# Patient Record
Sex: Male | Born: 1960 | Race: White | Hispanic: No | Marital: Single | Smoking: Current every day smoker
Health system: Southern US, Community
[De-identification: ages and names within clinical notes are randomized; demographics above are authoritative.]

## PROBLEM LIST (undated history)

## (undated) DIAGNOSIS — F209 Schizophrenia, unspecified: Secondary | ICD-10-CM

## (undated) DIAGNOSIS — I10 Essential (primary) hypertension: Secondary | ICD-10-CM

## (undated) HISTORY — PX: AMPUTATION ARM: SHX6593

---

## 2004-06-10 ENCOUNTER — Emergency Department: Payer: Self-pay | Admitting: Emergency Medicine

## 2004-06-12 ENCOUNTER — Emergency Department: Payer: Self-pay | Admitting: Emergency Medicine

## 2004-07-19 ENCOUNTER — Emergency Department: Payer: Self-pay | Admitting: Emergency Medicine

## 2005-05-25 ENCOUNTER — Emergency Department: Payer: Self-pay | Admitting: Emergency Medicine

## 2005-05-27 ENCOUNTER — Ambulatory Visit: Payer: Self-pay | Admitting: Emergency Medicine

## 2006-04-22 ENCOUNTER — Emergency Department: Payer: Self-pay | Admitting: Emergency Medicine

## 2006-06-01 ENCOUNTER — Emergency Department: Payer: Self-pay | Admitting: Emergency Medicine

## 2006-09-11 ENCOUNTER — Emergency Department: Payer: Self-pay | Admitting: Emergency Medicine

## 2006-10-12 ENCOUNTER — Emergency Department: Payer: Self-pay | Admitting: Emergency Medicine

## 2006-12-30 ENCOUNTER — Emergency Department: Payer: Self-pay | Admitting: Emergency Medicine

## 2007-06-05 ENCOUNTER — Emergency Department: Payer: Self-pay | Admitting: Emergency Medicine

## 2007-06-29 ENCOUNTER — Emergency Department: Payer: Self-pay | Admitting: Emergency Medicine

## 2008-05-16 ENCOUNTER — Emergency Department: Payer: Self-pay | Admitting: Emergency Medicine

## 2008-05-26 ENCOUNTER — Inpatient Hospital Stay: Payer: Self-pay | Admitting: Psychiatry

## 2008-06-21 ENCOUNTER — Inpatient Hospital Stay: Payer: Self-pay | Admitting: Psychiatry

## 2008-08-05 ENCOUNTER — Emergency Department: Payer: Self-pay

## 2009-08-24 ENCOUNTER — Inpatient Hospital Stay: Payer: Self-pay | Admitting: Psychiatry

## 2009-08-31 ENCOUNTER — Inpatient Hospital Stay: Payer: Self-pay | Admitting: Psychiatry

## 2010-08-23 ENCOUNTER — Inpatient Hospital Stay: Payer: Self-pay | Admitting: Psychiatry

## 2011-09-09 LAB — COMPREHENSIVE METABOLIC PANEL
Albumin: 3.7 g/dL (ref 3.4–5.0)
Alkaline Phosphatase: 68 U/L (ref 50–136)
Anion Gap: 10 (ref 7–16)
Co2: 24 mmol/L (ref 21–32)
EGFR (Non-African Amer.): >60
Glucose: 159 mg/dL — ABNORMAL HIGH (ref 65–99)
SGOT(AST): 9 U/L — ABNORMAL LOW (ref 15–37)
SGPT (ALT): 15 U/L (ref 12–78)
Total Protein: 7 g/dL (ref 6.4–8.2)

## 2011-09-09 LAB — URINALYSIS, COMPLETE
Bacteria: NONE SEEN
Leukocyte Esterase: NEGATIVE
Nitrite: NEGATIVE
Ph: 5 (ref 4.5–8.0)
Protein: NEGATIVE
RBC,UR: <1 /HPF (ref 0–5)
Specific Gravity: 1.025 (ref 1.003–1.030)

## 2011-09-09 LAB — DRUG SCREEN, URINE
Barbiturates, Ur Screen: NEGATIVE (ref ?–200)
Benzodiazepine, Ur Scrn: NEGATIVE (ref ?–200)
Cannabinoid 50 Ng, Ur ~~LOC~~: NEGATIVE (ref ?–50)
Cocaine Metabolite,Ur ~~LOC~~: NEGATIVE (ref ?–300)
MDMA (Ecstasy)Ur Screen: NEGATIVE (ref ?–500)
Opiate, Ur Screen: NEGATIVE (ref ?–300)
Phencyclidine (PCP) Ur S: NEGATIVE (ref ?–25)
Tricyclic, Ur Screen: NEGATIVE (ref ?–1000)

## 2011-09-09 LAB — CBC
HCT: 43.5 % (ref 40.0–52.0)
MCH: 30.8 pg (ref 26.0–34.0)
MCHC: 35.4 g/dL (ref 32.0–36.0)
Platelet: 262 10*3/uL (ref 150–440)
RDW: 14.2 % (ref 11.5–14.5)

## 2011-09-09 LAB — ETHANOL
Ethanol %: <0.003 % (ref 0.000–0.080)
Ethanol: <3 mg/dL

## 2011-09-09 LAB — TSH: Thyroid Stimulating Horm: 0.6 u[IU]/mL

## 2011-09-10 ENCOUNTER — Inpatient Hospital Stay: Payer: Self-pay | Admitting: Psychiatry

## 2011-09-10 LAB — DIFFERENTIAL
Basophil #: 0 10*3/uL (ref 0.0–0.1)
Basophil %: 0.5 %
Eosinophil #: 0 10*3/uL (ref 0.0–0.7)
Eosinophil %: 0.1 %
Lymphocyte #: 2.6 10*3/uL (ref 1.0–3.6)
Monocyte #: 0.7 x10 3/mm (ref 0.2–1.0)
Neutrophil %: 62.3 %

## 2011-09-10 LAB — CBC
MCH: 30.7 pg (ref 26.0–34.0)
MCV: 87 fL (ref 80–100)
Platelet: 223 10*3/uL (ref 150–440)
RBC: 4.61 10*6/uL (ref 4.40–5.90)
RDW: 13.6 % (ref 11.5–14.5)
WBC: 8.9 10*3/uL (ref 3.8–10.6)

## 2011-09-10 LAB — VALPROIC ACID LEVEL: Valproic Acid: 62 ug/mL

## 2011-09-17 LAB — WBC: WBC: 7.6 10*3/uL (ref 3.8–10.6)

## 2011-09-17 LAB — DIFFERENTIAL
Basophil #: 0 10*3/uL (ref 0.0–0.1)
Basophil %: 0.2 %
Eosinophil #: 0 10*3/uL (ref 0.0–0.7)
Eosinophil %: 0 %
Lymphocyte %: 23.8 %
Monocyte %: 14 %
Neutrophil #: 4.7 10*3/uL (ref 1.4–6.5)

## 2011-11-20 ENCOUNTER — Emergency Department: Payer: Self-pay | Admitting: Unknown Physician Specialty

## 2011-11-20 LAB — DRUG SCREEN, URINE
Amphetamines, Ur Screen: NEGATIVE (ref ?–1000)
Barbiturates, Ur Screen: NEGATIVE (ref ?–200)
Benzodiazepine, Ur Scrn: NEGATIVE (ref ?–200)
Cannabinoid 50 Ng, Ur ~~LOC~~: NEGATIVE (ref ?–50)
MDMA (Ecstasy)Ur Screen: NEGATIVE (ref ?–500)
Phencyclidine (PCP) Ur S: NEGATIVE (ref ?–25)
Tricyclic, Ur Screen: NEGATIVE (ref ?–1000)

## 2011-11-20 LAB — ETHANOL: Ethanol: <3 mg/dL

## 2011-11-20 LAB — COMPREHENSIVE METABOLIC PANEL
Albumin: 4 g/dL (ref 3.4–5.0)
Alkaline Phosphatase: 77 U/L (ref 50–136)
Anion Gap: 10 (ref 7–16)
BUN: 10 mg/dL (ref 7–18)
Calcium, Total: 9.1 mg/dL (ref 8.5–10.1)
Glucose: 98 mg/dL (ref 65–99)
Osmolality: 262 (ref 275–301)
Potassium: 4.5 mmol/L (ref 3.5–5.1)
SGOT(AST): 5 U/L — ABNORMAL LOW (ref 15–37)
SGPT (ALT): 16 U/L (ref 12–78)
Sodium: 131 mmol/L — ABNORMAL LOW (ref 136–145)
Total Protein: 7.7 g/dL (ref 6.4–8.2)

## 2011-11-20 LAB — CBC
HCT: 44 % (ref 40.0–52.0)
MCH: 30.7 pg (ref 26.0–34.0)
MCHC: 35.4 g/dL (ref 32.0–36.0)
Platelet: 245 10*3/uL (ref 150–440)
RDW: 13.7 % (ref 11.5–14.5)
WBC: 11.2 10*3/uL — ABNORMAL HIGH (ref 3.8–10.6)

## 2011-11-20 LAB — ACETAMINOPHEN LEVEL: Acetaminophen: <2 ug/mL

## 2012-01-20 ENCOUNTER — Emergency Department: Payer: Self-pay | Admitting: Emergency Medicine

## 2012-01-20 LAB — URINALYSIS, COMPLETE
Bilirubin,UR: NEGATIVE
Blood: NEGATIVE
Glucose,UR: NEGATIVE mg/dL (ref 0–75)
Leukocyte Esterase: NEGATIVE
RBC,UR: NONE SEEN /HPF (ref 0–5)
Squamous Epithelial: <1

## 2012-01-20 LAB — COMPREHENSIVE METABOLIC PANEL
Alkaline Phosphatase: 73 U/L (ref 50–136)
Anion Gap: 10 (ref 7–16)
Calcium, Total: 9.1 mg/dL (ref 8.5–10.1)
Chloride: 103 mmol/L (ref 98–107)
Co2: 22 mmol/L (ref 21–32)
EGFR (African American): >60
EGFR (Non-African Amer.): >60
Glucose: 96 mg/dL (ref 65–99)
Osmolality: 272 (ref 275–301)
Sodium: 135 mmol/L — ABNORMAL LOW (ref 136–145)

## 2012-01-20 LAB — CBC
HCT: 43.8 % (ref 40.0–52.0)
HGB: 15.5 g/dL (ref 13.0–18.0)
MCH: 30.9 pg (ref 26.0–34.0)
MCHC: 35.3 g/dL (ref 32.0–36.0)
MCV: 88 fL (ref 80–100)
RDW: 13.7 % (ref 11.5–14.5)

## 2012-01-20 LAB — TSH: Thyroid Stimulating Horm: 0.52 u[IU]/mL

## 2012-01-20 LAB — VALPROIC ACID LEVEL: Valproic Acid: 106 ug/mL — ABNORMAL HIGH

## 2012-01-20 LAB — DRUG SCREEN, URINE
Barbiturates, Ur Screen: NEGATIVE (ref ?–200)
Cannabinoid 50 Ng, Ur ~~LOC~~: NEGATIVE (ref ?–50)
Cocaine Metabolite,Ur ~~LOC~~: NEGATIVE (ref ?–300)
Methadone, Ur Screen: NEGATIVE (ref ?–300)
Opiate, Ur Screen: NEGATIVE (ref ?–300)
Phencyclidine (PCP) Ur S: NEGATIVE (ref ?–25)
Tricyclic, Ur Screen: NEGATIVE (ref ?–1000)

## 2012-01-20 LAB — ETHANOL
Ethanol %: <0.003 % (ref 0.000–0.080)
Ethanol: <3 mg/dL

## 2012-03-04 ENCOUNTER — Emergency Department: Payer: Self-pay | Admitting: Emergency Medicine

## 2012-03-04 LAB — COMPREHENSIVE METABOLIC PANEL
Albumin: 3.7 g/dL (ref 3.4–5.0)
Alkaline Phosphatase: 69 U/L (ref 50–136)
Calcium, Total: 8.8 mg/dL (ref 8.5–10.1)
Chloride: 102 mmol/L (ref 98–107)
Creatinine: 0.87 mg/dL (ref 0.60–1.30)
EGFR (Non-African Amer.): >60
Glucose: 108 mg/dL — ABNORMAL HIGH (ref 65–99)
Osmolality: 266 (ref 275–301)
Potassium: 4 mmol/L (ref 3.5–5.1)
SGOT(AST): 15 U/L (ref 15–37)
SGPT (ALT): 18 U/L (ref 12–78)

## 2012-03-04 LAB — DRUG SCREEN, URINE
Barbiturates, Ur Screen: NEGATIVE (ref ?–200)
Benzodiazepine, Ur Scrn: NEGATIVE (ref ?–200)
Cannabinoid 50 Ng, Ur ~~LOC~~: NEGATIVE (ref ?–50)
MDMA (Ecstasy)Ur Screen: NEGATIVE (ref ?–500)
Opiate, Ur Screen: NEGATIVE (ref ?–300)
Tricyclic, Ur Screen: NEGATIVE (ref ?–1000)

## 2012-03-04 LAB — CBC
MCHC: 35.3 g/dL (ref 32.0–36.0)
MCV: 87 fL (ref 80–100)
RBC: 5.09 10*6/uL (ref 4.40–5.90)
WBC: 8.8 10*3/uL (ref 3.8–10.6)

## 2012-03-04 LAB — TSH: Thyroid Stimulating Horm: 0.59 u[IU]/mL

## 2012-03-04 LAB — ETHANOL
Ethanol %: <0.003 % (ref 0.000–0.080)
Ethanol: <3 mg/dL

## 2012-03-04 LAB — SALICYLATE LEVEL: Salicylates, Serum: 2.6 mg/dL

## 2012-03-05 LAB — VALPROIC ACID LEVEL: Valproic Acid: 59 ug/mL

## 2012-03-19 ENCOUNTER — Emergency Department: Payer: Self-pay | Admitting: Internal Medicine

## 2012-03-19 LAB — COMPREHENSIVE METABOLIC PANEL
Alkaline Phosphatase: 69 U/L (ref 50–136)
Anion Gap: 8 (ref 7–16)
BUN: 6 mg/dL — ABNORMAL LOW (ref 7–18)
Calcium, Total: 8.4 mg/dL — ABNORMAL LOW (ref 8.5–10.1)
Chloride: 98 mmol/L (ref 98–107)
Co2: 25 mmol/L (ref 21–32)
Creatinine: 0.78 mg/dL (ref 0.60–1.30)
EGFR (African American): >60
EGFR (Non-African Amer.): >60
Glucose: 131 mg/dL — ABNORMAL HIGH (ref 65–99)
Potassium: 4.1 mmol/L (ref 3.5–5.1)
SGPT (ALT): 18 U/L (ref 12–78)
Sodium: 131 mmol/L — ABNORMAL LOW (ref 136–145)
Total Protein: 7.2 g/dL (ref 6.4–8.2)

## 2012-03-19 LAB — CBC
MCH: 29.9 pg (ref 26.0–34.0)
MCHC: 33.8 g/dL (ref 32.0–36.0)
RBC: 4.97 10*6/uL (ref 4.40–5.90)
RDW: 13.8 % (ref 11.5–14.5)

## 2012-03-19 LAB — TSH: Thyroid Stimulating Horm: 1.21 u[IU]/mL

## 2012-03-20 LAB — DRUG SCREEN, URINE
Amphetamines, Ur Screen: NEGATIVE (ref ?–1000)
Barbiturates, Ur Screen: NEGATIVE (ref ?–200)
Benzodiazepine, Ur Scrn: NEGATIVE (ref ?–200)
MDMA (Ecstasy)Ur Screen: NEGATIVE (ref ?–500)
Opiate, Ur Screen: NEGATIVE (ref ?–300)
Phencyclidine (PCP) Ur S: NEGATIVE (ref ?–25)
Tricyclic, Ur Screen: NEGATIVE (ref ?–1000)

## 2012-03-20 LAB — URINALYSIS, COMPLETE
Bilirubin,UR: NEGATIVE
Leukocyte Esterase: NEGATIVE
Nitrite: NEGATIVE
Ph: 8 (ref 4.5–8.0)
Protein: NEGATIVE
Specific Gravity: 1.008 (ref 1.003–1.030)
Squamous Epithelial: NONE SEEN
WBC UR: <1 /HPF (ref 0–5)

## 2012-03-20 LAB — SALICYLATE LEVEL: Salicylates, Serum: 2.3 mg/dL

## 2012-03-20 LAB — ACETAMINOPHEN LEVEL: Acetaminophen: <2 ug/mL

## 2012-03-21 LAB — DIFFERENTIAL
Basophil #: 0.1 10*3/uL (ref 0.0–0.1)
Basophil %: 1.6 %
Eosinophil %: 0 %
Lymphocyte %: 35.7 %
Monocyte %: 10.5 %
Neutrophil #: 3.2 10*3/uL (ref 1.4–6.5)
Neutrophil %: 52.2 %

## 2012-03-21 LAB — WBC: WBC: 6.2 10*3/uL (ref 3.8–10.6)

## 2012-03-23 LAB — VALPROIC ACID LEVEL: Valproic Acid: 77 ug/mL

## 2012-06-14 ENCOUNTER — Emergency Department: Payer: Self-pay | Admitting: Emergency Medicine

## 2012-06-14 LAB — CBC
HGB: 14.7 g/dL (ref 13.0–18.0)
MCV: 86 fL (ref 80–100)
Platelet: 235 10*3/uL (ref 150–440)
RBC: 4.91 10*6/uL (ref 4.40–5.90)
RDW: 13.2 % (ref 11.5–14.5)

## 2012-06-14 LAB — DRUG SCREEN, URINE
Amphetamines, Ur Screen: NEGATIVE (ref ?–1000)
Barbiturates, Ur Screen: NEGATIVE (ref ?–200)
Benzodiazepine, Ur Scrn: NEGATIVE (ref ?–200)
Cocaine Metabolite,Ur ~~LOC~~: NEGATIVE (ref ?–300)
MDMA (Ecstasy)Ur Screen: NEGATIVE (ref ?–500)
Methadone, Ur Screen: NEGATIVE (ref ?–300)
Opiate, Ur Screen: NEGATIVE (ref ?–300)
Phencyclidine (PCP) Ur S: NEGATIVE (ref ?–25)
Tricyclic, Ur Screen: NEGATIVE (ref ?–1000)

## 2012-06-14 LAB — COMPREHENSIVE METABOLIC PANEL
Albumin: 3.6 g/dL (ref 3.4–5.0)
Alkaline Phosphatase: 75 U/L (ref 50–136)
Anion Gap: 7 (ref 7–16)
BUN: 9 mg/dL (ref 7–18)
Calcium, Total: 8.8 mg/dL (ref 8.5–10.1)
Chloride: 94 mmol/L — ABNORMAL LOW (ref 98–107)
Creatinine: 0.69 mg/dL (ref 0.60–1.30)
EGFR (African American): >60
Glucose: 121 mg/dL — ABNORMAL HIGH (ref 65–99)
Potassium: 4.3 mmol/L (ref 3.5–5.1)
SGOT(AST): 17 U/L (ref 15–37)
SGPT (ALT): 19 U/L (ref 12–78)
Total Protein: 6.9 g/dL (ref 6.4–8.2)

## 2012-06-14 LAB — ETHANOL
Ethanol %: <0.003 % (ref 0.000–0.080)
Ethanol: <3 mg/dL

## 2012-06-14 LAB — VALPROIC ACID LEVEL: Valproic Acid: 69 ug/mL

## 2012-06-15 LAB — BASIC METABOLIC PANEL
BUN: 8 mg/dL (ref 7–18)
Calcium, Total: 8.6 mg/dL (ref 8.5–10.1)
Calcium, Total: 8.5 mg/dL (ref 8.5–10.1)
Chloride: 97 mmol/L — ABNORMAL LOW (ref 98–107)
Chloride: 99 mmol/L (ref 98–107)
Co2: 25 mmol/L (ref 21–32)
Creatinine: 0.67 mg/dL (ref 0.60–1.30)
Creatinine: 0.6 mg/dL (ref 0.60–1.30)
EGFR (African American): >60
EGFR (Non-African Amer.): >60
Glucose: 89 mg/dL (ref 65–99)
Osmolality: 256 (ref 275–301)
Osmolality: 259 (ref 275–301)
Potassium: 4.2 mmol/L (ref 3.5–5.1)
Potassium: 4.4 mmol/L (ref 3.5–5.1)
Sodium: 129 mmol/L — ABNORMAL LOW (ref 136–145)
Sodium: 130 mmol/L — ABNORMAL LOW (ref 136–145)

## 2012-06-15 LAB — CBC WITH DIFFERENTIAL/PLATELET
Basophil #: 0.1 10*3/uL (ref 0.0–0.1)
Eosinophil %: 0.1 %
HCT: 39.7 % — ABNORMAL LOW (ref 40.0–52.0)
HGB: 14.2 g/dL (ref 13.0–18.0)
Lymphocyte %: 23.8 %
MCH: 30.6 pg (ref 26.0–34.0)
MCHC: 35.8 g/dL (ref 32.0–36.0)
Monocyte #: 0.9 x10 3/mm (ref 0.2–1.0)
Monocyte %: 8.6 %
Neutrophil #: 6.6 10*3/uL — ABNORMAL HIGH (ref 1.4–6.5)
Platelet: 203 10*3/uL (ref 150–440)
RBC: 4.64 10*6/uL (ref 4.40–5.90)
RDW: 13.3 % (ref 11.5–14.5)

## 2012-07-10 ENCOUNTER — Emergency Department: Payer: Self-pay | Admitting: Emergency Medicine

## 2012-07-10 LAB — COMPREHENSIVE METABOLIC PANEL
Albumin: 3.6 g/dL (ref 3.4–5.0)
Bilirubin,Total: 0.3 mg/dL (ref 0.2–1.0)
EGFR (African American): >60
Glucose: 104 mg/dL — ABNORMAL HIGH (ref 65–99)
Osmolality: 252 (ref 275–301)
Potassium: 4.2 mmol/L (ref 3.5–5.1)
SGPT (ALT): 17 U/L (ref 12–78)

## 2012-07-10 LAB — CBC
HCT: 40.6 % (ref 40.0–52.0)
HGB: 14.3 g/dL (ref 13.0–18.0)
MCHC: 35.3 g/dL (ref 32.0–36.0)
MCV: 87 fL (ref 80–100)
Platelet: 228 10*3/uL (ref 150–440)

## 2012-07-10 LAB — SALICYLATE LEVEL: Salicylates, Serum: 1.7 mg/dL

## 2012-07-10 LAB — DRUG SCREEN, URINE
Amphetamines, Ur Screen: NEGATIVE (ref ?–1000)
Barbiturates, Ur Screen: NEGATIVE (ref ?–200)
Cannabinoid 50 Ng, Ur ~~LOC~~: NEGATIVE (ref ?–50)
Methadone, Ur Screen: NEGATIVE (ref ?–300)
Phencyclidine (PCP) Ur S: NEGATIVE (ref ?–25)
Tricyclic, Ur Screen: NEGATIVE (ref ?–1000)

## 2012-07-10 LAB — TSH: Thyroid Stimulating Horm: 0.98 u[IU]/mL

## 2012-07-10 LAB — ETHANOL: Ethanol: <3 mg/dL

## 2012-07-12 LAB — DIFFERENTIAL
Basophil #: 0 10*3/uL (ref 0.0–0.1)
Basophil %: 0.5 %
Eosinophil #: 0 10*3/uL (ref 0.0–0.7)
Eosinophil %: 0.1 %
Lymphocyte %: 33.8 %
Monocyte %: 10.8 %
Neutrophil #: 4.3 10*3/uL (ref 1.4–6.5)

## 2013-10-21 LAB — COMPREHENSIVE METABOLIC PANEL
ANION GAP: 7 (ref 7–16)
Albumin: 3.4 g/dL (ref 3.4–5.0)
Alkaline Phosphatase: 59 U/L
BUN: 9 mg/dL (ref 7–18)
Bilirubin,Total: 0.3 mg/dL (ref 0.2–1.0)
CALCIUM: 8.4 mg/dL — AB (ref 8.5–10.1)
Chloride: 103 mmol/L (ref 98–107)
Co2: 25 mmol/L (ref 21–32)
Creatinine: 0.87 mg/dL (ref 0.60–1.30)
EGFR (African American): >60
EGFR (Non-African Amer.): >60
Glucose: 144 mg/dL — ABNORMAL HIGH (ref 65–99)
OSMOLALITY: 271 (ref 275–301)
POTASSIUM: 3.9 mmol/L (ref 3.5–5.1)
SGOT(AST): 19 U/L (ref 15–37)
SGPT (ALT): 17 U/L
Sodium: 135 mmol/L — ABNORMAL LOW (ref 136–145)
TOTAL PROTEIN: 6.7 g/dL (ref 6.4–8.2)

## 2013-10-21 LAB — ACETAMINOPHEN LEVEL

## 2013-10-21 LAB — SALICYLATE LEVEL: Salicylates, Serum: 1.7 mg/dL

## 2013-10-21 LAB — DRUG SCREEN, URINE

## 2013-10-21 LAB — CBC
HCT: 42 % (ref 40.0–52.0)
HGB: 14.7 g/dL (ref 13.0–18.0)
MCH: 31.4 pg (ref 26.0–34.0)
MCHC: 34.9 g/dL (ref 32.0–36.0)
MCV: 90 fL (ref 80–100)
Platelet: 190 10*3/uL (ref 150–440)
RBC: 4.66 10*6/uL (ref 4.40–5.90)
RDW: 14.4 % (ref 11.5–14.5)
WBC: 7.8 10*3/uL (ref 3.8–10.6)

## 2013-10-21 LAB — URINALYSIS, COMPLETE
BACTERIA: NONE SEEN
BILIRUBIN, UR: NEGATIVE
BLOOD: NEGATIVE
Glucose,UR: NEGATIVE mg/dL (ref 0–75)
Nitrite: NEGATIVE
PH: 7 (ref 4.5–8.0)
Protein: 30
Specific Gravity: 1.021 (ref 1.003–1.030)
Squamous Epithelial: <1
WBC UR: 9 /HPF (ref 0–5)

## 2013-10-21 LAB — ETHANOL: Ethanol: <3 mg/dL

## 2013-10-22 ENCOUNTER — Inpatient Hospital Stay: Payer: Self-pay | Admitting: Psychiatry

## 2013-10-22 LAB — VALPROIC ACID LEVEL: VALPROIC ACID: 83 ug/mL

## 2013-10-23 LAB — CBC WITH DIFFERENTIAL/PLATELET
BASOS ABS: 0 10*3/uL (ref 0.0–0.1)
Basophil %: 0.6 %
EOS ABS: 0 10*3/uL (ref 0.0–0.7)
EOS PCT: 0.2 %
HCT: 39.9 % — ABNORMAL LOW (ref 40.0–52.0)
HGB: 13.2 g/dL (ref 13.0–18.0)
LYMPHS ABS: 2.8 10*3/uL (ref 1.0–3.6)
LYMPHS PCT: 40.6 %
MCH: 30.1 pg (ref 26.0–34.0)
MCHC: 33.1 g/dL (ref 32.0–36.0)
MCV: 91 fL (ref 80–100)
MONO ABS: 0.7 x10 3/mm (ref 0.2–1.0)
Monocyte %: 9.6 %
Neutrophil #: 3.4 10*3/uL (ref 1.4–6.5)
Neutrophil %: 49 %
Platelet: 170 10*3/uL (ref 150–440)
RBC: 4.39 10*6/uL — ABNORMAL LOW (ref 4.40–5.90)
RDW: 14.3 % (ref 11.5–14.5)
WBC: 6.9 10*3/uL (ref 3.8–10.6)

## 2014-03-01 ENCOUNTER — Inpatient Hospital Stay: Payer: Self-pay | Admitting: Psychiatry

## 2014-04-16 ENCOUNTER — Emergency Department: Payer: Self-pay | Admitting: Emergency Medicine

## 2014-04-17 LAB — COMPREHENSIVE METABOLIC PANEL
ALK PHOS: 53 U/L
Albumin: 3.9 g/dL
Anion Gap: 9 (ref 7–16)
BILIRUBIN TOTAL: 0.4 mg/dL
BUN: 8 mg/dL
CREATININE: 0.64 mg/dL
Calcium, Total: 8.7 mg/dL — ABNORMAL LOW
Chloride: 96 mmol/L — ABNORMAL LOW
Co2: 25 mmol/L
EGFR (Non-African Amer.): >60
Glucose: 120 mg/dL — ABNORMAL HIGH
Potassium: 4.1 mmol/L
SGOT(AST): 14 U/L — ABNORMAL LOW
SGPT (ALT): 8 U/L — ABNORMAL LOW
Sodium: 130 mmol/L — ABNORMAL LOW
TOTAL PROTEIN: 6.5 g/dL

## 2014-04-17 LAB — URINALYSIS, COMPLETE
Bacteria: NONE SEEN
Bilirubin,UR: NEGATIVE
Blood: NEGATIVE
Glucose,UR: NEGATIVE mg/dL (ref 0–75)
Ketone: NEGATIVE
Leukocyte Esterase: NEGATIVE
Nitrite: NEGATIVE
PROTEIN: NEGATIVE
Ph: 6 (ref 4.5–8.0)
RBC,UR: NONE SEEN /HPF (ref 0–5)
SPECIFIC GRAVITY: 1.011 (ref 1.003–1.030)
Squamous Epithelial: <1

## 2014-04-17 LAB — CBC
HCT: 42.4 % (ref 40.0–52.0)
HGB: 14.4 g/dL (ref 13.0–18.0)
MCH: 29.9 pg (ref 26.0–34.0)
MCHC: 33.9 g/dL (ref 32.0–36.0)
MCV: 88 fL (ref 80–100)
Platelet: 228 10*3/uL (ref 150–440)
RBC: 4.81 10*6/uL (ref 4.40–5.90)
RDW: 14.3 % (ref 11.5–14.5)
WBC: 9.2 10*3/uL (ref 3.8–10.6)

## 2014-04-17 LAB — DRUG SCREEN, URINE
AMPHETAMINES, UR SCREEN: NEGATIVE
BENZODIAZEPINE, UR SCRN: NEGATIVE
Barbiturates, Ur Screen: NEGATIVE
Cannabinoid 50 Ng, Ur ~~LOC~~: NEGATIVE
Cocaine Metabolite,Ur ~~LOC~~: NEGATIVE
MDMA (Ecstasy)Ur Screen: NEGATIVE
METHADONE, UR SCREEN: NEGATIVE
OPIATE, UR SCREEN: NEGATIVE
Phencyclidine (PCP) Ur S: NEGATIVE
Tricyclic, Ur Screen: NEGATIVE

## 2014-04-17 LAB — ACETAMINOPHEN LEVEL: Acetaminophen: <10 ug/mL

## 2014-04-17 LAB — ETHANOL

## 2014-04-17 LAB — SALICYLATE LEVEL: Salicylates, Serum: <4 mg/dL
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
## 2014-05-13 NOTE — Consult Note (Signed)
Brief Consult Note: Diagnosis: schizoaffective disorder.   Patient was seen by consultant.   Recommend further assessment or treatment.   Orders entered.   Comments: Psychiatry: Patient seen. Chart reviewed.[facility] and is adamant that he wants to go home with his mother. His is being followed by the ACT team in outpt.  case discussed with Dr Christian Matelpacs who is concerned about his behavior and wants continued placement for [facility].  No change in meds.  Electronic Signatures: Rhunette CroftFaheem, Uzma S (MD)  (Signed 04-Mar-14 13:35)  Authored: Brief Consult Note   Last Updated: 04-Mar-14 13:35 by Rhunette CroftFaheem, Uzma S (MD)
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
## 2014-05-13 NOTE — Consult Note (Signed)
Brief Consult Note: Diagnosis: Schizoaffective disorder bipolar type.   Patient was seen by consultant.   Consult note dictated.   Recommend further assessment or treatment.   Orders entered.   Discussed with Attending MD.   Comments: [name] has a h/o schizoaffective disorder. He is in the care of Dr. Cherylann RatelLateef and [facility] ACT team. He has been compliant with medications as evidenced by therapeutic level of Depakote. He came to ER feeling unsafe without intention or a plan.   He is no longer suicidal or homicidal. He is cool and collected. The mother is OK with the patient returning home.  PLAN: 1. The patient no longer meets criteria for IVC./ I will terminate proceedings. Please discharge as appropriate.  2. He is to continue all medications without a change. No Rx necessary.  3. He will follow up with Dr. Cherylann RatelLateef and ACT team.  4. His mother is unable to drive him home. He will need a taxi.  Electronic Signatures: Kristine LineaPucilowska, Jeffrey [name] (MD)  (Signed 14-Feb-14 11:15)  Authored: Brief Consult Note   Last Updated: 14-Feb-14 11:15 by Kristine LineaPucilowska, Nasiah Polinsky (MD)

## 2014-05-13 NOTE — Consult Note (Signed)
Brief Consult Note: Diagnosis: schizoaffective disorder.   Patient was seen by consultant.   Recommend further assessment or treatment.   Orders entered.   Comments: Psychiatry: Patient seen. Chart reviewed. He has been doing well and he is not exhibiting any acute psychotic or mood symptoms. He is compliant with his meds. He reported that his mother visits with him everyday and she is willing to take him back. He is following with [facility] team. No SI/HI noted.   Plan;  Pt will be discharged in stable condition.  Will d/c IVC Continue meds and will continue to follow up with his ACT team.  Electronic Signatures: Rhunette CroftFaheem, Uzma S (MD)  (Signed 06-Mar-14 09:51)  Authored: Brief Consult Note   Last Updated: 06-Mar-14 09:51 by Rhunette CroftFaheem, Uzma S (MD)

## 2014-05-13 NOTE — Consult Note (Signed)
Brief Consult Note: Diagnosis: schizoaffective disorder.   Patient was seen by consultant.   Consult note dictated.   Recommend further assessment or treatment.   Orders entered.   Comments: Psychiatry: Patient seen. Chart reviewed. Continues to voice suicidal ideation. History of severe suicidal behavior. Will increase clozaril and depakote doses and reevaluate in the next couple days.  Electronic Signatures: Audery Amellapacs, John T (MD)  (Signed 28-Feb-14 18:57)  Authored: Brief Consult Note   Last Updated: 28-Feb-14 18:57 by Audery Amellapacs, John T (MD)

## 2014-05-13 NOTE — Consult Note (Signed)
PATIENT NAME:  Jeffrey [name], Adewale T MR#:  [id number] DATE OF BIRTH:  [birth year]  DATE OF CONSULTATION:  07/11/2012  REFERRING PHYSICIAN:    CONSULTING PHYSICIAN:  Amberly Livas K. Guss Bundehalla, MD  PLACE OF DICTATION:  [facility], [facility], [place].  AGE:  54.  SEX:  Male.  RACE:  White.  The patient was seen in [facility].  The patient is a 54 year old white male with a long history of mental illness and currently not employed and not married and lives with his mother who is 53 years old.  The patient was [facility] with a chief complaint "I am scared of objects and lethal objects to hurt myself, can't escape the thoughts" according to the mother via the phone.  In addition, she reported "I was moving out in the yard and he came up to me saying, 'you got to make me to the hospital, I am afraid of hurting myself.'  He cannot fix his train of thoughts."    HISTORY OF PRESENT ILLNESS:  The patient has a long history of mental illness and schizoaffective disorder and has been on medications like Clozaril, Depakote and Ativan.  The patient reports he is being followed by [facility] and gets to see him every 2 weeks.  The last appointment was a week ago and next appointment comes up in one week.    ALCOHOL AND DRUGS:  Denies drinking alcohol.  Denies street or prescription drug abuse.  Does admit smoking nicotine cigarettes at a rate of 1-1/2 packs a day for many years.  MENTAL STATUS EXAMINATION:  The patient is alert and oriented, aware of the situation that brought him for admission.  Admits feeling paranoid and admits that he is afraid of hard objects around and pointed to stuff around.  Does admit feeling low and coming in depressed because he is afraid of the hard objects around.  Does not appear to be actively responding to internal stimuli.  Admits that he knows where he is and the day, date, time, place, person, season and situation.  Denies any ideas of plans to hurt himself or  others and said not now, and he wants help, but he is still afraid of hard objects and being hurt by them.  Cognition is intact.  Insight and judgment guarded.  IMPRESSION:  Schizoaffective disorder with psychosis.  PLAN:  Continue current medications, that is simvastatin 40 mg every day, clozapine 400 mg once a day at bedtime, Colace 200 mg twice a day, MiraLAX 17 grams once a day, Ativan 1 mg one p.o. q. 8 hours, Clozaril 100 mg one tablet once a day in the morning, Depakote 1000 mg once a day, 1000 mg at bedtime, that is 1000 mg twice a day.    The patient will be evaluated by Mr. Rudell CobbKent on Monday morning, that is 07/13/2012 and if he is stable he can go back to his mother.    ____________________________ Jannet MantisSurya K. Guss Bundehalla, MD skc:ea D: 07/11/2012 20:58:52 ET T: 07/11/2012 23:11:24 ET JOB#: [id number]  cc: Monika SalkSurya K. Guss Bundehalla, MD, <Dictator> Beau FannySURYA K Khan Chura MD ELECTRONICALLY SIGNED 07/12/2012 18:28
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
## 2014-05-22 NOTE — Consult Note (Signed)
PATIENT NAME:  Jeffrey [name], Jeffrey [name] MR#:  [id number] DATE OF BIRTH:  07/25/1960  DATE OF CONSULTATION:  04/17/2014  CONSULTING PHYSICIAN:  Demetrias Goodbar K. Mattye Verdone, MD  SUBJECTIVE: The patient was seen in [facility] bed [facility]. The patient is a 54 year old, white male who has never worked or worked a long time ago and was never married. He has been living with his mother and does not know her age. The patient is tired of living with the mother and got upset. He called the police and stated that there were spiders all over his living room and he wanted them to come and check it out and try to put them out.   CHIEF COMPLAINT: "I am tired of living with my mother. I got fed up and I thought she called them, I am not sure."   HISTORY OF PRESENT ILLNESS: The patient reports he gets into conflicts with mother sometimes.   PAST PSYCHIATRIC HISTORY: No previous history of inpatient hospital psychiatry. No history of suicide attempts. Not being followed by any psychiatrist. According to information obtained, the patient had been noncompliant with medications.  ALCOHOL AND DRUGS: Denies drinking alcohol. Denies street drugs or prescription drug abuse. Denies using street or prescription drugs. Does smoke nicotine cigarettes.   MENTAL STATUS: The patient is dressed in hospital clothes. Alert and oriented. Very irritable and upset that he has to go back to his mother, but he said he has no problem. He tries to get along with her to the best of his capacity. He was calm and quiet and he said he does not see any spiders. He just called the police because he was upset with his living condition. Currently, he is not psychotic and he denies auditory or visual hallucination, delusions or paranoid thinking. He is eager to go home. Denies suicidal or homicidal plans and contracts for safety. The patient stated that he will try to keep up his follow up appointments in the community as recommended. It was felt by  the undersigned and staff that the patient can be discharged home and he is a voluntary patient.   RECOMMEND: Discharge patient back home. Mother will come and pick him up and take to the mental clinic in the community. The patient contracts for safety and was eager to go home with his mother.    ____________________________ Jannet MantisSurya K. Guss Bundehalla, MD skc:TT D: 04/17/2014 16:52:37 ET [name]: 04/17/2014 17:07:53 ET JOB#: [id number]  cc: Monika SalkSurya K. Guss Bundehalla, MD, <Dictator> Beau FannySURYA K Luna Audia MD ELECTRONICALLY SIGNED 04/23/2014 18:20

## 2014-05-22 NOTE — Consult Note (Signed)
Brief Consult Note: Diagnosis: hyponatremia.   Patient was seen by consultant.   Consult note dictated.   Orders entered.   Comments: advise fluid restriction up to 1800 ml daily for now. recheck Na- will continue to follow.  Electronic Signatures: Altamese DillingVachhani, Annice Jolly (MD)  (Signed 11-Feb-16 15:58)  Authored: Brief Consult Note   Last Updated: 11-Feb-16 15:58 by Altamese DillingVachhani, Shanara Schnieders (MD)

## 2014-05-22 NOTE — Consult Note (Signed)
PATIENT NAME:  Jeffrey [name], Jeffrey [name] DATE OF BIRTH:  05-19-1960  DATE OF CONSULTATION:  03/03/2014  REFERRING PHYSICIAN:  Salvadore FarberAndrea Hernandez-Gonzales, MD  CONSULTING PHYSICIAN:  Hope PigeonVaibhavkumar G. Elisabeth PigeonVachhani, MD  REASON FOR CONSULTATION: Hyponatremia.   HISTORY OF PRESENT ILLNESS: This is a 54 year old male who is admitted to psychiatric floor for schizophrenia. A medical consult is called in because of hyponatremia. During my questioning, the patient denies any complaint, but he is not a very good cooperative candidate, tried to answer his questions with only 1 or 2 words and continued doing his work or walking or going to eat during my questioning, but he agrees that he drank a lot of water and coffee every day. He just gave me an example: Last night, he drank 5 cups of coffee and he also drinks juice and tea during having his lunch and dinner. He also drinks a little couple of water during the daytime; and he agrees that somebody in the past told him that his sodium level was low, but he does not remember who exactly was there and how long ago that was.   Review of systems.  CONSTITUTIONAL: Negative for fever, fatigue, weakness, pain, or weight loss.  EYES: No blurring, double vision, discharge, or redness.  EARS, NOSE, THROAT: No tinnitus, ear pain, or hearing loss.  RESPIRATORY: No cough, wheezing, hemoptysis, or shortness of breath.  CARDIOVASCULAR: No chest pain, orthopnea, edema, arrhythmia, palpitation.  GASTROINTESTINAL: No nausea, vomiting, diarrhea, abdominal pain.  GENITOURINARY: No dysuria, hematuria, increased frequency.  ENDOCRINE: No heat or cold intolerance. No excessive sweating.  SKIN: No acne, rashes, or lesions.  MUSCULOSKELETAL: No pain or swelling in the joints.  NEUROLOGICAL: No numbness, weakness, tremor, or vertigo.  PSYCHIATRIC: The patient is admitted for schizophrenia on psychiatry floor.   PAST MEDICAL HISTORY: Denies any medical history of hypertension,  diabetes, or cardiac issues.  PAST SURGICAL HISTORY: None.   SOCIAL HISTORY: He smokes 1 pack of cigarettes a day. He denies drinking alcohol or doing any illegal drug use.   FAMILY HISTORY: He denies any history of hypertension or diabetes in family.   HOME MEDICATIONS: He says he was not taking any medication before he was admitted to psychiatry floor.   CURRENT MEDICATIONS: Currently on psychiatry floor, he is getting:  1. Atenolol 50 mg daily,  2. Clozapine 100 mg daily.  3. Divalproex sodium 1000 mg daily.  4. Docusate sodium 200 mg daily.  5. Senna 2 tablets at bedtime.  6. Simvastatin 40 mg at bedtime.   PHYSICAL EXAMINATION: VITAL SIGNS: Temperature 98, pulse 108, respirations 18, blood pressure 135/77, and pulse oximetry is 98% on room air.  GENERAL: The patient is fully alert and oriented to time, place, and person. Does not appear in any acute distress.  HEENT: Head and neck atraumatic. Conjunctivae pink. Oral mucosa moist.  NECK: Supple. No JVD.  RESPIRATORY: Bilateral equal and clear air entry.  CARDIOVASCULAR: S1, S2 present, regular. No murmur.  ENDOCRINE: Thyroid nontender.  ABDOMEN: Soft, nontender. Bowel sounds present. No organomegaly.  SKIN: No acne, rashes, or lesions.  LEGS: No edema.  NEUROLOGICAL: Power 5/5. Follows commands. No tremor or rigidity. Sensation is intact. MUSCULOSKELETAL: Joints: No tenderness or swelling.   IMPORTANT LABORATORY RESULTS: Glucose 98, BUN 10, creatinine 0.75, sodium 127, which was 131 three days ago, potassium 3.9, chloride 92, CO2 28, osmolality 254, and calcium is 8.8. TSH is 1.39. Valproic acid level is 84. Salicylate level was 8.5 on admission. Acetaminophen  was less than 2.   ASSESSMENT AND PLAN: A 54 year old male who is admitted to psychiatry for schizophrenia. Medical consult is called in for hyponatremia. The patient confirms to be drinking a lot of liquids.  1. Hyponatremia: Most likely this is due to polydipsia. I would  strongly recommend to restrict his total liquid intake up to 1800 mL per day if that is possible. I encouraged the patient to stick to that, but maybe he might need some more supervision or recurrent reminder by the local stuff. I do not see any medications in his medication list in the last 2 or 3 days which could cause hyponatremia. His TSH level is also normal, so there does not seem any major other reasons for that; and the patient is asymptomatic so nothing to do at this point, rather than fluid restriction.  2. Schizophrenia: Manage as per the primary team.  3. Smoking: Smoking cessation counseling is done for 4 minutes. The patient is not interested, right now, on quitting his smoking habit.   CODE STATUS: Full code.   TOTAL TIME SPENT ON THIS CONSULTATION: 45 minutes.    ____________________________ Hope Pigeon Elisabeth Pigeon, MD vgv:mw D: 03/03/2014 16:06:58 ET [name]: 03/03/2014 16:26:33 ET JOB#: [id number]  cc: Hope Pigeon. Elisabeth Pigeon, MD, <Dictator> Altamese Dilling MD ELECTRONICALLY SIGNED 03/09/2014 16:12

## 2014-05-31 NOTE — H&P (Signed)
PATIENT NAME:  Jeffrey [name], [name] [id number] OF BIRTH:  Mar 04, 1960 OF ADMISSION:  03/02/2014 INFORMATION: This is a 54 year old man with a history of schizophrenia, who came into the hospital voluntarily.  COMPLAINT: "I'm tired of being at home."  OF PRESENT ILLNESS: patient refused to meet with me for assessment.  Per chart Jeffrey [name] is a 54 year old Caucasian male, who reports to the ED by way of the police.  He reported that he was aggravated at his mother and wanted to be away from her.  He denied being suicidal or homicidal. He denied being depressed or anxious. He denied having auditory or visual hallucinations.  He stated that he had not been aggressive in recent months.   mother reported that Jeffrey [name] has not been taking his medication as prescribed in recent months.  She states that he has a prior diagnosis of paranoid schizophrenia and that he believes someone is trying to do something to him. She expressed concern as she is in her 8970s and cannot defend herself against him when he becomes agitated. She further expressed that often she is the only one with him and fears that he believes she is the one who wants to do something to him. His mother reports that this evening he became agitated and jumped up so she left the home and went to find a Emergency planning/management officerpolice officer, after driving around, she reportedly returned home to find officers at her home. ABUSE HISTORY: no known h/o of substance abuse   PSYCHIATRIC HISTORY: History of schizophrenia, which at times has been severe enough that he actually cut off his left arm below the elbow in a fit of psychosis in the past. He is currently followed by an ACT team and takes clozapine, which has been of some benefit to him. History of suicidality, history of aggression in the past.  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 Chronic constipation related to his medicine, dyslipidemia. No other significant ongoing  medical problems.  HISTORY: Lives with his mother. Evidently, has really never stayed in a group home. Mother is obviously getting up in years and is feeling uncomfortable with him around the house. No other family that provides assistance to him that we can identify.  HISTORY: Denies knowing of any family history of mental illness.   MEDICATIONS: Clozapine 100 mg in the morning and 400 mg at night, Depakote 1000 mg in the morning and 1500 mg at night, docusate 100 mg at night, simvastatin 40 mg at night.  No known drug allergies.  OF SYSTEMS: unable to obtain as pt is uncooperative STATUS EXAMINATION: Disheveled gentleman who looks his stated age.  Behavior: uncooperative, dismissive. Eye contact: Poor. Psychomotor activity: wnl Speech: regular tone, volume and rate. Thoughts process unable to assess. Thought content unable to assess. Mood: irritable.  Affect: restricted.  Insight: poor, Judgement: poor exam: unable to complete as pt is uncooperative.  Pt is in no acute distress.147/94, P: 97, R: 20, T: 97.8 DATA: His absolute neutrophil count is 6.1. TSH normal. Drug screen all negative. Chemistry panel: Slightly low sodium at 131, nothing else remarkable.  I: Schizophrenia. II: No diagnosis. III: Status post amputation of the left arm, all healed up; dyslipidemia.  A 54 year old man with a history of schizophrenia. He has been escalating to the point of frightening his mother at home. His history indicates that he clearly has the potential to do serious violence to himself and to others, potentially.  Unclear if complaint with medications or not. Continue clozaril 100 mg po q am and 400 mg po qhscontinue Depakote 100 mg po q am and 1500 mg po qhscontinue Zocor 40 mg po q dayColace will be increased to 200 mg po bid and will add senna 2 tabs qhscontinue Ativan 2 mg prnuse d/o: will offer nicotine inhalerwill check Depakote level and clozaril level in order to r/o noncompliance vs lack of benefit from  current meds/dose.   Electronic Signatures: Hernandez-GonJimmy Footmanzalez, Temple Sporer (MD)  (Signed on 10-Feb-16 15:18)  Authored  Last Updated: 10-Feb-16 15:18 by Jimmy FootmanHernandez-Gonzalez, Iliany Losier (MD)

## 2014-05-31 NOTE — H&P (Signed)
awaiting dictation  Electronic Signatures: Lockie ParesBell, Temia Debroux L (MD)  (Signed on 03-Oct-15 23:16)  Authored  Last Updated: 03-Oct-15 23:16 by Lockie ParesBell, Noreta Kue L (MD)

## 2014-06-29 ENCOUNTER — Emergency Department
Admission: EM | Admit: 2014-06-29 | Discharge: 2014-06-29 | Disposition: A | Discharge status: Other Acute Inpatient Hospital | Payer: Medicare Other | Attending: Student | Admitting: Student

## 2014-06-29 ENCOUNTER — Encounter: Payer: Self-pay | Admitting: *Deleted

## 2014-06-29 ENCOUNTER — Inpatient Hospital Stay
Admission: EM | Admit: 2014-06-29 | Discharge: 2014-07-04 | DRG: 885 | Disposition: A | Discharge status: Home / Self Care | Payer: 59 | Source: Intra-hospital | Attending: Psychiatry | Admitting: Psychiatry

## 2014-06-29 DIAGNOSIS — F17200 Nicotine dependence, unspecified, uncomplicated: Secondary | ICD-10-CM

## 2014-06-29 DIAGNOSIS — X788XXA Intentional self-harm by other sharp object, initial encounter: Secondary | ICD-10-CM | POA: Insufficient documentation

## 2014-06-29 DIAGNOSIS — R451 Restlessness and agitation: Secondary | ICD-10-CM | POA: Diagnosis present

## 2014-06-29 DIAGNOSIS — F203 Undifferentiated schizophrenia: Secondary | ICD-10-CM | POA: Diagnosis not present

## 2014-06-29 DIAGNOSIS — E785 Hyperlipidemia, unspecified: Secondary | ICD-10-CM | POA: Diagnosis present

## 2014-06-29 DIAGNOSIS — Y998 Other external cause status: Secondary | ICD-10-CM | POA: Insufficient documentation

## 2014-06-29 DIAGNOSIS — K5900 Constipation, unspecified: Secondary | ICD-10-CM | POA: Diagnosis present

## 2014-06-29 DIAGNOSIS — R45851 Suicidal ideations: Secondary | ICD-10-CM | POA: Diagnosis present

## 2014-06-29 DIAGNOSIS — Y9289 Other specified places as the place of occurrence of the external cause: Secondary | ICD-10-CM | POA: Insufficient documentation

## 2014-06-29 DIAGNOSIS — IMO0002 Reserved for concepts with insufficient information to code with codable children: Secondary | ICD-10-CM

## 2014-06-29 DIAGNOSIS — E871 Hypo-osmolality and hyponatremia: Secondary | ICD-10-CM | POA: Diagnosis present

## 2014-06-29 DIAGNOSIS — S1191XA Laceration without foreign body of unspecified part of neck, initial encounter: Secondary | ICD-10-CM | POA: Diagnosis present

## 2014-06-29 DIAGNOSIS — F200 Paranoid schizophrenia: Principal | ICD-10-CM | POA: Diagnosis present

## 2014-06-29 DIAGNOSIS — T1491XA Suicide attempt, initial encounter: Secondary | ICD-10-CM

## 2014-06-29 DIAGNOSIS — Z79899 Other long term (current) drug therapy: Secondary | ICD-10-CM | POA: Diagnosis not present

## 2014-06-29 DIAGNOSIS — R000 Tachycardia, unspecified: Secondary | ICD-10-CM | POA: Insufficient documentation

## 2014-06-29 DIAGNOSIS — T1491 Suicide attempt: Secondary | ICD-10-CM | POA: Insufficient documentation

## 2014-06-29 DIAGNOSIS — F17210 Nicotine dependence, cigarettes, uncomplicated: Secondary | ICD-10-CM | POA: Diagnosis present

## 2014-06-29 DIAGNOSIS — Y9389 Activity, other specified: Secondary | ICD-10-CM | POA: Insufficient documentation

## 2014-06-29 DIAGNOSIS — I10 Essential (primary) hypertension: Secondary | ICD-10-CM | POA: Diagnosis present

## 2014-06-29 DIAGNOSIS — F209 Schizophrenia, unspecified: Secondary | ICD-10-CM | POA: Insufficient documentation

## 2014-06-29 DIAGNOSIS — Z720 Tobacco use: Secondary | ICD-10-CM | POA: Insufficient documentation

## 2014-06-29 HISTORY — DX: Essential (primary) hypertension: I10

## 2014-06-29 HISTORY — DX: Schizophrenia, unspecified: F20.9

## 2014-06-29 LAB — COMPREHENSIVE METABOLIC PANEL
ALK PHOS: 58 U/L (ref 38–126)
ALT: 11 U/L — ABNORMAL LOW (ref 17–63)
AST: 20 U/L (ref 15–41)
Albumin: 3.8 g/dL (ref 3.5–5.0)
Anion gap: 9 (ref 5–15)
BUN: 8 mg/dL (ref 6–20)
CALCIUM: 8.7 mg/dL — AB (ref 8.9–10.3)
CHLORIDE: 99 mmol/L — AB (ref 101–111)
CO2: 22 mmol/L (ref 22–32)
Creatinine, Ser: 0.84 mg/dL (ref 0.61–1.24)
GFR calc non Af Amer: >60 mL/min (ref >60)
Glucose, Bld: 143 mg/dL — ABNORMAL HIGH (ref 65–99)
Potassium: 4.1 mmol/L (ref 3.5–5.1)
SODIUM: 130 mmol/L — AB (ref 135–145)
Total Bilirubin: 0.4 mg/dL (ref 0.3–1.2)
Total Protein: 6.7 g/dL (ref 6.5–8.1)

## 2014-06-29 LAB — URINALYSIS COMPLETE WITH MICROSCOPIC (ARMC ONLY)
BACTERIA UA: NONE SEEN
BILIRUBIN URINE: NEGATIVE
GLUCOSE, UA: NEGATIVE mg/dL
Leukocytes, UA: NEGATIVE
Nitrite: NEGATIVE
PROTEIN: NEGATIVE mg/dL
Specific Gravity, Urine: 1.015 (ref 1.005–1.030)
Squamous Epithelial / LPF: NONE SEEN
pH: 6 (ref 5.0–8.0)

## 2014-06-29 LAB — CBC
HCT: 43.4 % (ref 40.0–52.0)
Hemoglobin: 14.8 g/dL (ref 13.0–18.0)
MCH: 30.2 pg (ref 26.0–34.0)
MCHC: 34 g/dL (ref 32.0–36.0)
MCV: 89.1 fL (ref 80.0–100.0)
PLATELETS: 282 10*3/uL (ref 150–440)
RBC: 4.88 MIL/uL (ref 4.40–5.90)
RDW: 13.8 % (ref 11.5–14.5)
WBC: 13.9 10*3/uL — ABNORMAL HIGH (ref 3.8–10.6)

## 2014-06-29 LAB — DIFFERENTIAL
BASOS ABS: 0.2 10*3/uL — AB (ref 0–0.1)
Basophils Relative: 1 %
EOS PCT: 0 %
Eosinophils Absolute: 0 10*3/uL (ref 0–0.7)
Lymphocytes Relative: 18 %
Lymphs Abs: 2.5 10*3/uL (ref 1.0–3.6)
Monocytes Absolute: 0.9 10*3/uL (ref 0.2–1.0)
Monocytes Relative: 6 %
NEUTROS ABS: 10.5 10*3/uL — AB (ref 1.4–6.5)
Neutrophils Relative %: 75 %

## 2014-06-29 LAB — URINE DRUG SCREEN, QUALITATIVE (ARMC ONLY)
Amphetamines, Ur Screen: NOT DETECTED
Barbiturates, Ur Screen: NOT DETECTED
Benzodiazepine, Ur Scrn: NOT DETECTED
Cannabinoid 50 Ng, Ur ~~LOC~~: NOT DETECTED
Cocaine Metabolite,Ur ~~LOC~~: NOT DETECTED
MDMA (ECSTASY) UR SCREEN: NOT DETECTED
Methadone Scn, Ur: NOT DETECTED
Opiate, Ur Screen: NOT DETECTED
PHENCYCLIDINE (PCP) UR S: NOT DETECTED
Tricyclic, Ur Screen: NOT DETECTED

## 2014-06-29 LAB — ACETAMINOPHEN LEVEL: Acetaminophen (Tylenol), Serum: <10 ug/mL — ABNORMAL LOW (ref 10–30)

## 2014-06-29 LAB — SALICYLATE LEVEL

## 2014-06-29 LAB — ETHANOL

## 2014-06-29 MED ORDER — ALUM & MAG HYDROXIDE-SIMETH 200-200-20 MG/5ML PO SUSP: 30.0000 mL | ORAL | Status: DC | PRN

## 2014-06-29 MED ORDER — LIDOCAINE-EPINEPHRINE (PF) 1 %-1:200000 IJ SOLN
INTRAMUSCULAR | Status: AC
  Administered 2014-06-29: 13:00:00
  Filled 2014-06-29: qty 30

## 2014-06-29 MED ORDER — DIVALPROEX SODIUM 500 MG PO DR TAB
1250.0000 mg | DELAYED_RELEASE_TABLET | Freq: Two times a day (BID) | ORAL | Status: DC
  Administered 2014-06-29 – 2014-07-04 (×10): 1250 mg via ORAL
  Filled 2014-06-29 (×9): qty 2

## 2014-06-29 MED ORDER — MAGNESIUM HYDROXIDE 400 MG/5ML PO SUSP
30.0000 mL | Freq: Every day | ORAL | Status: DC | PRN
  Administered 2014-07-03: 30 mL via ORAL
  Filled 2014-06-29 (×2): qty 30

## 2014-06-29 MED ORDER — NICOTINE 10 MG IN INHA
1.0000 | RESPIRATORY_TRACT | Status: DC | PRN
  Filled 2014-06-29: qty 36

## 2014-06-29 MED ORDER — ACETAMINOPHEN 325 MG PO TABS: 650.0000 mg | ORAL_TABLET | Freq: Four times a day (QID) | ORAL | Status: DC | PRN

## 2014-06-29 MED ORDER — SIMVASTATIN 20 MG PO TABS
20.0000 mg | ORAL_TABLET | Freq: Every day | ORAL | Status: DC
  Administered 2014-06-30 – 2014-07-01 (×2): 20 mg via ORAL
  Filled 2014-06-29 (×2): qty 1

## 2014-06-29 MED ORDER — TETANUS-DIPHTHERIA TOXOIDS TD 5-2 LFU IM INJ: 0.5000 mL | INJECTION | Freq: Once | INTRAMUSCULAR | Status: DC

## 2014-06-29 MED ORDER — CLOZAPINE 100 MG PO TABS
400.0000 mg | ORAL_TABLET | Freq: Every day | ORAL | Status: DC
  Administered 2014-06-29 – 2014-07-03 (×5): 400 mg via ORAL
  Filled 2014-06-29 (×5): qty 4

## 2014-06-29 MED ORDER — CLOZAPINE 100 MG PO TABS
100.0000 mg | ORAL_TABLET | Freq: Every day | ORAL | Status: DC
  Administered 2014-06-30 – 2014-07-04 (×4): 100 mg via ORAL
  Filled 2014-06-29 (×4): qty 1

## 2014-06-29 NOTE — Progress Notes (Signed)
MEDICATION RELATED CONSULT NOTE - INITIAL   Pharmacy Consult for Clozaril Monitoring  No Known Allergies  Patient Measurements: Height: 6' (182.9 cm) Weight: 218 lb (98.884 kg) IBW/kg (Calculated) : 77.6   Vital Signs: Temp: 97.9 F (36.6 C) (06/08 1750) Temp Source: Oral (06/08 1750) BP: 149/90 mmHg (06/08 1750) Pulse Rate: 111 (06/08 1750) Intake/Output from previous day:   Intake/Output from this shift:    Labs:  Recent Labs  06/29/14 1239  WBC 13.9*  HGB 14.8  HCT 43.4  PLT 282  CREATININE 0.84  ALBUMIN 3.8  PROT 6.7  AST 20  ALT 11*  ALKPHOS 58  BILITOT 0.4   Estimated Creatinine Clearance: 125.3 mL/min (by C-G formula based on Cr of 0.84).   Microbiology: No results found for this or any previous visit (from the past 720 hour(s)).  Medical History: Past Medical History  Diagnosis Date  . Schizophrenia   . Hypertension     Medications:  Prescriptions prior to admission  Medication Sig Dispense Refill Last Dose  . atenolol (TENORMIN) 50 MG tablet Take 50 mg by mouth daily.   unknown at unknown  . cloZAPine (CLOZARIL) 100 MG tablet Take 100-400 mg by mouth 2 (two) times daily. Patient takes 1 tablet (100 mg) in the morning and 4 tablets (400 mg) at bedtime.   unknown at unknown  . divalproex (DEPAKOTE) 500 MG DR tablet Take 1,000-1,500 mg by mouth 2 (two) times daily. Patient takes 2 tablets (1000 mg) in the morning and 3 tablets (1500 mg) at bedtime.   unknown at unknown  . simvastatin (ZOCOR) 40 MG tablet Take 40 mg by mouth at bedtime.   unknown at unknown   Scheduled:  . cloZAPine  100 mg Oral Daily  . cloZAPine  400 mg Oral QHS  . divalproex  1,250 mg Oral Q12H  . simvastatin  20 mg Oral q1800    Assessment: Patient on clozapine with labs wnl and results reported to registry 06/08.   Plan:  Next labs scheduled for 06/15.  Luisa Harthristy, Lazarius Rivkin D 06/29/2014,10:18 PM

## 2014-06-29 NOTE — Consult Note (Signed)
[facility] Consult   Reason for Consult:  Consult for this 54 year old man with schizophrenia who was brought in by law enforcement after cutting his own throat. Referring Physician:  Clearnce Hasten Patient Identification: Jeffrey [name] MRN:  [id number] Principal Diagnosis: Schizophrenia Diagnosis:   Patient Active Problem List   Diagnosis Date Noted  . Schizophrenia [F20.9] 06/29/2014  . Hypertension [I10] 06/29/2014  . Hyponatremia [E87.1] 06/29/2014  . Laceration of neck [S11.91XA] 06/29/2014  . Suicidal behavior [F48.9] 06/29/2014    Total Time spent with patient: 45 minutes  Subjective:   Jeffrey [name] is a 54 y.o. male patient admitted with patient would only say "I don't feel like talking, go away". Attempted the interview when he would not cooperate.Marland Kitchen  HPI:  Patient was brought in by Event organiser. Paperwork states that he was seen sitting in front of a local hardware store and something about him attracted attention. He was holding some razor blades in his hand. When law enforcement approached him to speak he cut himself across the front of the neck with a razor blade. Here in the emergency room the patient is refusing to interact with me or give me any history. It is not known whether he has been compliant with psychiatric medicine or whether there've been  new stresses involved.  Past psychiatric history: Long history of schizophrenia multiple prior hospitalizations. Last time we interacted with him here he was being followed by an act team and was treated with clozapine. 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 Serious suicide attempts in the past. Instructed be irritable and poorly cooperative.  Social history: From what I'm seeing in the notes it looks like most recently he tends to live with his mother. I tried to call at phone number we have and no one answered.  Medical history: Patient has an acute  laceration to the front of his neck which as been evaluated by emergency room staff. It seems to be superficial lower only down into the skin and not actively bleeding. No sign that he has cut any vital structures or blood vessels. He has a history of high blood pressure and chronic hyponatremia which might be related to his medication. 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  Substance abuse history: He refuses to answer any questions right now. Does not have a past history of this being a major part of his problem.  History: Unknown at this point  Current medications: Unknown. He won't cooperate in the interview. I can go by the medicines that he was taking when he was here in the hospital last time which was about 4 months ago. HPI Elements:   Quality:  Suicidal behavior self-mutilation. Severity:  Severe. Timing:  Acute distress tap this afternoon or morning. Duration:  Unknown what the duration of active symptoms as been. Context:  Unknown context.  Past Medical History:  Past Medical History  Diagnosis Date  . Schizophrenia    History reviewed. No pertinent past surgical history. Family History: No family history on file. Social History:  History  Alcohol Use No     History  Drug Use Not on file    History   Social History  . Marital Status: Single    Spouse Name: N/A  . Number of Children: N/A  . Years of Education: N/A   Social History Main Topics  . Smoking status: Current Every Day Smoker  . Smokeless tobacco: Not on file  .  Alcohol Use: No  . Drug Use: Not on file  . Sexual Activity: Not on file   Other Topics Concern  . None   Social History Narrative  . None   Additional Social History:                          Allergies:  No Known Allergies  Labs:  Results for orders placed or performed during the hospital encounter of 06/29/14 (from the past 48 hour(s))  Acetaminophen level     Status: Abnormal   Collection Time:  06/29/14 12:39 PM  Result Value Ref Range   Acetaminophen (Tylenol), Serum <10 (L) 10 - 30 ug/mL    Comment:        THERAPEUTIC CONCENTRATIONS VARY SIGNIFICANTLY. A RANGE OF 10-30 ug/mL MAY BE AN EFFECTIVE CONCENTRATION FOR MANY PATIENTS. HOWEVER, SOME ARE BEST TREATED AT CONCENTRATIONS OUTSIDE THIS RANGE. ACETAMINOPHEN CONCENTRATIONS >150 ug/mL AT 4 HOURS AFTER INGESTION AND >50 ug/mL AT 12 HOURS AFTER INGESTION ARE OFTEN ASSOCIATED WITH TOXIC REACTIONS.   CBC     Status: Abnormal   Collection Time: 06/29/14 12:39 PM  Result Value Ref Range   WBC 13.9 (H) 3.8 - 10.6 K/uL   RBC 4.88 4.40 - 5.90 MIL/uL   Hemoglobin 14.8 13.0 - 18.0 g/dL   HCT 43.4 40.0 - 52.0 %   MCV 89.1 80.0 - 100.0 fL   MCH 30.2 26.0 - 34.0 pg   MCHC 34.0 32.0 - 36.0 g/dL   RDW 13.8 11.5 - 14.5 %   Platelets 282 150 - 440 K/uL  Comprehensive metabolic panel     Status: Abnormal   Collection Time: 06/29/14 12:39 PM  Result Value Ref Range   Sodium 130 (L) 135 - 145 mmol/L   Potassium 4.1 3.5 - 5.1 mmol/L   Chloride 99 (L) 101 - 111 mmol/L   CO2 22 22 - 32 mmol/L   Glucose, Bld 143 (H) 65 - 99 mg/dL   BUN 8 6 - 20 mg/dL   Creatinine, Ser 0.84 0.61 - 1.24 mg/dL   Calcium 8.7 (L) 8.9 - 10.3 mg/dL   Total Protein 6.7 6.5 - 8.1 g/dL   Albumin 3.8 3.5 - 5.0 g/dL   AST 20 15 - 41 U/L   ALT 11 (L) 17 - 63 U/L   Alkaline Phosphatase 58 38 - 126 U/L   Total Bilirubin 0.4 0.3 - 1.2 mg/dL   GFR calc non Af Amer >60 >60 mL/min   GFR calc Af Amer >60 >60 mL/min    Comment: (NOTE) The eGFR has been calculated using the CKD EPI equation. This calculation has not been validated in all clinical situations. eGFR's persistently <60 mL/min signify possible Chronic Kidney Disease.    Anion gap 9 5 - 15  Ethanol (ETOH)     Status: None   Collection Time: 06/29/14 12:39 PM  Result Value Ref Range   Alcohol, Ethyl (B) <5 <5 mg/dL    Comment:        LOWEST DETECTABLE LIMIT FOR SERUM ALCOHOL IS 5 mg/dL FOR  MEDICAL PURPOSES ONLY   Salicylate level     Status: None   Collection Time: 06/29/14 12:39 PM  Result Value Ref Range   Salicylate Lvl <7.4 2.8 - 30.0 mg/dL  Urine Drug Screen, Qualitative ([facility] only)     Status: None   Collection Time: 06/29/14 12:39 PM  Result Value Ref Range   Tricyclic, Ur Screen NONE DETECTED NONE  DETECTED   Amphetamines, Ur Screen NONE DETECTED NONE DETECTED   MDMA (Ecstasy)Ur Screen NONE DETECTED NONE DETECTED   Cocaine Metabolite,Ur [place] NONE DETECTED NONE DETECTED   Opiate, Ur Screen NONE DETECTED NONE DETECTED   Phencyclidine (PCP) Ur S NONE DETECTED NONE DETECTED   Cannabinoid 50 Ng, Ur [place] NONE DETECTED NONE DETECTED   Barbiturates, Ur Screen NONE DETECTED NONE DETECTED   Benzodiazepine, Ur Scrn NONE DETECTED NONE DETECTED   Methadone Scn, Ur NONE DETECTED NONE DETECTED    Comment: (NOTE) 263  Tricyclics, urine               Cutoff 1000 ng/mL 200  Amphetamines, urine             Cutoff 1000 ng/mL 300  MDMA (Ecstasy), urine           Cutoff 500 ng/mL 400  Cocaine Metabolite, urine       Cutoff 300 ng/mL 500  Opiate, urine                   Cutoff 300 ng/mL 600  Phencyclidine (PCP), urine      Cutoff 25 ng/mL 700  Cannabinoid, urine              Cutoff 50 ng/mL 800  Barbiturates, urine             Cutoff 200 ng/mL 900  Benzodiazepine, urine           Cutoff 200 ng/mL 1000 Methadone, urine                Cutoff 300 ng/mL 1100 1200 The urine drug screen provides only a preliminary, unconfirmed 1300 analytical test result and should not be used for non-medical 1400 purposes. Clinical consideration and professional judgment should 1500 be applied to any positive drug screen result due to possible 1600 interfering substances. A more specific alternate chemical method 1700 must be used in order to obtain a confirmed analytical result.  1800 Gas chromato graphy / mass spectrometry (GC/MS) is the preferred 1900 confirmatory method.   Urinalysis complete, with  microscopic ([facility] only)     Status: Abnormal   Collection Time: 06/29/14 12:39 PM  Result Value Ref Range   Color, Urine YELLOW (A) YELLOW   APPearance CLEAR (A) CLEAR   Glucose, UA NEGATIVE NEGATIVE mg/dL   Bilirubin Urine NEGATIVE NEGATIVE   Ketones, ur TRACE (A) NEGATIVE mg/dL   Specific Gravity, Urine 1.015 1.005 - 1.030   Hgb urine dipstick 2+ (A) NEGATIVE   pH 6.0 5.0 - 8.0   Protein, ur NEGATIVE NEGATIVE mg/dL   Nitrite NEGATIVE NEGATIVE   Leukocytes, UA NEGATIVE NEGATIVE   RBC / HPF 0-5 0 - 5 RBC/hpf   WBC, UA 0-5 0 - 5 WBC/hpf   Bacteria, UA NONE SEEN NONE SEEN   Squamous Epithelial / LPF NONE SEEN NONE SEEN    Vitals: Blood pressure 176/90, pulse 118, temperature 97.8 F (36.6 C), temperature source Oral, resp. rate 18, height 6' (1.829 m), weight 106.595 kg (235 lb), SpO2 97 %.  Risk to Self: Is patient at risk for suicide?: Yes Risk to Others:   Prior Inpatient Therapy:   Prior Outpatient Therapy:    No current facility-administered medications for this encounter.   No current outpatient prescriptions on file.    Musculoskeletal: Strength & Muscle Tone: within normal limits Gait & Station: Unknown Patient leans: N/A  Psychiatric Specialty Exam: Physical Exam  Constitutional: He appears well-developed and well-nourished.  HENT:  Head: Normocephalic.    Eyes: Conjunctivae are normal. Pupils are equal, round, and reactive to light.  Neck:    Respiratory: Effort normal.  Musculoskeletal: Normal range of motion.       Arms: Neurological: He is alert.  Skin: Skin is warm and dry.  Psychiatric: His affect is angry. He is withdrawn. He expresses impulsivity and inappropriate judgment. He expresses suicidal ideation. He is noncommunicative.  Patient would not cooperate with interview. He is clearly awake but would not make contact. Waved me off stating loudly that he would not talk with me.    Review of Systems  Unable to perform ROS: psychiatric disorder      Blood pressure 176/90, pulse 118, temperature 97.8 F (36.6 C), temperature source Oral, resp. rate 18, height 6' (1.829 m), weight 106.595 kg (235 lb), SpO2 97 %.Body mass index is 31.86 kg/(m^2).  General Appearance: Disheveled and Guarded  Eye Contact::  Absent  Speech:  Patient would only speak a handful of words to me. They were quite coherent however  Volume:  Normal  Mood:  Angry and Dysphoric  Affect:  Restricted  Thought Process:  Unknown because he is unwilling to engage in interviewer conversation  Orientation:  NA  Thought Content:  NA  Suicidal Thoughts:  Yes.  with intent/plan  Homicidal Thoughts:  Unknown whether he has homicidal ideation. I'm presuming suicidal ideation based on his behavior  Memory:  Would not cooperate with testing  Judgement:  Impaired  Insight:  Shallow  Psychomotor Activity:  Decreased  Concentration:  Poor  Recall:  Poor  Fund of Knowledge:Poor  Language: Poor  Akathisia:  No  Handed:  Right  AIMS (if indicated):     Assets:  Financial Resources/Insurance Social Support  ADL's:  Intact  Cognition: Impaired,  Mild  Sleep:      Medical Decision Making: Review of Psycho-Social Stressors (1), Established Problem, Worsening (2), Review or order medicine tests (1) and Review of Medication Regimen & Side Effects (2)  Treatment Plan Summary: Patient with a history of psychosis and self-mutilation presents to the hospital after a traumatic episode of self-mutilation. Refuses to cooperate with interview. Laboratory data is stable and his laceration has been stabilized. No physical contraindication to psychiatric admission. Patient will be admitted to the psychiatry ward on close precautions. I can't confirm his most recent medicine. I will go by what he was taking when he was here in the hospital last time. I have attempted to reach his mother but no one is answering the phone right now. Case discussed with emergency room physician and psychiatry  staff.   Plan:  Recommend psychiatric Inpatient admission when medically cleared. Disposition: Admit. Patient under involuntary commitment petitioned paperwork  Alethia Berthold 06/29/2014 2:46 PM

## 2014-06-29 NOTE — ED Notes (Signed)
Patient assigned to appropriate care area. Patient oriented to unit/care area: Informed that, for their safety, care areas are designed for safety and monitored by security cameras at all times; and visiting hours explained to patient. Patient verbalizes understanding, and verbal contract for safety obtained. 

## 2014-06-29 NOTE — ED Notes (Signed)
Sitter at bedside with patient. Pt eyes closed, color WNL, no bleeding from laceration. Hands within sight.

## 2014-06-29 NOTE — ED Notes (Signed)
Laceration sutured by EDP. No bleeding at this time

## 2014-06-29 NOTE — ED Notes (Signed)
MD at bedside. 

## 2014-06-29 NOTE — BH Assessment (Signed)
Assessment Note  Jeffrey [name] is an 54 y.o. male who presents to the ER due to attempting to kill himself by cutting his throat with a razor. Pt. Has a history of inpatient treatment. Pt. Refused to talk with Clinical research associate. Pt. Stated he was "tired and didn't feel it (talking)."   Axis I: Schizophrenia Axis III:  Past Medical History  Diagnosis Date  . Schizophrenia   . Hypertension    Axis IV: economic problems, problems related to social environment and problems with primary support group  Past Medical History:  Past Medical History  Diagnosis Date  . Schizophrenia   . Hypertension     History reviewed. No pertinent past surgical history.  Family History: History reviewed. No pertinent family history.  Social History:  reports that he has been smoking Cigarettes.  He has been smoking about 1.00 pack per day. He does not have any smokeless tobacco history on file. He reports that he does not drink alcohol or use illicit drugs.  Additional Social History:  Alcohol / Drug Use Pain Medications: None Reported Prescriptions: None Reported Over the Counter: None Reported History of alcohol / drug use?: No history of alcohol / drug abuse Longest period of sobriety (when/how long):  (None Reported) Negative Consequences of Use:  (None Reported) Withdrawal Symptoms:  (None Reported)  CIWA: CIWA-Ar BP: (!) 149/90 mmHg Pulse Rate: (!) 111 COWS:    Allergies: No Known Allergies  Home Medications:  Medications Prior to Admission  Medication Sig Dispense Refill  . atenolol (TENORMIN) 50 MG tablet Take 50 mg by mouth daily.    . cloZAPine (CLOZARIL) 100 MG tablet Take 100-400 mg by mouth 2 (two) times daily. Patient takes 1 tablet (100 mg) in the morning and 4 tablets (400 mg) at bedtime.    . divalproex (DEPAKOTE) 500 MG DR tablet Take 1,000-1,500 mg by mouth 2 (two) times daily. Patient takes 2 tablets (1000 mg) in the morning and 3 tablets (1500 mg) at bedtime.    . simvastatin  (ZOCOR) 40 MG tablet Take 40 mg by mouth at bedtime.      OB/GYN Status:  No LMP for male patient.  General Assessment Data Location of Assessment: [facility] ED TTS Assessment: In system Is this a Tele or Face-to-Face Assessment?: Face-to-Face Is this an Initial Assessment or a Re-assessment for this encounter?: Initial Assessment Marital status: Single Maiden name: n/a Is patient pregnant?: No Living Arrangements: Other (Comment), Parent (Pt. refused to talk) Can pt return to current living arrangement?: Yes Admission Status: Involuntary Is patient capable of signing voluntary admission?: No Referral Source: Other Insurance type: [employer]  Medical Screening Exam [facility] Walk-in ONLY) Medical Exam completed: Yes  Crisis Care Plan Living Arrangements: Other (Comment), Parent (Pt. refused to talk) Name of Psychiatrist: n/a Name of Therapist: n/a  Education Status Is patient currently in school?: Yes Current Grade: n/a Highest grade of school patient has completed: Unknown Name of school: n/a Contact person: n/a  Risk to self with the past 6 months Suicidal Ideation: Yes-Currently Present Has patient been a risk to self within the past 6 months prior to admission? : Yes Suicidal Intent: Yes-Currently Present Has patient had any suicidal intent within the past 6 months prior to admission? : Yes Is patient at risk for suicide?: Yes Suicidal Plan?: Yes-Currently Present Has patient had any suicidal plan within the past 6 months prior to admission? : Yes Specify Current Suicidal Plan: Pt. attempted to cut his throat with a razor Access to  Means: Yes Specify Access to Suicidal Means: Have access to razors What has been your use of drugs/alcohol within the last 12 months?: None Reported Previous Attempts/Gestures: Yes How many times?: 1 Other Self Harm Risks: None Reported Triggers for Past Attempts: Unknown Intentional Self Injurious Behavior: None Family Suicide History:  Unknown Recent stressful life event(s): Other (Comment) Persecutory voices/beliefs?: No Depression: Yes Depression Symptoms: Feeling worthless/self pity, Feeling angry/irritable Substance abuse history and/or treatment for substance abuse?: No (None Reported) Suicide prevention information given to non-admitted patients: Not applicable  Risk to Others within the past 6 months Homicidal Ideation: No Does patient have any lifetime risk of violence toward others beyond the six months prior to admission? : No Thoughts of Harm to Others: No Current Homicidal Intent: No Current Homicidal Plan: No Access to Homicidal Means: No Identified Victim: None Reported History of harm to others?: No Assessment of Violence: None Noted Violent Behavior Description: None Reported Does patient have access to weapons?: No Criminal Charges Pending?: No Does patient have a court date: No Is patient on probation?: No  Psychosis Hallucinations: None noted Delusions: None noted  Mental Status Report Appearance/Hygiene: Unremarkable Eye Contact: Poor Motor Activity: Freedom of movement, Unremarkable Speech: Rapid, Tangential Level of Consciousness: Alert Mood: Anxious, Labile, Suspicious Affect: Flat, Inconsistent with thought content Anxiety Level: Moderate Thought Processes: Coherent, Relevant Judgement: Unimpaired Orientation: Person, Place, Time, Situation Obsessive Compulsive Thoughts/Behaviors: Minimal  Cognitive Functioning Concentration: Normal Memory: Recent Intact, Remote Intact IQ: Average Insight: Poor Impulse Control: Poor Appetite: Fair Weight Loss: 0 Weight Gain: 0 Sleep: No Change Total Hours of Sleep: 7 Vegetative Symptoms: None  ADLScreening [facility] Assessment Services) Patient's cognitive ability adequate to safely complete daily activities?: Yes Patient able to express need for assistance with ADLs?: Yes Independently performs ADLs?: Yes (appropriate for developmental  age)  Prior Inpatient Therapy Prior Inpatient Therapy: Yes Prior Therapy Dates: 02/2014 & 10/2013 [facility] Reason for Treatment: Schizophrenia   Prior Outpatient Therapy Prior Outpatient Therapy: No Prior Therapy Dates: Unknown Prior Therapy Facilty/Provider(s): Unknown Reason for Treatment: Unknown Does patient have an ACCT team?: No Does patient have Intensive In-House Services?  : No Does patient have [facility] services? : No Does patient have P4CC services?: No  ADL Screening (condition at time of admission) Patient's cognitive ability adequate to safely complete daily activities?: Yes Is the patient deaf or have difficulty hearing?: No Does the patient have difficulty seeing, even when wearing glasses/contacts?: No Does the patient have difficulty concentrating, remembering, or making decisions?: No Patient able to express need for assistance with ADLs?: Yes Does the patient have difficulty dressing or bathing?: No Independently performs ADLs?: Yes (appropriate for developmental age) Does the patient have difficulty walking or climbing stairs?: No Weakness of Legs: None Weakness of Arms/Hands: None  Home Assistive Devices/Equipment Home Assistive Devices/Equipment: None  Therapy Consults (therapy consults require a physician order) PT Evaluation Needed: No OT Evalulation Needed: No SLP Evaluation Needed: No Abuse/Neglect Assessment (Assessment to be complete while patient is alone) Physical Abuse: Denies Verbal Abuse: Denies Sexual Abuse: Denies Exploitation of patient/patient's resources: Denies Self-Neglect: Denies Values / Beliefs Cultural Requests During Hospitalization: None Spiritual Requests During Hospitalization: None Consults Spiritual Care Consult Needed: No Social Work Consult Needed: No Merchant navy officerAdvance Directives (For Healthcare) Does patient have an advance directive?: No Would patient like information on creating an  advanced directive?: Yes English as a second language teacher- Educational materials given Nutrition Screen- [facility] Adult/[facility]/AP Patient's home diet: Regular Has the patient recently lost weight without trying?:  No Has the patient been eating poorly because of a decreased appetite?: No Malnutrition Screening Tool Score: 0  Additional Information 1:1 In Past 12 Months?: No CIRT Risk: No Elopement Risk: No Does patient have medical clearance?: Yes  Child/Adolescent Assessment Running Away Risk: Denies (Pt. an adult)  Disposition:  Disposition Initial Assessment Completed for this Encounter: Yes Disposition of Patient: Inpatient treatment program Type of inpatient treatment program: Adult  On Site Evaluation by:   Reviewed with Physician:    Lilyan Gilford, MS, LCAS, LPC, NCC, CCSI 06/29/2014 7:14 PM

## 2014-06-29 NOTE — ED Notes (Signed)
BEHAVIORAL HEALTH ROUNDING Patient sleeping: No. Patient alert and oriented: yes Behavior appropriate: Yes.  ;  Nutrition and fluids offered: Yes  Toileting and hygiene offered: Yes  Sitter present: yes Law enforcement present: Yes  

## 2014-06-29 NOTE — ED Notes (Signed)
Pt found in [employer] parking lot with razor blades, cut to left side of neck. Pt attempting to hurt himself. Denies HI. Pt hx of schizophrenia. Pt alert and oriented X4, active, cooperative, pt in NAD. RR even and unlabored, color WNL.

## 2014-06-29 NOTE — ED Provider Notes (Signed)
[facility] Emergency Department Provider Note  ____________________________________________  Time seen: On arrival to the emergency department  I have reviewed the triage vital signs and the nursing notes.   HISTORY  Chief Complaint Laceration and Suicide Attempt    HPI Jeffrey [name] is a 53 y.o. male with a history of schizophrenia who cut his neck with a razor blade prior to arrival. The patient was found sitting on a bench outside of [employer] by police. He was holding a razor blades at the time. When approached by police he told the officer that he was "tired" and then cut his throat with a razor blade. The officer then called 911 who bandaged his neck. The police officer completed an involuntary commitment. The patient says that he is "tired" because of things that other people have done to him. He admits to suicidal ideation. Denies any homicidal ideation or hallucinations.   Past Medical History  Diagnosis Date  . Schizophrenia     There are no active problems to display for this patient.   History reviewed. No pertinent past surgical history.  No current outpatient prescriptions on file.  Allergies Review of patient's allergies indicates no known allergies.  No family history on file.  Social History History  Substance Use Topics  . Smoking status: Current Every Day Smoker  . Smokeless tobacco: Not on file  . Alcohol Use: No    Review of Systems Constitutional: No fever/chills Eyes: No visual changes. ENT: No sore throat. Cardiovascular: Denies chest pain. Respiratory: Denies shortness of breath. Gastrointestinal: No abdominal pain.  No nausea, no vomiting.  No diarrhea.  No constipation. Genitourinary: Negative for dysuria. Musculoskeletal: Negative for back pain. Skin: Negative for rash. Neurological: Negative for headaches, focal weakness or numbness. Psychiatric:Suicidal ideation  10-point ROS otherwise  negative.  ____________________________________________   PHYSICAL EXAM:  VITAL SIGNS: ED Triage Vitals  Enc Vitals Group     BP 06/29/14 1234 176/90 mmHg     Pulse Rate 06/29/14 1234 118     Resp 06/29/14 1234 18     Temp 06/29/14 1234 97.8 F (36.6 C)     Temp Source 06/29/14 1234 Oral     SpO2 06/29/14 1234 97 %     Weight 06/29/14 1234 235 lb (106.595 kg)     Height 06/29/14 1234 6' (1.829 m)     Head Cir --      Peak Flow --      Pain Score 06/29/14 1235 8     Pain Loc --      Pain Edu? --      Excl. in GC? --     Constitutional: Alert and oriented. Well appearing and in no acute distress. Eyes: Conjunctivae are normal. PERRL. EOMI. Head: Atraumatic. Nose: No congestion/rhinnorhea. Mouth/Throat: Mucous membranes are moist.  Oropharynx non-erythematous. Neck: No stridor.  Initially with gauze to neck. Left sided zone to 10 cm horizontal laceration to the adipose. No bleeding at this time. No bubbling. No audible bruit. 15 cm superficial laceration of the right side of the neck which is horizontal. Also no active bleeding. No surrounding induration or pus to any of the wounds. Cardiovascular: Tachycardic, regular rhythm. Grossly normal heart sounds.  Good peripheral circulation. Respiratory: Normal respiratory effort.  No retractions. Lungs CTAB. Gastrointestinal: Soft and nontender. No distention. No abdominal bruits. No CVA tenderness. Musculoskeletal: No lower extremity tenderness nor edema.  No joint effusions. Neurologic:  Normal speech and language. No gross focal neurologic deficits are  appreciated. Speech is normal. No gait instability. Skin:  Skin is warm, dry. No rash noted. Psychiatric: Mood and affect are normal. Speech and behavior are normal.  ____________________________________________   LABS (all labs ordered are listed, but only abnormal results are displayed)  Labs Reviewed  ACETAMINOPHEN LEVEL - Abnormal; Notable for the following:     Acetaminophen (Tylenol), Serum <10 (*)    All other components within normal limits  CBC - Abnormal; Notable for the following:    WBC 13.9 (*)    All other components within normal limits  COMPREHENSIVE METABOLIC PANEL - Abnormal; Notable for the following:    Sodium 130 (*)    Chloride 99 (*)    Glucose, Bld 143 (*)    Calcium 8.7 (*)    ALT 11 (*)    All other components within normal limits  URINALYSIS COMPLETEWITH MICROSCOPIC ([facility] ONLY) - Abnormal; Notable for the following:    Color, Urine YELLOW (*)    APPearance CLEAR (*)    Ketones, ur TRACE (*)    Hgb urine dipstick 2+ (*)    All other components within normal limits  ETHANOL  SALICYLATE LEVEL  URINE DRUG SCREEN, QUALITATIVE ([facility] ONLY)   ____________________________________________  EKG   ____________________________________________  RADIOLOGY   ____________________________________________   PROCEDURES  LACERATION REPAIR Performed by: Arelia LongestSchaevitz,  David M Authorized by: Arelia LongestSchaevitz,  David M Consent: Verbal consent obtained. Risks and benefits: risks, benefits and alternatives were discussed Consent given by: patient Patient identity confirmed: provided demographic data Prepped and Draped in normal sterile fashion Wound explored  Laceration Location: Left side of the neck to zone 2  Laceration Length: 10 cm  No Foreign Bodies seen or palpated, laceration explored to the base in a bloodless field. Only goes to adipose. Does not violate the platysma.  Anesthesia: local infiltration  Local anesthetic: lidocaine 1%  with epinephrine  Anesthetic total: 4 ml  Irrigation method: syringe Amount of cleaning: standard  Skin closure: Subcutaneous   Number of sutures: 6, 4-0 nylon   Technique: Simple interrupted   Patient tolerance: Patient tolerated the procedure well with no immediate complications.   ____________________________________________   INITIAL IMPRESSION / ASSESSMENT AND PLAN / ED  COURSE  Pertinent labs & imaging results that were available during my care of the patient were reviewed by me and considered in my medical decision making (see chart for details).  Patient placed on one-to-one with sitter. To give tetanus shot as patient does not know today's of his last tetanus shot. To be seen by psychiatry. IVC upheld. ____________________________________________   FINAL CLINICAL IMPRESSION(S) / ED DIAGNOSES  Acute suicide attempt. Initial visit. Acute laceration to the left side of the neck. Initial visit.    Myrna Blazeravid Matthew Schaevitz, MD 06/29/14 [phone]

## 2014-06-29 NOTE — Tx Team (Signed)
Initial Interdisciplinary Treatment Plan   PATIENT STRESSORS: Financial difficulties Health problems Marital or family conflict   PATIENT STRENGTHS: General fund of knowledge Supportive family/friends   PROBLEM LIST: Problem List/Patient Goals Date to be addressed Date deferred Reason deferred Estimated date of resolution  psychosis 06/29/14     manic 06/29/14     Risk for suicide 06/29/14     Family issues 06/29/14     "feeling better" 06/29/14                              DISCHARGE CRITERIA:  Ability to meet basic life and health needs Improved stabilization in mood, thinking, and/or behavior Verbal commitment to aftercare and medication compliance  PRELIMINARY DISCHARGE PLAN: Attend aftercare/continuing care group Outpatient therapy  PATIENT/FAMIILY INVOLVEMENT: This treatment plan has been presented to and reviewed with the patient, Jeffrey [name].  The patient and family have been given the opportunity to ask questions and make suggestions.  Olander Friedl B 06/29/2014, 7:10 PM

## 2014-06-29 NOTE — ED Notes (Signed)
Bleeding controlled at this time, approx 4 inch laceration to left side of neck

## 2014-06-29 NOTE — ED Notes (Signed)
Jerilynn Somalvin, TTS staff at bedside

## 2014-06-29 NOTE — ED Notes (Signed)
ED BHU PLACEMENT JUSTIFICATION Is the patient under IVC or is there intent for IVC: Yes.   Is the patient medically cleared: No. Is there vacancy in the ED BHU: No. Is the population mix appropriate for patient: No. Has the patient had a psychiatric consult: No. Survey of unit performed for contraband, proper placement and condition of furniture, tampering with fixtures in bathroom, shower, and each patient room: Yes.  ; Findings:  APPEARANCE/BEHAVIOR calm, cooperative and adequate rapport can be established NEURO ASSESSMENT Orientation: time, place and person Hallucinations: No.None noted (Hallucinations) Speech: Normal Gait: normal RESPIRATORY ASSESSMENT Normal expansion.  Clear to auscultation.  No rales, rhonchi, or wheezing. CARDIOVASCULAR ASSESSMENT regular rate and rhythm, S1, S2 normal, no murmur, click, rub or gallop GASTROINTESTINAL ASSESSMENT soft, nontender, BS WNL, no r/g EXTREMITIES normal strength, tone, and muscle mass PLAN OF CARE Provide calm/safe environment. Vital signs assessed twice daily. ED BHU Assessment once each 12-hour shift. Collaborate with intake RN daily or as condition indicates. Assure the ED provider has rounded once each shift. Provide and encourage hygiene. Provide redirection as needed. Assess for escalating behavior; address immediately and inform ED provider.  Assess family dynamic and appropriateness for visitation as needed: Yes.  ; If necessary, describe findings:  Educate the patient/family about BHU procedures/visitation: Yes.  ; If necessary, describe findings:

## 2014-06-29 NOTE — ED Notes (Signed)
Pt given dinner tray, sitter at bedside.

## 2014-06-29 NOTE — ED Notes (Signed)

## 2014-06-29 NOTE — BHH Counselor (Signed)
Writter made several attempts to talk with the pt, to conduct the assessment but he refused to talked. Pt. Stated he was "tired and didn't feel it (talking)."

## 2014-06-29 NOTE — BHH Counselor (Signed)
[facility]. [facility] by Dr. Toni Amendlapacs. Attending Physician will be Dr. Jennet MaduroPucilowska.  Pt. [facility] [place].  Intake Paper Work has been signed and placed on pt. chart. ER staff Bella Kennedy(Allyson RN, Dr. Inocencio HomesGayle ER MD) have been made aware of the admission.

## 2014-06-29 NOTE — Progress Notes (Signed)
54 year old male IVC'ed with suicide attempt.  Patient reports "I just got fed up with living and tried to kill myself"  Denies SI at present time. Body search and skin assessment performed.  2 razor blades found and disposed of in biohazardous bins.  Laceration noted to left side of neck with stiches intact and multiple tattoos.

## 2014-06-29 NOTE — ED Notes (Signed)
Sitter at pt bedside. 

## 2014-06-30 ENCOUNTER — Encounter: Payer: Self-pay | Admitting: Psychiatry

## 2014-06-30 DIAGNOSIS — F17200 Nicotine dependence, unspecified, uncomplicated: Secondary | ICD-10-CM

## 2014-06-30 DIAGNOSIS — F200 Paranoid schizophrenia: Principal | ICD-10-CM

## 2014-06-30 LAB — MISC LABCORP TEST (SEND OUT): Labcorp test code: 706440

## 2014-06-30 NOTE — Progress Notes (Signed)
Recreation Therapy Notes  Date: 06.09.16 Time: 3:00 pm Location: Craft Room  Group Topic: Leisure Education  Goal Area(s) Addresses:  Patient will identify activities for each letter of the alphabet. Patient will verbalize ability to integrate positive leisure into life post d/c. Patient will verbalize ability to use leisure as a Associate Professor.  Behavioral Response: Did not attend  Intervention: Leisure Alphabet  Activity: Patients were given a worksheet with the alphabet on it and instructed to list healthy leisure activities for each letter of the alphabet.  Education: LRT educated patient on what is needed to participate in leisure  Education Outcome: Patient did not attend group.   Clinical Observations/Feedback: Patient did not attend group.  Jacquelynn Cree, LRT/CTRS 06/30/2014 4:12 PM

## 2014-06-30 NOTE — Progress Notes (Signed)
LCSW attempted to have patient complete PSA and patient declined and stated you people know who I am and I need rest. LCSW complied. He stated if we wanted to talk to his Cathren Harsh we could ( Verbal Consent given at 9.55am June 9th /16) He refuses to sign family because he wants to rest. LCSW agreed to come back later.

## 2014-06-30 NOTE — BH Assessment (Signed)
[employer] Enrollment completed and submitted. STR# [id number].

## 2014-06-30 NOTE — BHH Suicide Risk Assessment (Signed)
[facility] Admission Suicide Risk Assessment   Nursing information obtained from:    Demographic factors:    Current Mental Status:    Loss Factors:    Historical Factors:    Risk Reduction Factors:    Total Time spent with patient: 1 hour Principal Problem: Schizophrenia Diagnosis:   Patient Active Problem List   Diagnosis Date Noted  . Tobacco use disorder [Z72.0] 06/30/2014  . Schizophrenia [F20.9] 06/29/2014  . Hypertension [I10] 06/29/2014  . Hyponatremia [E87.1] 06/29/2014  . Laceration of neck [S11.91XA] 06/29/2014  . Suicidal behavior [F48.9] 06/29/2014     Continued Clinical Symptoms:  Alcohol Use Disorder Identification Test Final Score (AUDIT): 0 The "Alcohol Use Disorders Identification Test", Guidelines for Use in Primary Care, Second Edition.  World Science writer [facility]). Score between 0-7:  no or low risk or alcohol related problems. Score between 8-15:  moderate risk of alcohol related problems. Score between 16-19:  high risk of alcohol related problems. Score 20 or above:  warrants further diagnostic evaluation for alcohol dependence and treatment.   CLINICAL FACTORS:   Depression:   Severe Schizophrenia:   Depressive state   Musculoskeletal: Strength & Muscle Tone: within normal limits Gait & Station: normal Patient leans: N/A  Psychiatric Specialty Exam: Physical Exam  Nursing note and vitals reviewed.   Review of Systems  Unable to perform ROS: mental acuity    Blood pressure 118/74, pulse 97, temperature 98.6 F (37 C), temperature source Oral, resp. rate 20, height 6' (1.829 m), weight 98.884 kg (218 lb), SpO2 97 %.Body mass index is 29.56 kg/(m^2).  General Appearance: Disheveled  Eye Contact::  Poor  Speech:  Clear and Coherent  Volume:  Increased  Mood:  Depressed  Affect:  Flat  Thought Process:  Disorganized  Orientation:  Full (Time, Place, and Person)  Thought Content:  WDL  Suicidal Thoughts:  Yes.  with intent/plan  Homicidal  Thoughts:  No  Memory:  Immediate;   unable to assess Recent;   unable to assess Remote;   unable to asses  Judgement:  Poor  Insight:  Lacking  Psychomotor Activity:  Decreased  Concentration:  Poor  Recall:  Poor  Fund of Knowledge:Fair  Language: Fair  Akathisia:  No  Handed:  Right  AIMS (if indicated):     Assets:  Financial Resources/Insurance Physical Health  Sleep:  Number of Hours: 7.15  Cognition: WNL  ADL's:  Intact     COGNITIVE FEATURES THAT CONTRIBUTE TO RISK:  None    SUICIDE RISK:   Severe:  Frequent, intense, and enduring suicidal ideation, specific plan, no subjective intent, but some objective markers of intent (i.e., choice of lethal method), the method is accessible, some limited preparatory behavior, evidence of impaired self-control, severe dysphoria/symptomatology, multiple risk factors present, and few if any protective factors, particularly a lack of social support.  PLAN OF CARE: Hospital admission, medication management, discharge planning.  Medical Decision Making:  New problem, with additional work up planned, Review of Psycho-Social Stressors (1), Review or order clinical lab tests (1), Review of Medication Regimen & Side Effects (2) and Review of New Medication or Change in Dosage (2)   Jeffrey [name] is a 54 year old man with a history of schizophrenia admitted after a suicide attempt by cutting his neck in front of the police officer.  1. Suicidal ideation. The patient is on every 15 minutes checks.  2. Psychosis. He was restarted on clozapine 100 mg in the morning 400 at bedtime. It is unclear  whether or not he was compliant with medications in the community. So far were unable to talk to the mother. He is also taking Depakote for mood stabilization. Depakote level was not checked in the emergency room.  3. Smoking. Nicotine products are available.  4. Dyslipidemia. He is on Zocor 20 mg daily.  5. Disposition. He will likely be discharged back  with his mother. Follow-up with ACT team as usual..   I certify that inpatient services furnished can reasonably be expected to improve the patient's condition.   Jolanta Pucilowska 06/30/2014, 11:28 AM

## 2014-06-30 NOTE — BHH Group Notes (Signed)
[facility] Group Notes:  (Nursing/MHT/Case Management/Adjunct)  Date:  06/30/2014  Time:  2:56 PM  Type of Therapy:  Group Therapy  Participation Level:  Did Not Attend  Summary of Progress/Problems:  Taunia Frasco De'Chelle Korina Tretter 06/30/2014, 2:56 PM 

## 2014-06-30 NOTE — Progress Notes (Signed)
Pt is awake and active in the milieu this evening. Pt mood is depressed and his affect is flat. Pt denies SI/HI and AVH at this time. Pt is pleasant and cooperative with staff and is taking medications as prescribed. Pt is watching TV in the dayroom, is getting along with peers and has no complaints. Writer will continue to monitor.

## 2014-06-30 NOTE — Progress Notes (Signed)
Recreation Therapy Notes  At approximately 3:45 pm, LRT attempted assessment. Patient refused stating his mother knew more about him than he did. LRT will attempt assessment tomorrow.  Jacquelynn Cree, LRT/CTRS 06/30/2014 4:31 PM

## 2014-06-30 NOTE — Progress Notes (Signed)
Patient has refused to answer any questions regarding his mood, mental status, safety, saying "read my chart I have been here before." Did eat meals and took morning medications. Sleeping through most of shift.  Will continue to monitor clinical status, attempt to establish therapeutic relationship, monitor self inflicted wound for healing, maintain all safety precautions.

## 2014-06-30 NOTE — H&P (Signed)
Psychiatric Admission Assessment Adult  Patient Identification: Jeffrey [name] MRN:  [id number] Date of Evaluation:  06/30/2014 Chief Complaint:  schizophrenia Principal Diagnosis: Schizophrenia Diagnosis:   Patient Active Problem List   Diagnosis Date Noted  . Tobacco use disorder [Z72.0] 06/30/2014  . Schizophrenia [F20.9] 06/29/2014  . Hypertension [I10] 06/29/2014  . Hyponatremia [E87.1] 06/29/2014  . Laceration of neck [S11.91XA] 06/29/2014  . Suicidal behavior [F48.9] 06/29/2014   History of Present Illness::   Identifying data. Jeffrey [name] is a 54 year old male with history of schizophrenia.  Chief complaint. The patient unable to state.  History of present illness. Information was obtained mostly from the chart at the patient is not forthcoming. He is irritated with [name] and tells me that I know everything about him already. He has been my patient multiple times in the past he may be right about some of the problems that am familiar with. According to the chart the patient was brought to the emergency room by the police. He cut his neck with a razor superficially in front of police officers. As she could not explain any of his actions and was unwilling or unable to answer questions in the emergency room as well. It is unclear whether or not he's been compliant with medications especially the Depakote level was not checked in the emergency room. We attempted to call Jeffrey [name] his mother but the were unable to get in touch with her yet. The patient denies any problems with depression and anxiety or hallucinations. I do not think he should be trusted. He denies any physical problems. He does not appear to be using substances. Drug tox screen is negative.  Past psychiatric history. There were multiple hospitalizations for exacerbation of psychosis. He was recently hospitalized here in March. He was discharged on a combination of Depakote and Clozaril. He seems to do well on Clozaril. As she has  been tried on multiple other medications in the past. And they're aware that several suicide attempts. The most serious one involved chopping off his arm.  Family psychiatric history. None reported.  Social history. He is disabled from mental illness. He lives with his mother who is supportive. In the past he used to be a resident of group homes but the mother always takes him at home.   Total Time spent with patient: 1 hour  Past Medical History:  Past Medical History  Diagnosis Date  . Schizophrenia   . Hypertension    History reviewed. No pertinent past surgical history. Family History: History reviewed. No pertinent family history. Social History:  History  Alcohol Use No     History  Drug Use No    History   Social History  . Marital Status: Single    Spouse Name: N/A  . Number of Children: N/A  . Years of Education: N/A   Social History Main Topics  . Smoking status: Current Every Day Smoker -- 1.00 packs/day    Types: Cigarettes  . Smokeless tobacco: Not on file  . Alcohol Use: No  . Drug Use: No  . Sexual Activity: Not on file   Other Topics Concern  . None   Social History Narrative   Additional Social History:    Pain Medications: None Reported Prescriptions: None Reported Over the Counter: None Reported History of alcohol / drug use?: No history of alcohol / drug abuse Longest period of sobriety (when/how long):  (None Reported) Negative Consequences of Use:  (None Reported) Withdrawal Symptoms:  (None Reported)  Musculoskeletal: Strength & Muscle Tone: within normal limits Gait & Station: normal Patient leans: N/A  Psychiatric Specialty Exam: I reviewed and concur with the finding of PE performed in the ER.  Physical Exam  Nursing note and vitals reviewed.   Review of Systems  Unable to perform ROS: mental acuity    Blood pressure 118/74, pulse 97, temperature 98.6 F (37 C), temperature source Oral, resp.  rate 20, height 6' (1.829 m), weight 98.884 kg (218 lb), SpO2 97 %.Body mass index is 29.56 kg/(m^2).  See SRA.                                                  Sleep:  Number of Hours: 7.15   Risk to Self: Suicidal Ideation: Yes-Currently Present Suicidal Intent: Yes-Currently Present Is patient at risk for suicide?: Yes Suicidal Plan?: Yes-Currently Present Specify Current Suicidal Plan: Pt. attempted to cut his throat with a razor Access to Means: Yes Specify Access to Suicidal Means: Have access to razors What has been your use of drugs/alcohol within the last 12 months?: None Reported How many times?: 1 Other Self Harm Risks: None Reported Triggers for Past Attempts: Unknown Intentional Self Injurious Behavior: None Risk to Others: Homicidal Ideation: No Thoughts of Harm to Others: No Current Homicidal Intent: No Current Homicidal Plan: No Access to Homicidal Means: No Identified Victim: None Reported History of harm to others?: No Assessment of Violence: None Noted Violent Behavior Description: None Reported Does patient have access to weapons?: No Criminal Charges Pending?: No Does patient have a court date: No Prior Inpatient Therapy: Prior Inpatient Therapy: Yes Prior Therapy Dates: 02/2014 & 10/2013 Prior Therapy Facilty/Provider(s): [facility] Reason for Treatment: Schizophrenia  Prior Outpatient Therapy: Prior Outpatient Therapy: No Prior Therapy Dates: Unknown Prior Therapy Facilty/Provider(s): Unknown Reason for Treatment: Unknown Does patient have an ACCT team?: No Does patient have Intensive In-House Services?  : No Does patient have [facility] services? : No Does patient have P4[facility] services?: No  Alcohol Screening: 1. How often do you have a drink containing alcohol?: Never 9. Have you or someone else been injured as a result of your drinking?: No 10. Has a relative or friend or a doctor or another health worker been concerned about your  drinking or suggested you cut down?: No Alcohol Use Disorder Identification Test Final Score (AUDIT): 0 Brief Intervention: AUDIT score less than 7 or less-screening does not suggest unhealthy drinking-brief intervention not indicated  Allergies:  No Known Allergies Lab Results:  Results for orders placed or performed during the hospital encounter of 06/29/14 (from the past 48 hour(s))  Differential     Status: Abnormal   Collection Time: 06/29/14 12:39 PM  Result Value Ref Range   Neutrophils Relative % 75 %   Neutro Abs 10.5 (H) 1.4 - 6.5 K/uL   Lymphocytes Relative 18 %   Lymphs Abs 2.5 1.0 - 3.6 K/uL   Monocytes Relative 6 %   Monocytes Absolute 0.9 0.2 - 1.0 K/uL   Eosinophils Relative 0 %   Eosinophils Absolute 0.0 0 - 0.7 K/uL   Basophils Relative 1 %   Basophils Absolute 0.2 (H) 0 - 0.1 K/uL   Current Medications: Current Facility-Administered Medications  Medication Dose Route Frequency Provider Last Rate Last Dose  . acetaminophen (TYLENOL) tablet 650 mg  650 mg Oral Q6H PRN  Audery Amel, MD      . alum & mag hydroxide-simeth (MAALOX/MYLANTA) 200-200-20 MG/5ML suspension 30 mL  30 mL Oral Q4H PRN Audery Amel, MD      . cloZAPine (CLOZARIL) tablet 100 mg  100 mg Oral Daily Audery Amel, MD   100 mg at 06/30/14 1002  . cloZAPine (CLOZARIL) tablet 400 mg  400 mg Oral QHS Audery Amel, MD   400 mg at 06/29/14 2202  . divalproex (DEPAKOTE) DR tablet 1,250 mg  1,250 mg Oral Q12H Audery Amel, MD   1,250 mg at 06/30/14 1002  . magnesium hydroxide (MILK OF MAGNESIA) suspension 30 mL  30 mL Oral Daily PRN Audery Amel, MD      . nicotine (NICOTROL) 10 MG inhaler 1 continuous puffing  1 continuous puffing Inhalation PRN Audery Amel, MD      . simvastatin (ZOCOR) tablet 20 mg  20 mg Oral q1800 Audery Amel, MD       PTA Medications: Prescriptions prior to admission  Medication Sig Dispense Refill Last Dose  . atenolol (TENORMIN) 50 MG tablet Take 50 mg by mouth  daily.   unknown at unknown  . cloZAPine (CLOZARIL) 100 MG tablet Take 100-400 mg by mouth 2 (two) times daily. Patient takes 1 tablet (100 mg) in the morning and 4 tablets (400 mg) at bedtime.   unknown at unknown  . divalproex (DEPAKOTE) 500 MG DR tablet Take 1,000-1,500 mg by mouth 2 (two) times daily. Patient takes 2 tablets (1000 mg) in the morning and 3 tablets (1500 mg) at bedtime.   unknown at unknown  . simvastatin (ZOCOR) 40 MG tablet Take 40 mg by mouth at bedtime.   unknown at unknown    Previous Psychotropic Medications: Yes   Substance Abuse History in the last 12 months:  No.    Consequences of Substance Abuse: NA  Results for orders placed or performed during the hospital encounter of 06/29/14 (from the past 72 hour(s))  Differential     Status: Abnormal   Collection Time: 06/29/14 12:39 PM  Result Value Ref Range   Neutrophils Relative % 75 %   Neutro Abs 10.5 (H) 1.4 - 6.5 K/uL   Lymphocytes Relative 18 %   Lymphs Abs 2.5 1.0 - 3.6 K/uL   Monocytes Relative 6 %   Monocytes Absolute 0.9 0.2 - 1.0 K/uL   Eosinophils Relative 0 %   Eosinophils Absolute 0.0 0 - 0.7 K/uL   Basophils Relative 1 %   Basophils Absolute 0.2 (H) 0 - 0.1 K/uL    Observation Level/Precautions:  15 minute checks  Laboratory:  CBC Chemistry Profile UDS UA  Psychotherapy:    Medications:    Consultations:    Discharge Concerns:    Estimated LOS:  Other:     Psychological Evaluations: No   Treatment Plan Summary: Daily contact with patient to assess and evaluate symptoms and progress in treatment and Medication management  Medical Decision Making:  New problem, with additional work up planned, Review of Psycho-Social Stressors (1), Review or order clinical lab tests (1), Review of Medication Regimen & Side Effects (2) and Review of New Medication or Change in Dosage (2)   Mr. Rorke is a 54 year old man with a history of schizophrenia admitted after a suicide attempt by cutting his  neck in front of the police officer.  1. Suicidal ideation. The patient is on every 15 minutes checks.  2. Psychosis. He was restarted on  clozapine 100 mg in the morning 400 at bedtime. It is unclear whether or not he was compliant with medications in the community. So far were unable to talk to the mother. He is also taking Depakote for mood stabilization. Depakote level was not checked in the emergency room.  3. Smoking. Nicotine products are available.  4. Dyslipidemia. He is on Zocor 20 mg daily.  5. Disposition. He will likely be discharged back with his mother. Follow-up with ACT team as usual..   I certify that inpatient services furnished can reasonably be expected to improve the patient's condition.   Jonothan Heberle 6/9/201611:37 AM

## 2014-07-01 NOTE — Progress Notes (Signed)
D) Patient pleasant and cooperative upon my assessment. Patient did not complete Self Inventory Assessment.  Patient denies SI/HI, denies A/V hallucinations.   Patient's affect is flat and mood is Sad. Reports that his appetite is good.  A) Patient offered support and encouragement, patient encouraged to discuss feelings/concerns with staff. Patient verbalized understanding. Patient monitored Q15 minutes for safety. Patient met with MD  to discuss today's goals and plan of care.  R) Patient spends time in his room laying in bed.  He does not attend group and interacts minimally with his peers. . Patient appropriate with staff and peers.   Patient taking medications as ordered. Will continue to monitor.

## 2014-07-01 NOTE — Progress Notes (Signed)
[facility] MD Progress Note  07/01/2014 1:19 PM DRAGAN TAMBURRINO  MRN:  [id number]  Subjective:   Jeffrey [name] cut his throat with a razer in front of police officers.  Jeffrey [name] slept through the day yesterday. This could be from the medication that was restarted including the Clozaril. Today he up and about rather restless and jittery. He denies any symptoms of depression, anxiety, or psychosis. He denies any thoughts of hurting himself or others but was admitted after cutting his neck with a razor blade in front of the police officer. He has no somatic complaints. He is much more cooperated and allow the nurses to examine his neck today which he refused to do yesterday. He took his medications as prescribed.  Principal Problem: Paranoid schizophrenia Diagnosis:   Patient Active Problem List   Diagnosis Date Noted  . Tobacco use disorder [Z72.0] 06/30/2014  . Paranoid schizophrenia [F20.0] 06/30/2014  . Hypertension [I10] 06/29/2014  . Hyponatremia [E87.1] 06/29/2014  . Laceration of neck [S11.91XA] 06/29/2014  . Suicidal behavior [F48.9] 06/29/2014   Total Time spent with patient: 20 minutes   Past Medical History:  Past Medical History  Diagnosis Date  . Schizophrenia   . Hypertension    History reviewed. No pertinent past surgical history. Family History: History reviewed. No pertinent family history. Social History:  History  Alcohol Use No     History  Drug Use No    History   Social History  . Marital Status: Single    Spouse Name: N/A  . Number of Children: N/A  . Years of Education: N/A   Social History Main Topics  . Smoking status: Current Every Day Smoker -- 1.00 packs/day    Types: Cigarettes  . Smokeless tobacco: Not on file  . Alcohol Use: No  . Drug Use: No  . Sexual Activity: Not on file   Other Topics Concern  . None   Social History Narrative   Additional History:    Sleep: Good  Appetite:  Good   Assessment:   Musculoskeletal: Strength  & Muscle Tone: within normal limits Gait & Station: normal Patient leans: N/A   Psychiatric Specialty Exam: Physical Exam  Nursing note and vitals reviewed.   Review of Systems  All other systems reviewed and are negative.   Blood pressure 118/74, pulse 97, temperature 98.6 F (37 C), temperature source Oral, resp. rate 20, height 6' (1.829 m), weight 98.884 kg (218 lb), SpO2 97 %.Body mass index is 29.56 kg/(m^2).  General Appearance: Disheveled  Eye Contact::  Poor  Speech:  Slow  Volume:  Decreased  Mood:  Depressed  Affect:  Blunt  Thought Process:  Disorganized  Orientation:  Full (Time, Place, and Person)  Thought Content:  Delusions and Ilusions  Suicidal Thoughts:  Yes.  without intent/plan  Homicidal Thoughts:  No  Memory:  Immediate;   Fair Recent;   Fair Remote;   Fair  Judgement:  Fair  Insight:  Fair  Psychomotor Activity:  Normal  Concentration:  Fair  Recall:  [employer] of Knowledge:Fair  Language: Fair  Akathisia:  No  Handed:  Right  AIMS (if indicated):     Assets:  Desire for Improvement Financial Resources/Insurance Housing Social Support  ADL's:  Intact  Cognition: WNL  Sleep:  Number of Hours: 6.5     Current Medications: Current Facility-Administered Medications  Medication Dose Route Frequency Provider Last Rate Last Dose  . acetaminophen (TYLENOL) tablet 650 mg  650 mg Oral Q6H  PRN Audery Amel, MD      . alum & mag hydroxide-simeth (MAALOX/MYLANTA) 200-200-20 MG/5ML suspension 30 mL  30 mL Oral Q4H PRN Audery Amel, MD      . cloZAPine (CLOZARIL) tablet 100 mg  100 mg Oral Daily Audery Amel, MD   100 mg at 07/01/14 1011  . cloZAPine (CLOZARIL) tablet 400 mg  400 mg Oral QHS Audery Amel, MD   400 mg at 06/30/14 2116  . divalproex (DEPAKOTE) DR tablet 1,250 mg  1,250 mg Oral Q12H Audery Amel, MD   1,250 mg at 07/01/14 1010  . magnesium hydroxide (MILK OF MAGNESIA) suspension 30 mL  30 mL Oral Daily PRN Audery Amel, MD       . nicotine (NICOTROL) 10 MG inhaler 1 continuous puffing  1 continuous puffing Inhalation PRN Audery Amel, MD      . simvastatin (ZOCOR) tablet 20 mg  20 mg Oral q1800 Audery Amel, MD   20 mg at 06/30/14 1705    Lab Results: No results found for this or any previous visit (from the past 48 hour(s)).  Physical Findings: AIMS: Facial and Oral Movements Muscles of Facial Expression: None, normal Lips and Perioral Area: None, normal Jaw: None, normal Tongue: None, normal,Extremity Movements Upper (arms, wrists, hands, fingers): None, normal Lower (legs, knees, ankles, toes): None, normal, Trunk Movements Neck, shoulders, hips: None, normal, Overall Severity Severity of abnormal movements (highest score from questions above): None, normal Incapacitation due to abnormal movements: None, normal Patient's awareness of abnormal movements (rate only patient's report): No Awareness, Dental Status Current problems with teeth and/or dentures?: No Does patient usually wear dentures?: No  CIWA:    COWS:     Treatment Plan Summary: Daily contact with patient to assess and evaluate symptoms and progress in treatment and Medication management   Medical Decision Making:  Established Problem, Stable/Improving (1), Review of Psycho-Social Stressors (1), Review or order clinical lab tests (1), Review of Medication Regimen & Side Effects (2) and Review of New Medication or Change in Dosage (2)   Jeffrey [name] is a 54 year old man with a history of schizophrenia admitted after a suicide attempt by cutting his neck in front of the police officer.  1. Suicidal ideation. The patient is on every 15 minutes checks.  2. Psychosis. He was restarted on clozapine 100 mg in the morning 400 at bedtime. It is unclear whether or not he was compliant with medications in the community. So far were unable to talk to the mother. He is also taking Depakote for mood stabilization. Depakote level was not checked in the  emergency room.  3. Smoking. Nicotine products are available.  4. Dyslipidemia. He is on Zocor 20 mg daily.  5. Disposition. He will likely be discharged back with his mother. Follow-up with ACT team as usual..     Robbie Nangle 07/01/2014, 1:19 PM

## 2014-07-01 NOTE — Progress Notes (Signed)
D: Pt is awake and active in the milieu this evening. Pt mood is depressed and his affect is flat. Pt endorses passive Si but does contract for safety.  A: Writer provided encouragement and emotional support.  R: Pt forwards little but did state he thought God had forgiven him so that he could get a fresh start.

## 2014-07-01 NOTE — [facility] Group Notes (Signed)
[facility] LCSW Aftercare Discharge Planning Group Note  07/01/2014 10:13 AM  Participation Quality:  did not attend group  Affect:  n/a  Cognitive:  n/a  Insight:  n/a  Engagement in Group:  n/a  Modes of Intervention:  n/a  Summary of Progress/Problems:  Jeffrey [name] T 07/01/2014, 10:13 AM 

## 2014-07-01 NOTE — [facility] Group Notes (Signed)
[facility] LCSW Group Therapy  07/01/2014 2:50 PM  Type of Therapy:  Group Therapy  Participation Level:  Did Not Attend  Participation Quality:  n/a  Affect:  n/a  Cognitive:  n/a  Insight:  n/a  Engagement in Therapy:  n/a  Modes of Intervention:  n/a  Summary of Progress/Problems:  Jeffrey [name] T 07/01/2014, 2:50 PM

## 2014-07-01 NOTE — Progress Notes (Signed)
Pt visible on the unit. Mother visited this evening. Pt pleasant and cooperative. Attended group. Med compliant. Became irritable due to room being too hot. Needed reassurance working on getting it cooler.

## 2014-07-01 NOTE — [facility] Group Notes (Signed)
[facility] Group Notes:  (Nursing/MHT/Case Management/Adjunct)  Date:  07/01/2014  Time:  12:47 PM  Type of Therapy:  Psychoeducational Skills  Participation Level:  Did Not Attend  Mayra Neer 07/01/2014, 12:47 PM

## 2014-07-01 NOTE — Progress Notes (Signed)
Recreation Therapy Notes  At approximately 12:50 pm, LRT attempted assessment. Patient started assessment, then later said he did not want to talk about his personal business and requested LRT to leave.  Jacquelynn Cree, LRT/CTRS 07/01/2014 1:40 PM

## 2014-07-01 NOTE — Progress Notes (Signed)
Recreation Therapy Notes  Date: 06.10.16 Time: 3:00 pm Location: Craft Room  Group Topic: Communication, Problem solving, teamwork  Goal Area(s) Addresses:  Patient will effectively work with peer towards shared goal. Patient will identify skills used to make activity successful. Patient will identify benefit of using group skills effectively post d/c.  Behavioral Response: Did not attend  Intervention: Pipe Cleaner Tower  Activity: Patients were divided into groups and instructed to build the tallest free standing tower out of 15 pipe cleaners. After approximately 4 minutes of building, patients were told they had to put their dominant hand behind their back. After approximately 3 minutes, patients were told they could not talk to each other.  Education: LRT educated patients on how communication, problem solving, and teamwork goes into building a healthy support system.  Education Outcome: Patient did not attend group.  Clinical Observations/Feedback: Patient did not attend group.  Ivi Griffith M, LRT/CTRS 07/01/2014 4:12 PM 

## 2014-07-02 NOTE — [facility] Group Notes (Signed)
[facility] LCSW Group Therapy  07/02/2014 2:26 PM  Type of Therapy:  Group Therapy  Participation Level:  Did Not Attend  Modes of Intervention:  Discussion, Problem-solving, Socialization and Support  Summary of Progress/Problems:Patients identify obstacles, self-sabotaging and enabling behaviors. Patients explore aspects of self sabotage and enabling and how to limit these self-destructive behaviors in everyday life.   [name] L [name], MSW, LCSWA 07/02/2014, 2:26 PM  

## 2014-07-02 NOTE — [facility] Group Notes (Signed)
[facility] Group Notes:  (Nursing/MHT/Case Management/Adjunct)  Date:  07/02/2014  Time:  12:30 AM  Type of Therapy:  Group Therapy  Participation Level:  Minimal  Participation Quality:  Inattentive  Affect:  Flat  Cognitive:  Lacking  Insight:  None  Engagement in Group:  Left Early   Modes of Intervention:  n/a  Summary of Progress/Problems:  Veva Holes 07/02/2014, 12:30 AM

## 2014-07-02 NOTE — Tx Team (Signed)
Interdisciplinary Treatment Plan Update (Adult)  Date:  07/02/2014 Time Reviewed:  8:11 AM  Progress in Treatment: Attending groups: No. Participating in groups:  No. Taking medication as prescribed:  Yes. Tolerating medication:  Yes. Family/Significant othe contact made:  No- Family consent denied Patient understands diagnosis:  unknown Discussing patient identified problems/goals with staff:  yes Medical problems stabilized or resolved:  yes Denies suicidal/homicidal ideation: No. Issues/concerns per patient self-inventory:  No. Other:  New problem(s) identified: none  Discharge Plan or Barriers:Patient himself uncooperative with staff  Reason for Continuation of Hospitalization: Psychosis ,mania  Comments:  Estimated length of stay:7 days  New goal(s):To sleep  Review of initial/current patient goals per problem list:   Refer to plan of care  Attendees: Patient:  Jeffrey [name] June 9/16 9:45  Family:     Physician:  Dr Jennet Maduro June 9/16 9:45am  Nursing:   Amy RN June 9/16 9:45am  Case Manager:  Arrie Senate LCSW June 9/16 9:45am  Counselor:  Princella Ion LRT June 9/16 9:45am  Other:     Other:     Other:     Other:    Other:    Other:    Other:    Other:    Other:    Other:      Scribe for Treatment Team:   Cheron Schaumann, 07/02/2014, 8:11 AM

## 2014-07-02 NOTE — Progress Notes (Signed)
D: Pt is awake and active in the milieu this evening. Pt mood is labile and his affect is flat. Pt also had a visit from his mother tonight. It was reported that the pt intentionally attempts to vomit in cups or in the trash can in order to be inappropriate. Pt has poor insight, but is taking medications and follows staff instructions.   A: Writer provided emotional support and encouraged pt to continue following tx plan.  R: Pt apparently urinated on himself this evening, but his bed sheets were dry. It seems like he must have done so when not in bed. Staff washed clothes and pt returned to sleep.

## 2014-07-02 NOTE — [facility] Group Notes (Signed)
[facility] Group Notes:  (Nursing/MHT/Case Management/Adjunct)  Date:  07/02/2014  Time:  10:25 AM  Type of Therapy:  Community Meeting   Participation Level:  Did Not Attend  Summary of Progress/Problems:  Jeffrey [name] 07/02/2014, 10:25 AM

## 2014-07-02 NOTE — Progress Notes (Signed)
LCSW attempted to engage with patient who again refused to complete an assessment or treatment plan. Put himself back into bed

## 2014-07-02 NOTE — Progress Notes (Signed)
[facility] MD Progress Note  07/02/2014 12:47 PM Jeffrey [name]  MRN:  [id number]  Subjective:   Jeffrey [name] cut his throat with a razer in front of police officers.  Jeffrey [name] was uncooperative today he was found at 12:30 PM lying in bed and covered with blankets. He hardly open his eyes to speak with me. I know this patient from prior admissions to his usually noncooperative. Patient vaguely answered my questions.  He denied having any complaints today. Denies side effects from medications. Denies any physical complaints. Denies problems with mood, appetite, energy or concentration. Denies SI HI or auditory or visual hallucinations. Per nursing patient has not taking yet his morning medications as he has not gotten out of bed yet.  Principal Problem: Paranoid schizophrenia Diagnosis:   Patient Active Problem List   Diagnosis Date Noted  . Tobacco use disorder [Z72.0] 06/30/2014  . Paranoid schizophrenia [F20.0] 06/30/2014  . Hypertension [I10] 06/29/2014  . Hyponatremia [E87.1] 06/29/2014  . Laceration of neck [S11.91XA] 06/29/2014  . Suicidal behavior [F48.9] 06/29/2014   Total Time spent with patient: 20 minutes   Past Medical History:  Past Medical History  Diagnosis Date  . Schizophrenia   . Hypertension    History reviewed. No pertinent past surgical history. Family History: History reviewed. No pertinent family history. Social History:  History  Alcohol Use No     History  Drug Use No    History   Social History  . Marital Status: Single    Spouse Name: N/A  . Number of Children: N/A  . Years of Education: N/A   Social History Main Topics  . Smoking status: Current Every Day Smoker -- 1.00 packs/day    Types: Cigarettes  . Smokeless tobacco: Not on file  . Alcohol Use: No  . Drug Use: No  . Sexual Activity: Not on file   Other Topics Concern  . None   Social History Narrative   Additional History:    Sleep: Good  Appetite:  Good   Assessment:    Musculoskeletal: Strength & Muscle Tone: within normal limits Gait & Station: normal Patient leans: N/A   Psychiatric Specialty Exam: Physical Exam  Nursing note and vitals reviewed.   Review of Systems  Gastrointestinal: Negative.   Musculoskeletal: Negative.   Neurological: Negative.   Psychiatric/Behavioral: Negative.   All other systems reviewed and are negative.   Blood pressure 118/74, pulse 97, temperature 98.6 F (37 C), temperature source Oral, resp. rate 20, height 6' (1.829 m), weight 98.884 kg (218 lb), SpO2 97 %.Body mass index is 29.56 kg/(m^2).  General Appearance: Disheveled  Eye Contact::  Poor  Speech:  Slow  Volume:  Decreased  Mood:  Depressed  Affect:  Blunt  Thought Process:  Disorganized  Orientation:  Full (Time, Place, and Person)  Thought Content:  Delusions and Ilusions  Suicidal Thoughts:  Yes.  without intent/plan  Homicidal Thoughts:  No  Memory:  Immediate;   Fair Recent;   Fair Remote;   Fair  Judgement:  Fair  Insight:  Fair  Psychomotor Activity:  Normal  Concentration:  Fair  Recall:  [employer] of Knowledge:Fair  Language: Fair  Akathisia:  No  Handed:  Right  AIMS (if indicated):     Assets:  Desire for Improvement Financial Resources/Insurance Housing Social Support  ADL's:  Intact  Cognition: WNL  Sleep:  Number of Hours: 6.75     Current Medications: Current Facility-Administered Medications  Medication Dose Route Frequency Provider  Last Rate Last Dose  . acetaminophen (TYLENOL) tablet 650 mg  650 mg Oral Q6H PRN Audery Amel, MD      . alum & mag hydroxide-simeth (MAALOX/MYLANTA) 200-200-20 MG/5ML suspension 30 mL  30 mL Oral Q4H PRN Audery Amel, MD      . cloZAPine (CLOZARIL) tablet 100 mg  100 mg Oral Daily Audery Amel, MD   100 mg at 07/01/14 1011  . cloZAPine (CLOZARIL) tablet 400 mg  400 mg Oral QHS Audery Amel, MD   400 mg at 07/01/14 2101  . divalproex (DEPAKOTE) DR tablet 1,250 mg  1,250 mg  Oral Q12H Audery Amel, MD   1,250 mg at 07/01/14 2101  . magnesium hydroxide (MILK OF MAGNESIA) suspension 30 mL  30 mL Oral Daily PRN Audery Amel, MD      . nicotine (NICOTROL) 10 MG inhaler 1 continuous puffing  1 continuous puffing Inhalation PRN Audery Amel, MD      . simvastatin (ZOCOR) tablet 20 mg  20 mg Oral q1800 Audery Amel, MD   20 mg at 07/01/14 1710    Lab Results: No results found for this or any previous visit (from the past 48 hour(s)).  Physical Findings: AIMS: Facial and Oral Movements Muscles of Facial Expression: None, normal Lips and Perioral Area: None, normal Jaw: None, normal Tongue: None, normal,Extremity Movements Upper (arms, wrists, hands, fingers): None, normal Lower (legs, knees, ankles, toes): None, normal, Trunk Movements Neck, shoulders, hips: None, normal, Overall Severity Severity of abnormal movements (highest score from questions above): None, normal Incapacitation due to abnormal movements: None, normal Patient's awareness of abnormal movements (rate only patient's report): No Awareness, Dental Status Current problems with teeth and/or dentures?: No Does patient usually wear dentures?: No  CIWA:    COWS:     Treatment Plan Summary: Daily contact with patient to assess and evaluate symptoms and progress in treatment and Medication management   Medical Decision Making:  Established Problem, Stable/Improving (1), Review of Psycho-Social Stressors (1), Review or order clinical lab tests (1), Review of Medication Regimen & Side Effects (2) and Review of New Medication or Change in Dosage (2)   Jeffrey [name] is a 54 year old man with a history of schizophrenia admitted after a suicide attempt by cutting his neck in front of the police officer.  1. Suicidal ideation. The patient is on every 15 minutes checks.  2. Psychosis. He was restarted on clozapine 100 mg in the morning 400 at bedtime. It is unclear whether or not he was compliant with  medications in the community. So far were unable to talk to the mother. He is also taking Depakote for mood stabilization. Depakote level was not checked in the emergency room.  3. Smoking. Nicotine products are available.  4. Dyslipidemia. He is on Zocor 20 mg daily.  5. Disposition. He will likely be discharged back with his mother. Follow-up with ACT team as usual..     Jimmy Footman 07/02/2014, 12:47 PM

## 2014-07-03 MED ORDER — POLYETHYLENE GLYCOL 3350 17 G PO PACK
17.0000 g | PACK | Freq: Every day | ORAL | Status: DC
  Administered 2014-07-04: 17 g via ORAL
  Filled 2014-07-03: qty 1

## 2014-07-03 MED ORDER — SENNA 8.6 MG PO TABS
2.0000 | ORAL_TABLET | Freq: Every day | ORAL | Status: DC
  Administered 2014-07-03: 17.2 mg via ORAL
  Filled 2014-07-03: qty 2

## 2014-07-03 NOTE — Progress Notes (Addendum)
[facility] MD Progress Note  07/03/2014 10:07 AM [place]  MRN:  [id number]  Subjective:   Mr. Kimberlin cut his throat with a razer in front of police officers.  Mr. Pirro was uncooperative today he was found  lying in bed and covered with blankets. He hardly open his eyes to speak with me. I know this patient from prior admissions to his usually noncooperative. Patient vaguely answered my questions.  He denied having any complaints today. Denies side effects from medications. Denies any physical complaints. Denies problems with mood, appetite, energy or concentration. Denies SI HI or auditory or visual hallucinations. Patient's major concern today was wanting to be discharged. He requested for the psychiatrist to call his mother and arrange for him to go home.  Per nursing: Pt is awake and active in the milieu this evening. Pt mood is labile and his affect is flat. Pt also had a visit from his mother tonight. It was reported that the pt intentionally attempts to vomit in cups or in the trash can in order to be inappropriate. Pt has poor insight, but is taking medications and follows staff instructions. Also nurses report patient might have an episode of urinary incontinence.  Principal Problem: Paranoid schizophrenia Diagnosis:   Patient Active Problem List   Diagnosis Date Noted  . Tobacco use disorder [Z72.0] 06/30/2014  . Paranoid schizophrenia [F20.0] 06/30/2014  . Hypertension [I10] 06/29/2014  . Hyponatremia [E87.1] 06/29/2014  . Laceration of neck [S11.91XA] 06/29/2014  . Suicidal behavior [F48.9] 06/29/2014   Total Time spent with patient: 30 minutes   Past Medical History:  Past Medical History  Diagnosis Date  . Schizophrenia   . Hypertension    History reviewed. No pertinent past surgical history. Family History: History reviewed. No pertinent family history. Social History:  History  Alcohol Use No     History  Drug Use No    History   Social History  .  Marital Status: Single    Spouse Name: N/A  . Number of Children: N/A  . Years of Education: N/A   Social History Main Topics  . Smoking status: Current Every Day Smoker -- 1.00 packs/day    Types: Cigarettes  . Smokeless tobacco: Not on file  . Alcohol Use: No  . Drug Use: No  . Sexual Activity: Not on file   Other Topics Concern  . None   Social History Narrative   Additional History:    Sleep: Good  Appetite:  Good   Assessment:   Musculoskeletal: Strength & Muscle Tone: within normal limits Gait & Station: normal Patient leans: N/A   Psychiatric Specialty Exam: Physical Exam  Nursing note and vitals reviewed.   Review of Systems  Gastrointestinal: Negative.   Musculoskeletal: Negative.   Neurological: Negative.   Psychiatric/Behavioral: Negative.   All other systems reviewed and are negative.   Blood pressure 114/7, pulse 102, temperature 98.5 F (36.9 C), temperature source Oral, resp. rate 20, height 6' (1.829 m), weight 98.884 kg (218 lb), SpO2 97 %.Body mass index is 29.56 kg/(m^2).  General Appearance: Disheveled  Eye Contact::  Poor  Speech:  Slow  Volume:  Decreased  Mood:  Depressed  Affect:  Blunt  Thought Process:  Disorganized  Orientation:  Full (Time, Place, and Person)  Thought Content:  Delusions and Ilusions  Suicidal Thoughts:  Yes.  without intent/plan  Homicidal Thoughts:  No  Memory:  Immediate;   Fair Recent;   Fair Remote;   Fair  Judgement:  Fair  Insight:  Fair  Psychomotor Activity:  Normal  Concentration:  Fair  Recall:  [employer] of Knowledge:Fair  Language: Fair  Akathisia:  No  Handed:  Right  AIMS (if indicated):     Assets:  Desire for Improvement Financial Resources/Insurance Housing Social Support  ADL's:  Intact  Cognition: WNL  Sleep:  Number of Hours: 6.25     Current Medications: Current Facility-Administered Medications  Medication Dose Route Frequency Provider Last Rate Last Dose  .  acetaminophen (TYLENOL) tablet 650 mg  650 mg Oral Q6H PRN Audery Amel, MD      . alum & mag hydroxide-simeth (MAALOX/MYLANTA) 200-200-20 MG/5ML suspension 30 mL  30 mL Oral Q4H PRN Audery Amel, MD      . cloZAPine (CLOZARIL) tablet 100 mg  100 mg Oral Daily Audery Amel, MD   100 mg at 07/03/14 0940  . cloZAPine (CLOZARIL) tablet 400 mg  400 mg Oral QHS Audery Amel, MD   400 mg at 07/02/14 2125  . divalproex (DEPAKOTE) DR tablet 1,250 mg  1,250 mg Oral Q12H Audery Amel, MD   1,250 mg at 07/03/14 0941  . magnesium hydroxide (MILK OF MAGNESIA) suspension 30 mL  30 mL Oral Daily PRN Audery Amel, MD      . nicotine (NICOTROL) 10 MG inhaler 1 continuous puffing  1 continuous puffing Inhalation PRN Audery Amel, MD      . simvastatin (ZOCOR) tablet 20 mg  20 mg Oral q1800 Audery Amel, MD   20 mg at 07/01/14 1710    Lab Results: No results found for this or any previous visit (from the past 48 hour(s)).  Physical Findings: AIMS: Facial and Oral Movements Muscles of Facial Expression: None, normal Lips and Perioral Area: None, normal Jaw: None, normal Tongue: None, normal,Extremity Movements Upper (arms, wrists, hands, fingers): None, normal Lower (legs, knees, ankles, toes): None, normal, Trunk Movements Neck, shoulders, hips: None, normal, Overall Severity Severity of abnormal movements (highest score from questions above): None, normal Incapacitation due to abnormal movements: None, normal Patient's awareness of abnormal movements (rate only patient's report): No Awareness, Dental Status Current problems with teeth and/or dentures?: No Does patient usually wear dentures?: No  CIWA:    COWS:     Treatment Plan Summary: Daily contact with patient to assess and evaluate symptoms and progress in treatment and Medication management   Medical Decision Making:  Established Problem, Stable/Improving (1), Review of Psycho-Social Stressors (1), Review or order clinical lab  tests (1), Review of Medication Regimen & Side Effects (2) and Review of New Medication or Change in Dosage (2)   Mr. Trompeter is a 54 year old man with a history of schizophrenia admitted after a suicide attempt by cutting his neck in front of the police officer.  1. Suicidal ideation. The patient is on every 15 minutes checks.  2. Psychosis. He was restarted on clozapine 100 mg in the morning 400 at bedtime. It is unclear whether or not he was compliant with medications in the community. So far were unable to talk to the mother. He is also taking Depakote for mood stabilization. Depakote level was not checked in the emergency room.  3. Smoking. Nicotine products are available.  4. Dyslipidemia. He is on Zocor 20 mg daily.  Constipation: pt will be started on miralax q day and senokot qhs.  5. Disposition. He will likely be discharged back with his mother. Follow-up with ACT team as usual..  Jimmy Footman 07/03/2014, 10:07 AM

## 2014-07-03 NOTE — Progress Notes (Signed)
Pt visible on the unit. Visited with his mother. Spends time in the dayroom. Mood irritable. Attended group. Med compliant. Denies SI. Denies depression.

## 2014-07-03 NOTE — Plan of Care (Signed)
Problem: Ineffective individual coping Goal: STG: Patient will remain free from self harm Outcome: Progressing Pt denies SI     

## 2014-07-03 NOTE — Progress Notes (Signed)
Patient refused to complete assessment questions and remained asleep. This was the 2nd attempt today to request a PSA.

## 2014-07-03 NOTE — [facility] Group Notes (Signed)
[facility] LCSW Group Therapy  07/03/2014 2:39 PM  Type of Therapy:  Group Therapy  Participation Level:  Minimal  Participation Quality:  Inattentive  Affect:  Irritable  Cognitive:  Disorganized  Insight:  Limited  Engagement in Therapy:  Limited  Modes of Intervention:  Discussion, Exploration, Socialization and Support  Summary of Progress/Problems:Emotional Regulation: Patients will identify both negative and positive emotions. They will discuss emotions they have difficulty regulating and how they impact their lives. Patients will be asked to identify healthy coping skills to combat unhealthy reactions to negative emotions.  Pt attended group for a short amount of time. He was disorganized and difficult to follow.    Rondall Allegra, MSW, LCSWA 07/03/2014, 2:39 PM

## 2014-07-03 NOTE — [facility] Group Notes (Signed)
[facility] Group Notes:  (Nursing/MHT/Case Management/Adjunct)  Date:  07/03/2014  Time:  1:25 AM  Type of Therapy:  Group Therapy  Participation Level:  None  Summary of Progress/Problems:  Jeffrey [name] [name] 07/03/2014, 1:25 AM

## 2014-07-04 MED ORDER — CLOZAPINE 100 MG PO TABS: 100.0000 mg | ORAL_TABLET | Freq: Two times a day (BID) | ORAL | Status: DC

## 2014-07-04 MED ORDER — SENNA 8.6 MG PO TABS: 2.0000 | ORAL_TABLET | Freq: Every day | ORAL | Status: DC

## 2014-07-04 NOTE — Discharge Summary (Signed)
Physician Discharge Summary Note  Patient:  Jeffrey [name] is an 54 y.o., male MRN:  [id number] DOB:  Aug 29, 1960 Patient phone:  [phone] (home)  Patient address:   [address] [place] [address],  Total Time spent with patient: 30 minutes  Date of Admission:  06/29/2014 Date of Discharge: 07/04/3014   Reason for Admission:  Suicide attempt by cutting his neck.  Identifying data. Mr. Brennen is a 54 year old male with history of schizophrenia.  Chief complaint. The patient unable to state.  History of present illness. Information was obtained mostly from the chart at the patient is not forthcoming. He is irritated with [name] and tells me that I know everything about him already. He has been my patient multiple times in the past he may be right about some of the problems that am familiar with. According to the chart the patient was brought to the emergency room by the police. He cut his neck with a razor superficially in front of police officers. As she could not explain any of his actions and was unwilling or unable to answer questions in the emergency room as well. It is unclear whether or not he's been compliant with medications especially the Depakote level was not checked in the emergency room. We attempted to call Allen Lions his mother but the were unable to get in touch with her yet. The patient denies any problems with depression and anxiety or hallucinations. I do not think he should be trusted. He denies any physical problems. He does not appear to be using substances. Drug tox screen is negative.  Past psychiatric history. There were multiple hospitalizations for exacerbation of psychosis. He was recently hospitalized here in March. He was discharged on a combination of Depakote and Clozaril. He seems to do well on Clozaril. As she has been tried on multiple other medications in the past. And they're aware that several suicide attempts. The most serious one involved chopping off his  arm.  Family psychiatric history. None reported.  Social history. He is disabled from mental illness. He lives with his mother who is supportive. In the past he used to be a resident of group homes but the mother always takes him at home.  Principal Problem: Paranoid schizophrenia Discharge Diagnoses: Patient Active Problem List   Diagnosis Date Noted  . Tobacco use disorder [Z72.0] 06/30/2014  . Paranoid schizophrenia [F20.0] 06/30/2014  . Hypertension [I10] 06/29/2014  . Hyponatremia [E87.1] 06/29/2014  . Laceration of neck [S11.91XA] 06/29/2014  . Suicidal behavior [F48.9] 06/29/2014    Musculoskeletal: Strength & Muscle Tone: within normal limits Gait & Station: normal Patient leans: N/A  Psychiatric Specialty Exam: Physical Exam  Nursing note and vitals reviewed.   Review of Systems  All other systems reviewed and are negative.   Blood pressure 104/69, pulse 93, temperature 97.7 F (36.5 C), temperature source Oral, resp. rate 20, height 6' (1.829 m), weight 98.884 kg (218 lb), SpO2 97 %.Body mass index is 29.56 kg/(m^2).  See SRA.                                                  Sleep:  Number of Hours: 6.5   Have you used any form of tobacco in the last 30 days? (Cigarettes, Smokeless Tobacco, Cigars, and/or Pipes): Yes  Has this patient used any form of tobacco in the  last 30 days? (Cigarettes, Smokeless Tobacco, Cigars, and/or Pipes) Yes, A prescription for an FDA-approved tobacco cessation medication was offered at discharge and the patient refused  Past Medical History:  Past Medical History  Diagnosis Date  . Schizophrenia   . Hypertension    History reviewed. No pertinent past surgical history. Family History: History reviewed. No pertinent family history. Social History:  History  Alcohol Use No     History  Drug Use No    History   Social History  . Marital Status: Single    Spouse Name: N/A  . Number of Children: N/A  .  Years of Education: N/A   Social History Main Topics  . Smoking status: Current Every Day Smoker -- 1.00 packs/day    Types: Cigarettes  . Smokeless tobacco: Not on file  . Alcohol Use: No  . Drug Use: No  . Sexual Activity: Not on file   Other Topics Concern  . None   Social History Narrative    Past Psychiatric History: Hospitalizations:  Outpatient Care:  Substance Abuse Care:  Self-Mutilation:  Suicidal Attempts:  Violent Behaviors:   Risk to Self: Suicidal Ideation: Yes-Currently Present Suicidal Intent: Yes-Currently Present Is patient at risk for suicide?: Yes Suicidal Plan?: Yes-Currently Present Specify Current Suicidal Plan: Pt. attempted to cut his throat with a razor Access to Means: Yes Specify Access to Suicidal Means: Have access to razors What has been your use of drugs/alcohol within the last 12 months?: None Reported How many times?: 1 Other Self Harm Risks: None Reported Triggers for Past Attempts: Unknown Intentional Self Injurious Behavior: None Risk to Others: Homicidal Ideation: No Thoughts of Harm to Others: No Current Homicidal Intent: No Current Homicidal Plan: No Access to Homicidal Means: No Identified Victim: None Reported History of harm to others?: No Assessment of Violence: None Noted Violent Behavior Description: None Reported Does patient have access to weapons?: No Criminal Charges Pending?: No Does patient have a court date: No Prior Inpatient Therapy: Prior Inpatient Therapy: Yes Prior Therapy Dates: 02/2014 & 10/2013 Prior Therapy Facilty/Provider(s): [facility] Reason for Treatment: Schizophrenia  Prior Outpatient Therapy: Prior Outpatient Therapy: No Prior Therapy Dates: Unknown Prior Therapy Facilty/Provider(s): Unknown Reason for Treatment: Unknown Does patient have an ACCT team?: No Does patient have Intensive In-House Services?  : No Does patient have [facility] services? : No Does patient have [facility] services?:  No  Level of Care:  OP  Hospital Course:    Mr. Button is a 54 year old male with a history of schizophrenia admitted after a suicide attempt by cutting his neck in front of the police officer.  1. Suicidal ideation. This has resolved. The patient is able to contract for safety.   2. Psychosis. He was restarted on clozapine 100 mg in the morning 400 at bedtime. Clo+NorClo level 373. He is also taking Depakote for mood stabilization. VPA 83.   3. Smoking. Nicotine products were available.  4. Dyslipidemia. He is on Zocor 20 mg daily.  5. Constipation. He was started on bowel regimen.  6. Disposition. He will likely be discharged back with his mother. Follow-up with ACT team as usual..  Consults:  None  Significant Diagnostic Studies:  None  Discharge Vitals:   Blood pressure 104/69, pulse 93, temperature 97.7 F (36.5 C), temperature source Oral, resp. rate 20, height 6' (1.829 m), weight 98.884 kg (218 lb), SpO2 97 %. Body mass index is 29.56 kg/(m^2). Lab Results:   No results found for this or any previous  visit (from the past 72 hour(s)).  Physical Findings: AIMS: Facial and Oral Movements Muscles of Facial Expression: None, normal Lips and Perioral Area: None, normal Jaw: None, normal Tongue: None, normal,Extremity Movements Upper (arms, wrists, hands, fingers): None, normal Lower (legs, knees, ankles, toes): None, normal, Trunk Movements Neck, shoulders, hips: None, normal, Overall Severity Severity of abnormal movements (highest score from questions above): None, normal Incapacitation due to abnormal movements: None, normal Patient's awareness of abnormal movements (rate only patient's report): No Awareness, Dental Status Current problems with teeth and/or dentures?: No Does patient usually wear dentures?: No  CIWA:    COWS:      See Psychiatric Specialty Exam and Suicide Risk Assessment completed by Attending Physician prior to discharge.  Discharge  destination:  Home  Is patient on multiple antipsychotic therapies at discharge:  No   Has Patient had three or more failed trials of antipsychotic monotherapy by history:  No    Recommended Plan for Multiple Antipsychotic Therapies: NA  Discharge Instructions    Diet - low sodium heart healthy    Complete by:  As directed      Increase activity slowly    Complete by:  As directed             Medication List    TAKE these medications      Indication   atenolol 50 MG tablet  Commonly known as:  TENORMIN  Take 50 mg by mouth daily.      cloZAPine 100 MG tablet  Commonly known as:  CLOZARIL  Take 1-4 tablets (100-400 mg total) by mouth 2 (two) times daily. Patient takes 1 tablet (100 mg) in the morning and 4 tablets (400 mg) at bedtime.   Indication:  Schizophrenia     divalproex 500 MG DR tablet  Commonly known as:  DEPAKOTE  Take 1,000-1,500 mg by mouth 2 (two) times daily. Patient takes 2 tablets (1000 mg) in the morning and 3 tablets (1500 mg) at bedtime.      senna 8.6 MG Tabs tablet  Commonly known as:  SENOKOT  Take 2 tablets (17.2 mg total) by mouth at bedtime.      simvastatin 40 MG tablet  Commonly known as:  ZOCOR  Take 40 mg by mouth at bedtime.          Follow-up recommendations:  Activity:  As tolerated. Diet:  Low sodium heart healthy. Other:  Keep Follow-up appointments.  Comments:    Total Discharge Time: 35 min.  Signed: Bertran Zeimet 07/04/2014, 11:59 AM

## 2014-07-04 NOTE — Progress Notes (Signed)
[facility] H&P Discharge Summary faxed to [facility] for hospital follow-up °

## 2014-07-04 NOTE — Progress Notes (Signed)
  [facility] Adult Case Management Discharge Plan :  Will you be returning to the same living situation after discharge:  Yes,  home with his mother At discharge, do you have transportation home?: Yes,  patient's mother will pick him up at discharge Do you have the ability to pay for your medications: Yes,  patient has insurance  Release of information consent forms completed and in the chart;  Patient's signature needed at discharge.  Patient to Follow up at: Follow-up Information    Follow up with [facility].   Why:  For follow-up care; ACT team will see patient Tuesday 07/05/14   Contact information:   [address] [place], [place] Ph [phone] Fax [phone]       Patient denies SI/HI: Yes,  patient denies SI/HI    Safety Planning and Suicide Prevention discussed: Yes,  SPE discussed with patient and his mother  Have you used any form of tobacco in the last 30 days? (Cigarettes, Smokeless Tobacco, Cigars, and/or Pipes): Yes  Has patient been referred to the Quitline?: Patient refused referral  Beryl Meager T 07/04/2014, 12:22 PM

## 2014-07-04 NOTE — [facility] Suicide Risk Assessment (Signed)
[facility] INPATIENT:  Family/Significant Other Suicide Prevention Education  Suicide Prevention Education:  Education Completed; Fard Jacek (mother) [phone] has been identified by the patient as the family member/significant other with whom the patient will be residing, and identified as the person(s) who will aid the patient in the event of a mental health crisis (suicidal ideations/suicide attempt).  With written consent from the patient, the family member/significant other has been provided the following suicide prevention education, prior to the and/or following the discharge of the patient.  The suicide prevention education provided includes the following:  Suicide risk factors  Suicide prevention and interventions  National Suicide Hotline telephone number  [facility] assessment telephone number  [facility] Emergency Assistance 911  [facility] and/or Residential Mobile Crisis Unit telephone number  Request made of family/significant other to:  Remove weapons (e.g., guns, rifles, knives), all items previously/currently identified as safety concern.    Remove drugs/medications (over-the-counter, prescriptions, illicit drugs), all items previously/currently identified as a safety concern.  The family member/significant other verbalizes understanding of the suicide prevention education information provided.  The family member/significant other agrees to remove the items of safety concern listed above.  Beryl Meager T 07/04/2014, 12:21 PM

## 2014-07-04 NOTE — Progress Notes (Signed)
Patient denies SI/HI, denies A/V hallucinations. Patient verbalizes understanding of discharge instructions, follow up care and prescriptions. Patient given all belongings from  locker. Patient escorted out by staff, transported by family. 

## 2014-07-04 NOTE — [facility] Group Notes (Signed)
[facility] Group Notes:  (Nursing/MHT/Case Management/Adjunct)  Date:  07/04/2014  Time:  3:08 PM  Type of Therapy:  Psychoeducational Skills  Participation Level:  Did Not Attend   Lynelle Smoke [facility] 07/04/2014, 3:08 PM

## 2014-07-04 NOTE — [facility] Suicide Risk Assessment (Signed)
[facility] Discharge Suicide Risk Assessment   Demographic Factors:  Male and Caucasian  Total Time spent with patient: 30 minutes  Musculoskeletal: Strength & Muscle Tone: within normal limits Gait & Station: normal Patient leans: N/A  Psychiatric Specialty Exam: Physical Exam  Nursing note and vitals reviewed.   Review of Systems  All other systems reviewed and are negative.   Blood pressure 104/69, pulse 93, temperature 97.7 F (36.5 C), temperature source Oral, resp. rate 20, height 6' (1.829 m), weight 98.884 kg (218 lb), SpO2 97 %.Body mass index is 29.56 kg/(m^2).  General Appearance: Casual  Eye Contact::  Fair  Speech:  Blocked409  Volume:  Normal  Mood:  Euthymic  Affect:  Appropriate  Thought Process:  Linear and Logical  Orientation:  Full (Time, Place, and Person)  Thought Content:  WDL  Suicidal Thoughts:  No  Homicidal Thoughts:  No  Memory:  Immediate;   Fair Recent;   Fair Remote;   Fair  Judgement:  Fair  Insight:  Fair  Psychomotor Activity:  Normal  Concentration:  Fair  Recall:  [employer] of Knowledge:Fair  Language: Fair  Akathisia:  No  Handed:  Right  AIMS (if indicated):     Assets:  Desire for Improvement Financial Resources/Insurance Housing Physical Health Resilience Social Support  Sleep:  Number of Hours: 6.5  Cognition: WNL  ADL's:  Intact   Have you used any form of tobacco in the last 30 days? (Cigarettes, Smokeless Tobacco, Cigars, and/or Pipes): Yes  Has this patient used any form of tobacco in the last 30 days? (Cigarettes, Smokeless Tobacco, Cigars, and/or Pipes) Yes, A prescription for an FDA-approved tobacco cessation medication was offered at discharge and the patient refused  Mental Status Per Nursing Assessment::   On Admission:     Current Mental Status by Physician: NA  Loss Factors: NA  Historical Factors: Prior suicide attempts and Impulsivity  Risk Reduction Factors:   Sense of responsibility to family,  Positive social support and Positive therapeutic relationship  Continued Clinical Symptoms:  Schizophrenia:   Depressive state  Cognitive Features That Contribute To Risk:  None    Suicide Risk:  Minimal: No identifiable suicidal ideation.  Patients presenting with no risk factors but with morbid ruminations; may be classified as minimal risk based on the severity of the depressive symptoms  Principal Problem: Paranoid schizophrenia Discharge Diagnoses:  Patient Active Problem List   Diagnosis Date Noted  . Tobacco use disorder [Z72.0] 06/30/2014  . Paranoid schizophrenia [F20.0] 06/30/2014  . Hypertension [I10] 06/29/2014  . Hyponatremia [E87.1] 06/29/2014  . Laceration of neck [S11.91XA] 06/29/2014  . Suicidal behavior [F48.9] 06/29/2014      Plan Of Care/Follow-up recommendations:  Activity:  As tolerated. Diet:  Low sodium heart healthy. Other:  Keep follow-up appointments.  Is patient on multiple antipsychotic therapies at discharge:  No   Has Patient had three or more failed trials of antipsychotic monotherapy by history:  No  Recommended Plan for Multiple Antipsychotic Therapies: NA    Jolanta Pucilowska 07/04/2014, 11:45 AM

## 2014-11-06 ENCOUNTER — Emergency Department
Admission: EM | Admit: 2014-11-06 | Discharge: 2014-11-07 | Disposition: A | Discharge status: Home / Self Care | Payer: Medicare Other | Attending: Emergency Medicine | Admitting: Emergency Medicine

## 2014-11-06 DIAGNOSIS — I10 Essential (primary) hypertension: Secondary | ICD-10-CM | POA: Insufficient documentation

## 2014-11-06 DIAGNOSIS — F29 Unspecified psychosis not due to a substance or known physiological condition: Secondary | ICD-10-CM | POA: Diagnosis not present

## 2014-11-06 DIAGNOSIS — F200 Paranoid schizophrenia: Secondary | ICD-10-CM | POA: Diagnosis not present

## 2014-11-06 DIAGNOSIS — F17200 Nicotine dependence, unspecified, uncomplicated: Secondary | ICD-10-CM | POA: Diagnosis present

## 2014-11-06 DIAGNOSIS — R451 Restlessness and agitation: Secondary | ICD-10-CM | POA: Diagnosis not present

## 2014-11-06 DIAGNOSIS — Z720 Tobacco use: Secondary | ICD-10-CM | POA: Insufficient documentation

## 2014-11-06 DIAGNOSIS — F911 Conduct disorder, childhood-onset type: Secondary | ICD-10-CM | POA: Diagnosis present

## 2014-11-06 DIAGNOSIS — F209 Schizophrenia, unspecified: Secondary | ICD-10-CM | POA: Diagnosis not present

## 2014-11-06 DIAGNOSIS — F23 Brief psychotic disorder: Secondary | ICD-10-CM

## 2014-11-06 LAB — CBC WITH DIFFERENTIAL/PLATELET
Basophils Absolute: 0.1 10*3/uL (ref 0–0.1)
Basophils Relative: 1 %
Eosinophils Absolute: 0 10*3/uL (ref 0–0.7)
Eosinophils Relative: 0 %
HEMATOCRIT: 44.5 % (ref 40.0–52.0)
HEMOGLOBIN: 15.7 g/dL (ref 13.0–18.0)
LYMPHS ABS: 2.4 10*3/uL (ref 1.0–3.6)
Lymphocytes Relative: 23 %
MCH: 30.9 pg (ref 26.0–34.0)
MCHC: 35.2 g/dL (ref 32.0–36.0)
MCV: 87.9 fL (ref 80.0–100.0)
MONOS PCT: 11 %
Monocytes Absolute: 1.1 10*3/uL — ABNORMAL HIGH (ref 0.2–1.0)
NEUTROS ABS: 6.9 10*3/uL — AB (ref 1.4–6.5)
NEUTROS PCT: 65 %
Platelets: 249 10*3/uL (ref 150–440)
RBC: 5.06 MIL/uL (ref 4.40–5.90)
RDW: 13.9 % (ref 11.5–14.5)
WBC: 10.5 10*3/uL (ref 3.8–10.6)

## 2014-11-06 LAB — COMPREHENSIVE METABOLIC PANEL
ALBUMIN: 4 g/dL (ref 3.5–5.0)
ALK PHOS: 56 U/L (ref 38–126)
ALT: 13 U/L — AB (ref 17–63)
ANION GAP: 9 (ref 5–15)
AST: 15 U/L (ref 15–41)
BUN: 11 mg/dL (ref 6–20)
CHLORIDE: 97 mmol/L — AB (ref 101–111)
CO2: 23 mmol/L (ref 22–32)
Calcium: 9.1 mg/dL (ref 8.9–10.3)
Creatinine, Ser: 0.78 mg/dL (ref 0.61–1.24)
GFR calc non Af Amer: >60 mL/min (ref >60)
GLUCOSE: 112 mg/dL — AB (ref 65–99)
Potassium: 4.3 mmol/L (ref 3.5–5.1)
Sodium: 129 mmol/L — ABNORMAL LOW (ref 135–145)
Total Bilirubin: 0.4 mg/dL (ref 0.3–1.2)
Total Protein: 6.7 g/dL (ref 6.5–8.1)

## 2014-11-06 LAB — URINALYSIS COMPLETE WITH MICROSCOPIC (ARMC ONLY)
BACTERIA UA: NONE SEEN
Bilirubin Urine: NEGATIVE
Glucose, UA: NEGATIVE mg/dL
HGB URINE DIPSTICK: NEGATIVE
LEUKOCYTES UA: NEGATIVE
NITRITE: NEGATIVE
PH: 6 (ref 5.0–8.0)
SPECIFIC GRAVITY, URINE: 1.026 (ref 1.005–1.030)

## 2014-11-06 LAB — URINE DRUG SCREEN, QUALITATIVE (ARMC ONLY)
Amphetamines, Ur Screen: NOT DETECTED
BARBITURATES, UR SCREEN: NOT DETECTED
BENZODIAZEPINE, UR SCRN: NOT DETECTED
Cannabinoid 50 Ng, Ur ~~LOC~~: NOT DETECTED
Cocaine Metabolite,Ur ~~LOC~~: NOT DETECTED
MDMA (Ecstasy)Ur Screen: NOT DETECTED
Methadone Scn, Ur: NOT DETECTED
OPIATE, UR SCREEN: NOT DETECTED
PHENCYCLIDINE (PCP) UR S: NOT DETECTED
Tricyclic, Ur Screen: NOT DETECTED

## 2014-11-06 LAB — ETHANOL

## 2014-11-06 MED ORDER — DIVALPROEX SODIUM 500 MG PO DR TAB: 500.0000 mg | DELAYED_RELEASE_TABLET | Freq: Two times a day (BID) | ORAL | Status: DC

## 2014-11-06 MED ORDER — CLOZAPINE 100 MG PO TABS
200.0000 mg | ORAL_TABLET | Freq: Two times a day (BID) | ORAL | Status: DC
  Administered 2014-11-06 – 2014-11-07 (×2): 200 mg via ORAL
  Filled 2014-11-06: qty 8
  Filled 2014-11-06: qty 2

## 2014-11-06 MED ORDER — DIVALPROEX SODIUM 500 MG PO DR TAB
1000.0000 mg | DELAYED_RELEASE_TABLET | Freq: Two times a day (BID) | ORAL | Status: DC
  Administered 2014-11-06 – 2014-11-07 (×2): 1000 mg via ORAL
  Filled 2014-11-06 (×2): qty 2
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
## 2014-11-06 NOTE — ED Notes (Signed)

## 2014-11-06 NOTE — ED Notes (Signed)
BEHAVIORAL HEALTH ROUNDING Patient sleeping: Yes.   Patient alert and oriented: yes Behavior appropriate: Yes.  ; If no, describe:  Nutrition and fluids offered: sleeping Toileting and hygiene offered: sleeping Sitter present: no Law enforcement present: Yes  

## 2014-11-06 NOTE — ED Notes (Signed)
BEHAVIORAL HEALTH ROUNDING Patient sleeping: No. Patient alert and oriented: yes Behavior appropriate: Yes.  ; If no, describe:  Nutrition and fluids offered: Yes  Toileting and hygiene offered: Yes  Sitter present: yes Law enforcement present: Yes  

## 2014-11-06 NOTE — ED Notes (Signed)
BEHAVIORAL HEALTH ROUNDING Patient sleeping: No. Patient alert and oriented: yes Behavior appropriate: No.; If no, describe: talking to himself, and the tv,  Nutrition and fluids offered: Yes  Toileting and hygiene offered: Yes  Sitter present: yes Law enforcement present: Yes
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
## 2014-11-06 NOTE — ED Notes (Signed)
BEHAVIORAL HEALTH ROUNDING Patient sleeping: Yes.   Patient alert and oriented: not applicable Behavior appropriate: Yes.  ; If no, describe:  Nutrition and fluids offered: Yes  Toileting and hygiene offered: Yes  Sitter present: yes Law enforcement present: Yes  
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
## 2014-11-06 NOTE — ED Notes (Signed)
clozaril ordered from pharmacy

## 2014-11-07 ENCOUNTER — Inpatient Hospital Stay
Admission: EM | Admit: 2014-11-07 | Discharge: 2014-11-18 | DRG: 885 | Disposition: A | Discharge status: Home / Self Care | Payer: 59 | Source: Intra-hospital | Attending: Psychiatry | Admitting: Psychiatry

## 2014-11-07 DIAGNOSIS — I10 Essential (primary) hypertension: Secondary | ICD-10-CM | POA: Diagnosis present

## 2014-11-07 DIAGNOSIS — Z9114 Patient's other noncompliance with medication regimen: Secondary | ICD-10-CM | POA: Diagnosis not present

## 2014-11-07 DIAGNOSIS — Z79899 Other long term (current) drug therapy: Secondary | ICD-10-CM | POA: Diagnosis not present

## 2014-11-07 DIAGNOSIS — F329 Major depressive disorder, single episode, unspecified: Secondary | ICD-10-CM | POA: Diagnosis present

## 2014-11-07 DIAGNOSIS — F17200 Nicotine dependence, unspecified, uncomplicated: Secondary | ICD-10-CM | POA: Diagnosis present

## 2014-11-07 DIAGNOSIS — Z9119 Patient's noncompliance with other medical treatment and regimen: Secondary | ICD-10-CM

## 2014-11-07 DIAGNOSIS — K5900 Constipation, unspecified: Secondary | ICD-10-CM | POA: Diagnosis present

## 2014-11-07 DIAGNOSIS — F200 Paranoid schizophrenia: Secondary | ICD-10-CM | POA: Diagnosis present

## 2014-11-07 DIAGNOSIS — F201 Disorganized schizophrenia: Secondary | ICD-10-CM | POA: Diagnosis not present

## 2014-11-07 DIAGNOSIS — E785 Hyperlipidemia, unspecified: Secondary | ICD-10-CM | POA: Diagnosis present

## 2014-11-07 DIAGNOSIS — F209 Schizophrenia, unspecified: Secondary | ICD-10-CM | POA: Diagnosis present

## 2014-11-07 LAB — SODIUM: Sodium: 129 mmol/L — ABNORMAL LOW (ref 135–145)

## 2014-11-07 MED ORDER — DIVALPROEX SODIUM 500 MG PO DR TAB
1500.0000 mg | DELAYED_RELEASE_TABLET | Freq: Every day | ORAL | Status: DC
Start: 2014-11-07 — End: 2014-11-07
  Administered 2014-11-07: 1500 mg via ORAL

## 2014-11-07 MED ORDER — SIMVASTATIN 40 MG PO TABS
20.0000 mg | ORAL_TABLET | Freq: Every day | ORAL | Status: DC
  Administered 2014-11-07: 20 mg via ORAL

## 2014-11-07 MED ORDER — SIMVASTATIN 40 MG PO TABS
ORAL_TABLET | ORAL | Status: AC
Start: 2014-11-07 — End: 2014-11-07
  Administered 2014-11-07: 20 mg via ORAL
  Filled 2014-11-07: qty 1

## 2014-11-07 MED ORDER — SENNOSIDES-DOCUSATE SODIUM 8.6-50 MG PO TABS
ORAL_TABLET | ORAL | Status: AC
Start: 2014-11-07 — End: 2014-11-07
  Administered 2014-11-07: 2
  Filled 2014-11-07: qty 2

## 2014-11-07 MED ORDER — SENNA 8.6 MG PO TABS
2.0000 | ORAL_TABLET | Freq: Every day | ORAL | Status: DC
  Administered 2014-11-07: 17.2 mg via ORAL

## 2014-11-07 MED ORDER — CLOZAPINE 100 MG PO TABS
ORAL_TABLET | ORAL | Status: AC
  Administered 2014-11-07: 400 mg via ORAL
  Filled 2014-11-07: qty 4

## 2014-11-07 MED ORDER — DIVALPROEX SODIUM ER 500 MG PO TB24
ORAL_TABLET | ORAL | Status: AC
Start: 2014-11-07 — End: 2014-11-07
  Administered 2014-11-07: 1500 mg
  Filled 2014-11-07: qty 3

## 2014-11-07 MED ORDER — CLOZAPINE 100 MG PO TABS: 100.0000 mg | ORAL_TABLET | Freq: Every day | ORAL | Status: DC

## 2014-11-07 MED ORDER — CLOZAPINE 100 MG PO TABS
400.0000 mg | ORAL_TABLET | Freq: Every day | ORAL | Status: DC
  Administered 2014-11-07: 400 mg via ORAL

## 2014-11-07 MED ORDER — NICOTINE 21 MG/24HR TD PT24
MEDICATED_PATCH | TRANSDERMAL | Status: AC
  Administered 2014-11-07: 21 mg via TRANSDERMAL
  Filled 2014-11-07: qty 1

## 2014-11-07 MED ORDER — ATENOLOL 25 MG PO TABS
ORAL_TABLET | ORAL | Status: AC
  Administered 2014-11-07: 50 mg via ORAL
  Filled 2014-11-07: qty 2

## 2014-11-07 MED ORDER — NICOTINE 21 MG/24HR TD PT24
21.0000 mg | MEDICATED_PATCH | Freq: Every day | TRANSDERMAL | Status: DC
  Administered 2014-11-07: 21 mg via TRANSDERMAL

## 2014-11-07 MED ORDER — DIVALPROEX SODIUM 500 MG PO DR TAB: 1000.0000 mg | DELAYED_RELEASE_TABLET | ORAL | Status: DC

## 2014-11-07 MED ORDER — ATENOLOL 25 MG PO TABS
50.0000 mg | ORAL_TABLET | Freq: Every day | ORAL | Status: DC
  Administered 2014-11-07: 50 mg via ORAL

## 2014-11-07 NOTE — ED Notes (Signed)
Patient resting quietly in room. No noted distress or abnormal behaviors noted. Will continue 15 minute checks and observation by security camera for safety. 

## 2014-11-07 NOTE — ED Notes (Signed)

## 2014-11-07 NOTE — ED Notes (Signed)
BEHAVIORAL HEALTH ROUNDING Patient sleeping: Yes.   Patient alert and oriented: yes Behavior appropriate: Yes.  ; If no, describe:  Nutrition and fluids offered: sleeping Toileting and hygiene offered: sleeping Sitter present: no Law enforcement present: Yes  

## 2014-11-07 NOTE — ED Provider Notes (Signed)
-----------------------------------------   7:25 AM on 11/07/2014 -----------------------------------------   Blood pressure 135/90, pulse 76, temperature 98.5 F (36.9 C), temperature source Oral, resp. rate 18, height 5\' 9"  (1.753 m), weight 200 lb (90.719 kg), SpO2 97 %.  The patient had no acute events since last update.  Calm and cooperative at this time.  Disposition is pending per Psychiatry/Behavioral Medicine team recommendations.     Irean HongJade J Sung, MD 11/07/14 [phone]

## 2014-11-07 NOTE — ED Notes (Signed)
Pt cooperative taking medications

## 2014-11-07 NOTE — Progress Notes (Signed)
MEDICATION RELATED CONSULT NOTE - INITIAL   Pharmacy Consult for CLOZAPINE Indication: schizophrenia  No Known Allergies  Patient Measurements: Height: 5\' 9"  (175.3 cm) Weight: 200 lb (90.719 kg) IBW/kg (Calculated) : 70.7 Adjusted Body Weight:   Vital Signs:   Intake/Output from previous day:   Intake/Output from this shift:    Labs:  Recent Labs  11/06/14 1925  WBC 10.5  HGB 15.7  HCT 44.5  PLT 249  CREATININE 0.78  ALBUMIN 4.0  PROT 6.7  AST 15  ALT 13*  ALKPHOS 56  BILITOT 0.4    10/16 ANC=6.9  Estimated Creatinine Clearance: 118.9 mL/min (by C-G formula based on Cr of 0.78).   Microbiology: No results found for this or any previous visit (from the past 720 hour(s)).  Medical History: Past Medical History  Diagnosis Date  . Schizophrenia   . Hypertension     Medications:  Scheduled:  . cloZAPine  200 mg Oral BID  . divalproex  1,000 mg Oral Q12H    Assessment: 54 yo male ordered Clozapine 200mg  po bid  Plan:  10/16  WBC 10.5  ANC 6.9.    ANC reported to Clozapine [facility] registry. Continue to  monitor weekly.   Will recheck CBC with Diff on 11/13/14.    Bari MantisKristin Cielo Arias PharmD Clinical Pharmacist 11/07/2014  8:03 AM

## 2014-11-07 NOTE — ED Notes (Signed)
Pt. Noted in room awaiting transport for inpatient services; has accepted pm medications without difficulty. No complaints or concerns voiced. No distress or abnormal behavior noted. Will continue to monitor with security cameras. Q 15 minute rounds continue.

## 2014-11-07 NOTE — ED Notes (Signed)
BEHAVIORAL HEALTH ROUNDING Patient sleeping: No. Patient alert and oriented: yes Behavior appropriate: Yes.  ; If no, describe:  Nutrition and fluids offered: Yes  Toileting and hygiene offered: Yes  Sitter present: no Law enforcement present: Yes  

## 2014-11-07 NOTE — ED Notes (Signed)
Snack and beverage given. 

## 2014-11-07 NOTE — ED Notes (Signed)
Pt. Noted in room resting quietly;. No complaints or concerns voiced. No distress or abnormal behavior noted. Will continue to monitor with security cameras. Q 15 minute rounds continue. 

## 2014-11-07 NOTE — ED Notes (Signed)
Pt sleeping. 

## 2014-11-07 NOTE — ED Notes (Signed)
BEHAVIORAL HEALTH ROUNDING Patient sleeping: Yes.   Patient alert and oriented: sleeping Behavior appropriate: Yes.  ; If no, describe:  Nutrition and fluids offered: sleeping Toileting and hygiene offered: sleeping Sitter present: no Law enforcement present: Yes  

## 2014-11-07 NOTE — ED Notes (Signed)
BEHAVIORAL HEALTH ROUNDING Patient sleeping: Yes.   Patient alert and oriented: yes Behavior appropriate: Yes.  ; If no, describe:  Nutrition and fluids offered: sleeping Toileting and hygiene offered: No Sitter present: no Law enforcement present: Yes  

## 2014-11-07 NOTE — ED Notes (Signed)
Pt received dinner tray.

## 2014-11-07 NOTE — ED Notes (Signed)
ED BHU PLACEMENT JUSTIFICATION Is the patient under IVC or is there intent for IVC: Yes.   Is the patient medically cleared: No. Is there vacancy in the ED BHU: no Is the population mix appropriate for patient: Yes.   Is the patient awaiting placement in inpatient or outpatient setting: Yes.   Has the patient had a psychiatric consult: No. Survey of unit performed for contraband, proper placement and condition of furniture, tampering with fixtures in bathroom, shower, and each patient room: Yes.  ; Findings:  APPEARANCE/BEHAVIOR calm and cooperative NEURO ASSESSMENT Orientation: sleeping Hallucinations: No.None noted (Hallucinations) Speech: Normal Gait: normal RESPIRATORY ASSESSMENT Normal expansion.  Clear to auscultation.  No rales, rhonchi, or wheezing. CARDIOVASCULAR ASSESSMENT regular rate and rhythm, S1, S2 normal, no murmur, click, rub or gallop GASTROINTESTINAL ASSESSMENT soft, nontender, BS WNL, no r/g EXTREMITIES normal strength, tone, and muscle mass PLAN OF CARE Provide calm/safe environment. Vital signs assessed twice daily. ED BHU Assessment once each 12-hour shift. Collaborate with intake RN daily or as condition indicates. Assure the ED provider has rounded once each shift. Provide and encourage hygiene. Provide redirection as needed. Assess for escalating behavior; address immediately and inform ED provider.  Assess family dynamic and appropriateness for visitation as needed: Yes.  ; If necessary, describe findings:  Educate the patient/family about BHU procedures/visitation: Yes.  ; If necessary, describe findings:

## 2014-11-07 NOTE — ED Notes (Signed)
Patient asleep in room. No noted distress or abnormal behavior. Will continue 15 minute checks and observation by security cameras for safety. 

## 2014-11-07 NOTE — ED Notes (Signed)
ENVIRONMENTAL ASSESSMENT Potentially harmful objects out of patient reach: Yes Personal belongings secured: Yes Patient dressed in hospital provided attire only: Yes Plastic bags out of patient reach: Yes Patient care equipment (cords, cables, call bells, lines, and drains) shortened, removed, or accounted for: Yes Equipment and supplies removed from bottom of stretcher: Yes Potentially toxic materials out of patient reach: Yes Sharps container removed or out of patient reach: Yes  Maintained on 15 minute checks and observation by security camera for safety.  

## 2014-11-07 NOTE — ED Notes (Signed)
Patient took all medications without incident. No current s/s psychosis. Patient is alert and oriented, calm and cooperative. Denies hallucinations at present. Maintained on 15 minute checks and observation by security camera for safety.

## 2014-11-07 NOTE — ED Notes (Signed)
Report received from Amy H., RN; . Pt. Alert and oriented in no distress denies SI, HI, AVH and pain.  Pt. Instructed to come to me with problems or concerns.Will continue to monitor for safety via security cameras and Q 15 minute checks. 
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
## 2014-11-07 NOTE — ED Notes (Signed)
BEHAVIORAL HEALTH ROUNDING Patient sleeping: Yes.   Patient alert and oriented: sleeping Behavior appropriate: Yes.  ; If no, describe:  Nutrition and fluids offered: sleeping  Toileting and hygiene offered: Yes  Sitter present: no Law enforcement present: Yes

## 2014-11-08 ENCOUNTER — Encounter: Payer: Self-pay | Admitting: Psychiatry

## 2014-11-08 DIAGNOSIS — F201 Disorganized schizophrenia: Secondary | ICD-10-CM

## 2014-11-08 LAB — VALPROIC ACID LEVEL: VALPROIC ACID LVL: 77 ug/mL (ref 50.0–100.0)

## 2014-11-08 LAB — TSH: TSH: 0.577 u[IU]/mL (ref 0.350–4.500)

## 2014-11-08 LAB — HEMOGLOBIN A1C: Hgb A1c MFr Bld: 5.4 % (ref 4.0–6.0)

## 2014-11-08 LAB — LIPID PANEL
CHOLESTEROL: 147 mg/dL (ref 0–200)
HDL: 27 mg/dL — AB (ref >40)
LDL Cholesterol: 74 mg/dL (ref 0–99)
TRIGLYCERIDES: 229 mg/dL — AB (ref <150)
Total CHOL/HDL Ratio: 5.4 RATIO
VLDL: 46 mg/dL — AB (ref 0–40)

## 2014-11-08 LAB — SODIUM: SODIUM: 135 mmol/L (ref 135–145)

## 2014-11-08 MED ORDER — ALUM & MAG HYDROXIDE-SIMETH 200-200-20 MG/5ML PO SUSP: 30.0000 mL | ORAL | Status: DC | PRN

## 2014-11-08 MED ORDER — ATENOLOL 25 MG PO TABS
50.0000 mg | ORAL_TABLET | Freq: Every day | ORAL | Status: DC
  Administered 2014-11-08 – 2014-11-18 (×10): 50 mg via ORAL
  Filled 2014-11-08 (×11): qty 2

## 2014-11-08 MED ORDER — CLOZAPINE 100 MG PO TABS
400.0000 mg | ORAL_TABLET | Freq: Every day | ORAL | Status: DC
  Administered 2014-11-08 – 2014-11-17 (×10): 400 mg via ORAL
  Filled 2014-11-08 (×15): qty 4

## 2014-11-08 MED ORDER — DIVALPROEX SODIUM 500 MG PO DR TAB
1000.0000 mg | DELAYED_RELEASE_TABLET | ORAL | Status: DC
  Administered 2014-11-08 – 2014-11-18 (×10): 1000 mg via ORAL
  Filled 2014-11-08 (×11): qty 2

## 2014-11-08 MED ORDER — SENNA 8.6 MG PO TABS
2.0000 | ORAL_TABLET | Freq: Every day | ORAL | Status: DC
  Administered 2014-11-08 – 2014-11-17 (×10): 17.2 mg via ORAL
  Filled 2014-11-08 (×10): qty 2

## 2014-11-08 MED ORDER — ACETAMINOPHEN 325 MG PO TABS
650.0000 mg | ORAL_TABLET | Freq: Four times a day (QID) | ORAL | Status: DC | PRN
  Administered 2014-11-15: 650 mg via ORAL
  Filled 2014-11-08 (×2): qty 2

## 2014-11-08 MED ORDER — MAGNESIUM HYDROXIDE 400 MG/5ML PO SUSP: 30.0000 mL | Freq: Every day | ORAL | Status: DC | PRN

## 2014-11-08 MED ORDER — DIVALPROEX SODIUM 500 MG PO DR TAB
1500.0000 mg | DELAYED_RELEASE_TABLET | Freq: Every day | ORAL | Status: DC
  Administered 2014-11-08 – 2014-11-17 (×10): 1500 mg via ORAL
  Filled 2014-11-08 (×12): qty 3

## 2014-11-08 MED ORDER — SIMVASTATIN 20 MG PO TABS
20.0000 mg | ORAL_TABLET | Freq: Every day | ORAL | Status: DC
  Administered 2014-11-08 – 2014-11-17 (×10): 20 mg via ORAL
  Filled 2014-11-08 (×10): qty 1

## 2014-11-08 MED ORDER — CLOZAPINE 25 MG PO TABS
100.0000 mg | ORAL_TABLET | Freq: Every day | ORAL | Status: DC
  Administered 2014-11-08 – 2014-11-18 (×11): 100 mg via ORAL
  Filled 2014-11-08: qty 4
  Filled 2014-11-08: qty 1
  Filled 2014-11-08 (×2): qty 4
  Filled 2014-11-08: qty 1
  Filled 2014-11-08 (×5): qty 4
  Filled 2014-11-08: qty 1
  Filled 2014-11-08: qty 4

## 2014-11-08 MED ORDER — NICOTINE 21 MG/24HR TD PT24
21.0000 mg | MEDICATED_PATCH | Freq: Every day | TRANSDERMAL | Status: DC
  Administered 2014-11-11 – 2014-11-13 (×2): 21 mg via TRANSDERMAL
  Filled 2014-11-08 (×9): qty 1

## 2014-11-08 NOTE — Progress Notes (Signed)
Recreation Therapy Notes  At approximately 12:30 pm, LRT attempted assessment. LRT completed Patient Stressors on the assessment. Afterwards, patient requested to sleep. LRT will attempt to complete assessment tomorrow.  Jacquelynn CreeGreene,Raylen Tangonan M, LRT/CTRS 11/08/2014 1:45 PM
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
## 2014-11-08 NOTE — Progress Notes (Signed)
CLOZAPINE MONITORING (reflects NEW REMS GUIDELINES EFFECTIVE 11/01/2013):  Check ANC at least weekly while inpatient.  For general population patients, i.e., those without benign ethnic neutropenia (BEN): --If ANC 1000-1499, increase ANC monitoring to 3x/wk --If ANC < 1000, HOLD CLOZAPINE and get psych consult   For patients with BEN: --If ANC 500-999, increase ANC monitoring to 3x/wk --If ANC < 500, HOLD CLOZAPINE and get psych consult  REMS-certified psychiatry provider can continue drug with ANC below cited thresholds if they document medical opinion that the neutropenia is not clozapine-induced (heme consult is recommended) or that risk of interrupting therapy is greater than the risk of developing severe neutropenia.  --NOTIFY CLINICAL COORDINATOR OF ALL ORDERS FOR CLOZAPINE --SEE PRESCRIBING INFORMATION FOR ADDITIONAL DETAILS   ANC 11/07/14 6,900. Will order weekly ANC d/t patient apparently being restarted on clozapine after unclear history of medication adherence as outpatient.  Fulton ReekMatt Lynise Porr, PharmD, BCPS  11/08/2014
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
## 2014-11-08 NOTE — Progress Notes (Signed)
Recreation Therapy Notes  Date: 10.18.16 Time: 3:00 pm Location: Craft Room  Group Topic: Goal Setting  Goal Area(s) Addresses:  Patient will write one goal. Patient will verbalize benefit of setting goals.  Behavioral Response: Attentive, Disruptive  Intervention: Step By Step  Activity: Patients were given a worksheet with a foot on it. Patients were instructed to write a goal on the inside of the foot and write positive words on the outside of the foot.  Education: LRT educated patients on ways the can keep themselves focused on their goals.  Education Outcome: In group clarification offered  Clinical Observations/Feedback: Patient colored his foot. Patient appeared to be responding to internal stimuli as he was talking to himself the entire group session.  Jacquelynn CreeGreene,Castor Gittleman M, LRT/CTRS 11/08/2014 4:23 PM

## 2014-11-08 NOTE — [facility] Group Notes (Signed)
[facility] LCSW Group Therapy  11/08/2014 3:01 PM  Type of Therapy:  Group Therapy  Participation Level:  Did Not Attend   Jami Bogdanski T, MSW, LCSWA 11/08/2014, 3:01 PM  

## 2014-11-08 NOTE — Plan of Care (Signed)
Problem: Alteration in thought process Goal: LTG-Patient verbalizes understanding importance med regimen (Patient verbalizes understanding of importance of medication regimen and need to continue outpatient care.)  Outcome: Progressing Compliant with medications.     

## 2014-11-08 NOTE — Progress Notes (Signed)
He was irritable with a flat affect.Denies suicidal and homicidal ideation.Visible in the milieu,minimal interaction with peers.Compliant with medications.Did not attend all groups.
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
## 2014-11-08 NOTE — [facility] Group Notes (Signed)
[facility] Group Notes:  (Nursing/MHT/Case Management/Adjunct)  Date:  11/08/2014  Time:  2:04 PM  Type of Therapy:  Psychoeducational Skills  Participation Level:  Did Not Attend  P  Jeffrey [name] Ellison 11/08/2014, 2:04 PM
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
## 2014-11-09 NOTE — [facility] Group Notes (Signed)
[facility]   11/09/2014 1:16 PM  Patient did not attend.  Jenel LucksJasmine Lewis, Clinical Social Work Intern 11/09/14  Beryl MeagerJason Jacara Benito, MSW, Theresia MajorsLCSWA 11/10/14

## 2014-11-09 NOTE — [facility] Group Notes (Signed)
[facility] Group Notes:  (Nursing/MHT/Case Management/Adjunct)  Date:  11/09/2014  Time:  2:53 PM  Type of Therapy:  Psychoeducational Skills  Participation Level:  Did Not Attend   Jeffrey [name] Jeffrey [name] 11/09/2014, 2:53 PM 

## 2014-11-09 NOTE — Plan of Care (Signed)
Problem: Alteration in thought process Goal: LTG-Patient has not harmed self or others in at least 2 days Outcome: Progressing Pt has not harmed self or others in at least 2days. Pt able to verbalize feelings to the staff.

## 2014-11-09 NOTE — Progress Notes (Signed)
Recreation Therapy Notes  At approximately 11:25 am, LRT attempted assessment. LRT completed coping skills section on assessment. Patient refused after that stating he wanted to sleep. LRT will continue to attempt assessment tomorrow.  Jacquelynn CreeGreene,Maeson Lourenco M, LRT/CTRS 11/09/2014 12:06 PM

## 2014-11-09 NOTE — Progress Notes (Signed)
Pt denies any SI/HI/VAH, pt spent all evening in his room, refused to attend groups and refused to come for his meds, HS meds given at bedside and pt complied with it. No concerns voiced, safety maintained.

## 2014-11-09 NOTE — Progress Notes (Signed)
Looks mean and irritable.  Reluctant to talk.  Could not get up for am meds. Patient came to get am meds after eating lunch.  Noted in hall and and all of sudden just yells out Jeffrey [name] then goes down the hall.  No outburst noted.  Safety maintained.

## 2014-11-09 NOTE — Progress Notes (Signed)
Recreation Therapy Notes  Date: 10.19.16 Time: 3:00 pm Location: Craft Room  Group Topic: Self-esteem  Goal Area(s) Addresses:  Patient will identify positive attributes about self. Patient will identify at least one healthy coping skill.  Behavioral Response: Did not attend  Intervention: All About Me  Activity: Patients were instructed to make an All About Me pamphlet listing their life's motto, positive traits, healthy coping skills, and their healthy support system.  Education: LRT educated patient on ways they can increase their self-esteem   Education Outcome: Patient did not attend group.  Clinical Observations/Feedback: Patient did not attend group.  Lyvia Mondesir M, LRT/CTRS 11/09/2014 4:24 PM 

## 2014-11-09 NOTE — Plan of Care (Signed)
Problem: Alteration in mood Goal: LTG-Patient reports reduction in suicidal thoughts (Patient reports reduction in suicidal thoughts and is able to verbalize a safety plan for whenever patient is feeling suicidal)  Outcome: Progressing Denies SI     
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
## 2014-11-10 NOTE — Progress Notes (Signed)
Patient isolates to self and room. Out of room for meals and medications. Minimal interaction with staff and peers. Does not attend group. Will continue to assess and monitor for safety.

## 2014-11-10 NOTE — Progress Notes (Signed)
D:Patient Alert and aware of surroundings but wandering around on unit at times. Minimal interaction with peers and staff. Limited involvement with unit programming. Appetite good. Voiced no concerns.  A: Instructed on medications. Staff encourages patient to come to staff for any concerns.  R: Irritable often.

## 2014-11-10 NOTE — Progress Notes (Signed)
Calm and cooperative. Less irritable today. Pt mother, Lois,called to say that she'll be in to visit with the patient today.   Pt remains guarded. Med compliant.  No aggressive behavior. Denies SI/HI/AV/H. Will continue to monitor for safety and behavior.

## 2014-11-10 NOTE — Progress Notes (Signed)
Recreation Therapy Notes Date: 10.20.16 Time: 3:00 pm Location: Craft Room  Group Topic: Leisure Education  Goal Area(s) Addresses:  Patient will write at least one leisure activity. Patient will write a leisure goal.  Behavioral Response: Intermittently attentive  Intervention: Leisure Bucket List  Activity: Patients were given a leisure bucket list worksheet and instructed to list different leisure activity they had not tried, but wanted to.  Education:LRT educated patients on what is needed to participate in leisure.  Education Outcome: In group clarification offered  Clinical Observations/Feedback: Patient arrived to group at approximately 3:10 pm. Patient left group after he looked to see who was in there. Patient returned to group at approximately 3:15 pm. Patient wrote different words or names instead of leisure activities. Patient did not contribute to group discussion.  Jacquelynn CreeGreene,Tanji Storrs M, LRT/CTRS 11/10/2014 4:33 PM
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
## 2014-11-10 NOTE — Progress Notes (Signed)
Patient ID: Gwyneth RevelsMichael T Dittrich, male   DOB: Aug 16, 1960, 54 y.o.   MRN: [id number]   CSW attempted to complete PSA. He refused to participate. CSW will attempt again tomorrow.   Rondall Allegraandace L Dietrick Barris, MSW, LCSWA  11/10/2014 1:26 PM

## 2014-11-10 NOTE — [facility] Group Notes (Signed)
[facility] LCSW Group Therapy  11/10/2014 1:13 PM  Type of Therapy:  Group Therapy  Participation Level:  Did Not Attend  Modes of Intervention:  Discussion, Education, Socialization and Support  Summary of Progress/Problems: Balance in life: Patients will discuss the concept of balance and how it looks and feels to be unbalanced. Pt will identify areas in their life that is unbalanced and ways to become more balanced.   Lanora Reveron L Montserrat Shek MSW, LCSWA  11/10/2014, 1:13 PM 

## 2014-11-10 NOTE — Plan of Care (Signed)
Problem: Alteration in mood Goal: STG-Patient reports thoughts of self-harm to staff Outcome: Progressing No reports of thoughts to self-harm.

## 2014-11-10 NOTE — Plan of Care (Signed)
Problem: Alteration in mood Goal: LTG-Patient reports reduction in suicidal thoughts (Patient reports reduction in suicidal thoughts and is able to verbalize a safety plan for whenever patient is feeling suicidal)  Outcome: Not Met (add Reason) Calm and cooperative. No injuries noted. No voiced thoughts of hurting himself. q  15 min checks maintained for safety.

## 2014-11-10 NOTE — [facility] Group Notes (Signed)
[facility] Group Notes:  (Nursing/MHT/Case Management/Adjunct)  Date:  11/10/2014  Time:  3:50 PM  Type of Therapy:  Psychoeducational Skills  Participation Level:  Did Not Attend   Lynelle SmokeCara Travis [facility] 11/10/2014, 3:50 PM
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
## 2014-11-10 NOTE — Plan of Care (Signed)
Problem: Alteration in thought process Goal: STG-Patient is able to follow short directions Outcome: Progressing Able to follow commands Goal: STG-Patient is able to discuss thoughts with staff Outcome: Not Progressing Patient does not converse with staff  Problem: Alteration in mood Goal: STG-Patient is able to discuss feelings and issues (Patient is able to discuss feelings and issues leading to depression)  Outcome: Not Progressing Not discussing feelings or issues with staff

## 2014-11-10 NOTE — Progress Notes (Addendum)
RN asked pt to come to medication room for medications. Pt agreed, but did not get out of bed and turned away from RN. RN will reattempt to get pt to come to med room. Update: patient came to med room and took all medications.

## 2014-11-10 NOTE — Progress Notes (Signed)
Recreation Therapy Notes  At approximately 1:15 pm, LRT spoke with patient's nurse regarding patient's assessment. Patient's nurse reported patient is not talking today.  Jacquelynn CreeGreene,Abrey Bradway M, LRT/CTRS 11/10/2014 1:52 PM
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
## 2014-11-11 LAB — CLOZAPINE (CLOZARIL)
CLOZAPINE LVL: 496 ng/mL (ref 350–650)
NORCLOZAPINE: 112 ng/mL
Total(Cloz+Norcloz): 608 ng/mL

## 2014-11-11 NOTE — Progress Notes (Signed)
Recreation Therapy Notes  Date: 10.21.16 Time: 3:00 pm Location: Craft Room  Group Topic: Coping Skills  Goal Area(s) Addresses:  Patient will participate in coloring activity. Patient will verbalize benefit of art as a coping skills  Behavioral Response: Did not attend  Intervention: Coloring  Activity: Patients were given different coloring sheets and instructed to think about what emotions they were feeling and what they were focused on while they were coloring.  Education: LRT educated patients on healthy coping skills and why they are important.  Education Outcome: Patient did not attend group.  Clinical Observations/Feedback: Patient did not attend group.  Jacquelynn CreeGreene,Ethal Gotay M, LRT/CTRS 11/11/2014 4:15 PM

## 2014-11-11 NOTE — [facility] Group Notes (Signed)
[facility] LCSW Group Therapy  11/11/2014 2:43 PM  Patient did not attend group.  Jenel LucksJasmine Lewis, Clinical Social Work Intern 11/11/2014, 2:43 PM

## 2014-11-11 NOTE — [facility] Group Notes (Signed)
[facility] Group Notes:  (Nursing/MHT/Case Management/Adjunct)  Date:  11/11/2014  Time:  5:52 AM  Type of Therapy:  Group Therapy  Participation Level:  Active  Participation Quality:  Appropriate and Attentive  Affect:  Appropriate  Cognitive:  Alert and Appropriate  Insight:  Good  Engagement in Group:  Engaged  Modes of Intervention:  Discussion  Summary of Progress/Problems:  Tyona Nilsen Joy Nicha Hemann 11/11/2014, 5:52 AM

## 2014-11-11 NOTE — Progress Notes (Signed)
Patient has been resting quietly in his room, no distress, breathing regular and unlabored.  

## 2014-11-11 NOTE — [facility] Group Notes (Signed)
[facility] Group Notes:  (Nursing/MHT/Case Management/Adjunct)  Date:  11/11/2014  Time:  2:03 PM  Type of Therapy:  Psychoeducational Skills  Participation Level:  Minimal  Participation Quality:  Resistant  Affect:  Resistant  Cognitive:  Lacking  Insight:  Lacking  Engagement in Group:  Poor  Modes of Intervention:  Activity, Socialization and Support  Summary of Progress/Problems:  Lynelle SmokeCara Travis Donnielle Addison 11/11/2014, 2:03 PM
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
## 2014-11-11 NOTE — Plan of Care (Signed)
Problem: Alteration in mood Goal: LTG-Patient reports reduction in suicidal thoughts (Patient reports reduction in suicidal thoughts and is able to verbalize a safety plan for whenever patient is feeling suicidal)  Outcome: Progressing Patient denies any SI at this time and states that depression is getting better

## 2014-11-11 NOTE — Plan of Care (Signed)
Problem: Alteration in thought process Goal: LTG-Patient has not harmed self or others in at least 2 days Outcome: Progressing Patient has not harmed self or others today.  Goal: LTG-Patient behavior demonstrates decreased signs psychosis (Patient behavior demonstrates decreased signs of psychosis to the point the patient is safe to return home and continue treatment in an outpatient setting.)  Outcome: Progressing Pt cooperative and interactive in the milieu.  Goal: LTG-Patient verbalizes understanding importance med regimen (Patient verbalizes understanding of importance of medication regimen and need to continue outpatient care.)  Outcome: Progressing Patient reports understanding of medication importance. Medication compliant.  Goal: STG-Patient is able to follow short directions Outcome: Progressing Patient able to follow directions and pleasant with requests, although restless and impatient.

## 2014-11-11 NOTE — Progress Notes (Signed)
D: patient remained in room for most of the shift.  Patient denies any SI or HI at this time.  Patient did not attend groups.  Patient still very depressed and angry.  Patient compliant with medications.   A: support and encouragement provided.  Medications given as prescribed.   R: patient receptive of information given

## 2014-11-12 MED ORDER — CLONAZEPAM 0.5 MG PO TABS
0.5000 mg | ORAL_TABLET | Freq: Two times a day (BID) | ORAL | Status: DC
  Administered 2014-11-12 – 2014-11-18 (×12): 0.5 mg via ORAL
  Filled 2014-11-12 (×12): qty 1

## 2014-11-12 NOTE — [facility] Group Notes (Signed)
[facility] LCSW Group Therapy  11/12/2014 2:18 PM  Type of Therapy:  Group Therapy  Participation Level:  Minimal  Participation Quality:  Inattentive   Affect:  Flat  Cognitive:  Disorganized   Insight:  Limited  Engagement in Therapy:  Limited  Modes of Intervention:  Discussion, Education, Socialization and Support  Summary of Progress/Problems: Pt will identify unhealthy thoughts and how they impact their emotions and behavior. Pt will be encouraged to discuss these thoughts, emotions and behaviors with the group. Pt attended most of group. He had difficulties staying on topic. 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 He then asked if he could be let into the day room to watch game.   Sempra EnergyCandace L Elfa Wooton MSW, LCSWA  11/12/2014, 2:18 PM

## 2014-11-12 NOTE — [facility] Group Notes (Signed)
[facility] Group Notes:  (Nursing/MHT/Case Management/Adjunct)  Date:  11/12/2014  Time:  1:32 AM  Type of Therapy:  Group Therapy  Participation Level:  Minimal  Participation Quality:  Inattentive and Resistant  Affect:  Flat and Resistant  Cognitive:  Appropriate  Insight:  None  Engagement in Group:  Limited and Resistant  Modes of Intervention:  Socialization and Support  Summary of Progress/Problems:  Jeffrey ReddenOlivia Briana [name] 11/12/2014, 1:32 AM

## 2014-11-12 NOTE — Progress Notes (Signed)
D: patient observed in his room for most of the shift.  Patient denies any SI at this time. Patient irritable at times.  Patient compliant with medications.  Patient very short with staff.   Patient in no distress at this time.   A: support and encouragement provided.  Medications given as prescribed.  q 15 min checks done  R: patient receptive of information given

## 2014-11-12 NOTE — Plan of Care (Signed)
Problem: Alteration in mood Goal: LTG-Patient reports reduction in suicidal thoughts (Patient reports reduction in suicidal thoughts and is able to verbalize a safety plan for whenever patient is feeling suicidal)  Outcome: Progressing Patient has stated a decrease in negative feelings and depression.  Patient denies any SI or HI at this time.

## 2014-11-12 NOTE — [facility] Group Notes (Signed)
[facility] Group Notes:  (Nursing/MHT/Case Management/Adjunct)  Date:  11/12/2014  Time:  9:00 AM  Type of Therapy:  Goals   Participation Level:  Did Not Attend  Philomene Haff De'Chelle Ada Woodbury 11/12/2014, 9:00 AM 
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
## 2014-11-13 LAB — CBC WITH DIFFERENTIAL/PLATELET
BASOS ABS: 0 10*3/uL (ref 0–0.1)
BASOS PCT: 1 %
EOS ABS: 0 10*3/uL (ref 0–0.7)
EOS PCT: 0 %
HCT: 44 % (ref 40.0–52.0)
HEMOGLOBIN: 15.4 g/dL (ref 13.0–18.0)
LYMPHS ABS: 2.7 10*3/uL (ref 1.0–3.6)
Lymphocytes Relative: 42 %
MCH: 31 pg (ref 26.0–34.0)
MCHC: 34.9 g/dL (ref 32.0–36.0)
MCV: 88.6 fL (ref 80.0–100.0)
Monocytes Absolute: 0.8 10*3/uL (ref 0.2–1.0)
Monocytes Relative: 13 %
NEUTROS PCT: 44 %
Neutro Abs: 2.8 10*3/uL (ref 1.4–6.5)
PLATELETS: 201 10*3/uL (ref 150–440)
RBC: 4.97 MIL/uL (ref 4.40–5.90)
RDW: 13.7 % (ref 11.5–14.5)
WBC: 6.3 10*3/uL (ref 3.8–10.6)

## 2014-11-13 NOTE — Progress Notes (Signed)
Pt has been pleasant and cooperative.. Pt's mood and affect has been depressed. Pt denies SI and A/V hallucinations. Pt denies any discomfort at present. Pt has been seclusive to his room . Pt has not attended any unit activties .

## 2014-11-13 NOTE — Plan of Care (Signed)
Problem: Alteration in thought process Goal: LTG-Patient has not harmed self or others in at least 2 days Outcome: Progressing No harm to self or others today.  Goal: LTG-Patient behavior demonstrates decreased signs psychosis (Patient behavior demonstrates decreased signs of psychosis to the point the patient is safe to return home and continue treatment in an outpatient setting.)  Outcome: Progressing Patient appropriately interactive with his peers in the milieu. Oriented x 4.  Goal: LTG-Patient verbalizes understanding importance med regimen (Patient verbalizes understanding of importance of medication regimen and need to continue outpatient care.)  Outcome: Progressing Patient able to reports understanding of medication importance, along with appropriate dosages.

## 2014-11-13 NOTE — Progress Notes (Signed)
CLOZAPINE MONITORING (reflects NEW REMS GUIDELINES EFFECTIVE 11/01/2013):  Check ANC at least weekly while inpatient.  For general population patients, i.e., those without benign ethnic neutropenia (BEN): --If ANC 1000-1499, increase ANC monitoring to 3x/wk --If ANC < 1000, HOLD CLOZAPINE and get psych consult   For patients with BEN: --If ANC 500-999, increase ANC monitoring to 3x/wk --If ANC < 500, HOLD CLOZAPINE and get psych consult  REMS-certified psychiatry provider can continue drug with ANC below cited thresholds if they document medical opinion that the neutropenia is not clozapine-induced (heme consult is recommended) or that risk of interrupting therapy is greater than the risk of developing severe neutropenia.  --NOTIFY CLINICAL COORDINATOR OF ALL ORDERS FOR CLOZAPINE --SEE PRESCRIBING INFORMATION FOR ADDITIONAL DETAILS   ANC 11/07/14 6,900. Will order weekly ANC d/t patient apparently being restarted on clozapine after unclear history of medication adherence as outpatient.  10/23 0800 ANC 2800. Labs sent to Clozapine registry. Next Clozapine labs ordered for 10/30.   Demetrius Charityeldrin D. Jasiri Hanawalt, PharmD  11/13/2014

## 2014-11-13 NOTE — [facility] Group Notes (Signed)
[facility] Group Notes:  (Nursing/MHT/Case Management/Adjunct)  Date:  11/13/2014  Time:  8:54 AM  Type of Therapy:  Goals   Participation Level:  Did Not Attend  Jama Mcmiller De'Chelle Terrace Fontanilla 11/13/2014, 8:54 AM

## 2014-11-13 NOTE — Plan of Care (Signed)
Problem: Alteration in thought process Goal: LTG-Patient has not harmed self or others in at least 2 days Outcome: Progressing No self harm.  Goal: LTG-Patient behavior demonstrates decreased signs psychosis (Patient behavior demonstrates decreased signs of psychosis to the point the patient is safe to return home and continue treatment in an outpatient setting.)  Outcome: Progressing Patient pleasant and appropriately interactive in the milieu. Med compliant.
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
## 2014-11-13 NOTE — Progress Notes (Signed)
Patient pleasant, cooperative, and interactive with other patients in the milieu. During medication time he noted concern over the amount of pills he had to take (he has to take 12 pills for one medication because it is given in 25mg  tabs for a total of 400mg ). He noted relief when he realized the 12 pills were not different medications or new prescriptions. He denies SI, HI, and AVH. No other needs or distress noted.

## 2014-11-14 NOTE — [facility] Group Notes (Signed)
[facility] Group Notes:  (Nursing/MHT/Case Management/Adjunct)  Date:  11/14/2014  Time:  12:53 PM  Type of Therapy:  Psychoeducational Skills  Participation Level:  Did Not Attend   Hassie Mandt S Chauncy Mangiaracina 11/14/2014, 12:53 PM 

## 2014-11-14 NOTE — Plan of Care (Signed)
Problem: Alteration in mood Goal: STG-Patient reports thoughts of self-harm to staff Outcome: Not Applicable Date Met:  56/43/32 No injuries noted. No voiced thoughts of hurting himself. q 15 min checks maintained for safety. No c/o pain/discomfort noted.

## 2014-11-14 NOTE — Progress Notes (Signed)
He stayed in room most of the time.Denies depression & suicidal ideation.Did not attend groups.Stated that he had a lazy day today.Compliant with medications.Appetite good.

## 2014-11-14 NOTE — Plan of Care (Signed)
Problem: Alteration in mood Goal: LTG-Patient reports reduction in suicidal thoughts (Patient reports reduction in suicidal thoughts and is able to verbalize a safety plan for whenever patient is feeling suicidal)  Outcome: Progressing Denies depression & suicidal ideation.
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
## 2014-11-14 NOTE — Progress Notes (Signed)
Patient is pleasant and cooperative. No needs or distress noted. Denies SI, HI, AVH. Appropriately interactive in the milieu. Medication compliant.

## 2014-11-14 NOTE — Progress Notes (Signed)
Recreation Therapy Notes  Date: 10.24.16 Time: 3:00 pm Location: Craft Room  Group Topic: Self-expression  Goal Area(s) Addresses:  Patient will effectively use art as a mean's of self-expression. Patient will recognize positive benefit of self-expression. Patient will be able to identify one emotion experienced during group session. Patient will identify use of art/self-expression as a coping skill.  Behavioral Response: Attentive, Left early  Intervention: Two Faces of Me  Activity: Patients were given a blank face worksheet and instructed to draw or write how they felt when they were admitted to the hospital on one side and draw or write how they want to feel when they are discharged from the hospital on the other side.  Education: LRT educated patients on other forms of self-expression.  Education Outcome: Patient left before LRT educated patients.  Clinical Observations/Feedback: Patient drew a portrait of a person. Patient did not contribute to group discussion. Patient left group at approximately 3:28 pm. Patient did not return to group.  Jacquelynn CreeGreene,Yoshie Kosel M, LRT/CTRS 11/14/2014 4:55 PM

## 2014-11-15 NOTE — Progress Notes (Signed)
Calm and cooperative. Remains paranoid and guarded.  Med compliant. OOB to DR watching tv. Limited interaction with peers and staff. No c/o pain/discomfort noted. Slept 6.15 hours.

## 2014-11-15 NOTE — Progress Notes (Signed)
Patient alert oriented x3, medication compliant during the shift. Patient denies any SI/HI/AVH during the shift. Patient isolated during the early part of the shift, but later took a shower and came out into the milieu with his peers.

## 2014-11-15 NOTE — Progress Notes (Signed)
Recreation Therapy Notes  Date: 10.25.16 Time: 3:00 pm Location: Craft Room  Group Topic: Goal Setting  Goal Area(s) Addresses:  Patient will write at least one goal. Patient will write at least one obstacle.  Behavioral Response: Arrived late, Attentive  Intervention: Recovery Goal Chart  Activity: Patients were instructed to make a goal chart including goals towards their recovery, obstacles, the date they started working on their goal, and the date they achieved their goal.  Education:LRT educated patients on healthy ways they can celebrate reaching their goals.  Education Outcome: In group clarification offered   Clinical Observations/Feedback: Patient arrived to group at approximately 3:35 pm. Patient did not contribute to group discussion.  Jacquelynn CreeGreene,Kassy Mcenroe M, LRT/CTRS 11/15/2014 4:36 PM
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
## 2014-11-15 NOTE — Plan of Care (Signed)
Problem: Alteration in thought process Goal: LTG-Patient has not harmed self or others in at least 2 days Outcome: Progressing Patient did not harm himself during the shift. Goal: LTG-Patient behavior demonstrates decreased signs psychosis (Patient behavior demonstrates decreased signs of psychosis to the point the patient is safe to return home and continue treatment in an outpatient setting.)  Outcome: Progressing Patient denies any AVH during the shift.

## 2014-11-16 NOTE — Progress Notes (Signed)
D: Patient denies SI/HI/AVH. Patient's affect is flat and sad, mood is tense but, he cooperates with care. Patient appears less anxious and he is interacting with peers and staff appropriately.  A: Pt was offered support and encouragement. Pt was given scheduled medications. Pt was encouraged to attend groups. Q 15 minute checks were done for safety.  R:Pt attends groups and interacts appropriately with peers and staff. Pt is compliant with medication,  no complaints.Pt receptive to treatment and safety maintained on unit.

## 2014-11-16 NOTE — [facility] Group Notes (Signed)
[facility]   11/16/2014 11:25 AM  Patient did not attend.  Jenel LucksJasmine Lewis, Clinical Social Work Intern 11/16/14  Beryl MeagerJason Emaan Gary, MSW,  Theresia MajorsLCSWA  11/16/14

## 2014-11-16 NOTE — [facility] Group Notes (Signed)
[facility] Group Notes:  (Nursing/MHT/Case Management/Adjunct)  Date:  11/16/2014  Time:  2:08 PM  Type of Therapy:  Psychoeducational Skills  Participation Level:  Did Not Attend   Lynelle SmokeCara Travis [facility] Medical CenterMadoni 11/16/2014, 2:08 PM

## 2014-11-16 NOTE — Progress Notes (Signed)
Recreation Therapy Notes  Date: 10.26.16 Time: 3:00 pm Location: Craft Room  Group Topic: Self-esteem  Goal Area(s) Addresses:  Patient will write at least one positive trait. Patient will verbalize benefit of having a healthy self-esteem.  Behavioral Response: Did not attend  Intervention: I Am  Activity: Patients were given a worksheet with the letter I on it and instructed to fill the letter with as many positive traits about themselves as they could.  Education:LRT educated patients on ways they can increase their self-esteem.   Education Outcome: Patient did not attend group.   Clinical Observations/Feedback: Patient did not attend group.  Byren Pankow M, LRT/CTRS 11/16/2014 4:33 PM 

## 2014-11-16 NOTE — [facility] Group Notes (Signed)
[facility] LCS[facility] Group Therapy  11/16/2014 2:40 PM  Type of Therapy:  Group Therapy  Participation Level:  Did Not Attend   Lulu Ridingngle, Abdalrahman Clementson T, MSW, LCSWA 11/16/2014, 2:40 PM

## 2014-11-16 NOTE — Progress Notes (Signed)
Patient alert oriented x3, denies SI/HI/AVH during the shift. Patient was calm, cooperative and medication compliant during the shift. Patient slept most of the morning and got up in the afternoon. Patient interacted appropriately with peers and staff in the milieu.
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
## 2014-11-16 NOTE — Plan of Care (Signed)
Problem: Alteration in mood Goal: LTG-Patient reports reduction in suicidal thoughts (Patient reports reduction in suicidal thoughts and is able to verbalize a safety plan for whenever patient is feeling suicidal)  Outcome: Progressing Patient denies SI/HI no distress noted.
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
## 2014-11-16 NOTE — Progress Notes (Signed)
Calm and cooperative. Flat affect. Med compliant. No inappropriate behavior noted. Denies SI/HI/AV/H noted. Slept 7 hours.

## 2014-11-16 NOTE — Plan of Care (Signed)
Problem: Alteration in thought process Goal: LTG-Patient has not harmed self or others in at least 2 days Outcome: Progressing Patient has not harmed himself or others in last two days.  Goal: LTG-Patient behavior demonstrates decreased signs psychosis (Patient behavior demonstrates decreased signs of psychosis to the point the patient is safe to return home and continue treatment in an outpatient setting.)  Outcome: Progressing Patient displayed appropriate behaviors and denies any auditory, visual, hallucinations.

## 2014-11-16 NOTE — [facility] Group Notes (Signed)
[facility] Group Notes:  (Nursing/MHT/Case Management/Adjunct)  Date:  11/16/2014  Time:  10:20 PM  Type of Therapy:  Group Therapy  Participation Level:  Active  Participation Quality:  Appropriate  Affect:  Appropriate  Cognitive:  Appropriate  Insight:  Appropriate  Engagement in Group:  Engaged  Modes of Intervention:  Discussion  Summary of Progress/Problems:  Burt EkJanice Marie Roran [name] 11/16/2014, 10:20 PM

## 2014-11-16 NOTE — Progress Notes (Signed)
Pharmacy Consult:  CLOZAPINE MONITORING (reflects NEW REMS GUIDELINES EFFECTIVE 11/01/2013):  Check ANC at least weekly while inpatient.  For general population patients, i.e., those without benign ethnic neutropenia (BEN): --If ANC 1000-1499, increase ANC monitoring to 3x/wk --If ANC < 1000, HOLD CLOZAPINE and get psych consult   For patients with BEN: --If ANC 500-999, increase ANC monitoring to 3x/wk --If ANC < 500, HOLD CLOZAPINE and get psych consult  REMS-certified psychiatry provider can continue drug with ANC below cited thresholds if they document medical opinion that the neutropenia is not clozapine-induced (heme consult is recommended) or that risk of interrupting therapy is greater than the risk of developing severe neutropenia.  --NOTIFY CLINICAL COORDINATOR OF ALL ORDERS FOR CLOZAPINE --SEE PRESCRIBING INFORMATION FOR ADDITIONAL DETAILS   ANC 11/07/14 6,900. Will order weekly ANC d/t patient apparently being restarted on clozapine after unclear history of medication adherence as outpatient.  10/23 0800 ANC 2800. Labs sent to Clozapine registry. Next Clozapine labs ordered for 10/30.    Bari MantisKristin Melbourne Jakubiak PharmD Clinical Pharmacist 11/16/2014
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
## 2014-11-17 NOTE — Progress Notes (Signed)
Patient refused 0700 depakote, oncoming nurse informed of patient's behavior.

## 2014-11-17 NOTE — Progress Notes (Signed)
D:Patient continue to have profuse drooling on self and bed. Appetite good . Voice of discharge  Tomorrow  Returning home to mother. Limited interaction with peers and staff. No unit programing . No ADL's today . Stated he took a shower yesterday. Compliant   With medications.   No auditory hallucinations  No pain concerns .  A: Encourage patient participation with unit programming . Instruction  Given on  Medication , verbalize understanding. R: Voice no other concerns. Staff continue to monitor
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
## 2014-11-17 NOTE — Progress Notes (Deleted)
Recreation Therapy Notes  Date: 10.27.16 Time: 3:15 pm Location: Craft Room  Group Topic: Leisure Education  Goal Area(s) Addresses:  Patient will identify activities for each letter of the alphabet. Patient will verbalize ability to integrate positive leisure into life post d/c. Patient will verbalize ability to use leisure as a Associate Professorcoping skill.  Behavioral Response: Attentive, Interactive, Disruptive, Left early  Intervention: Leisure Alphabet  Activity: Patients were given a Leisure Information systems managerAlphabet worksheet and instructed to think of healthy leisure activities for each letter of the alphabet.  Education: LRT educated patients on what they need to participate in leisure.  Education Outcome: Acknowledges education/In group clarification offered  Clinical Observations/Feedback: Patient completed activity by writing healthy leisure activities down. Patient contributed to group discussion by stating some of the healthy leisure activities he wrote down. During group discussion, patient wanted to go back to letters he was not able to fill in after LRT had been through the alphabet. LRT explained she would helped him when group was over. Patient continued to asked for leisure activities for certain letters. Patient left group at approximately 3:45 pm. Patient did not return to group.  Jacquelynn CreeGreene,Matilynn Dacey M, LRT/CTRS 11/17/2014 4:30 PM

## 2014-11-17 NOTE — [facility] Group Notes (Signed)
[facility] Group Notes:  (Nursing/MHT/Case Management/Adjunct)  Date:  11/17/2014  Time:  2:09 PM  Type of Therapy:  Psychoeducational Skills  Participation Level:  Did Not Attend   Lynelle SmokeCara Travis [facility] CenterMadoni 11/17/2014, 2:09 PM

## 2014-11-17 NOTE — Progress Notes (Signed)
Recreation Therapy Notes  Date: 10.27.16 Time: 3:15 pm Location: Craft Room  Group Topic: Leisure Education  Goal Area(s) Addresses:  Patient will identify activities for each letter of the alphabet. Patient will verbalize ability to integrate positive leisure into life post d/c. Patient will verbalize ability to use leisure as a Associate Professorcoping skill.  Behavioral Response: Attentive, Interactive, Disruptive, Left early  Intervention: Leisure Alphabet  Activity: Patients were given a Leisure Information systems managerAlphabet worksheet and instructed to think of healthy leisure activities for each letter of the alphabet.  Education:LRT educated patients on what they need to participate in leisure.  Education Outcome: In group clarification offered  Clinical Observations/Feedback: Patient completed activity by writing healthy leisure activities down. Patient contributed to group discussion by stating some of the healthy leisure activities he wrote down. During group discussion, patient wanted to go back to letters he was not able to fill in after LRT had been through the alphabet. LRT explained she would helped him when group was over. Patient continued to asked for leisure activities for certain letters. Patient left group at approximately 3:45 pm. Patient did not return to group.  Jacquelynn CreeGreene,Other Atienza M, LRT/CTRS 11/17/2014 4:37 PM
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
## 2014-11-18 NOTE — Progress Notes (Signed)
D: Pt denies SI/HI/AVH. Pt is irritable at times but compliant with medication this evening, patient appears less anxious and he is interacting with peers and staff appropriately.  A: Pt was offered support and encouragement. Pt was given scheduled medications. Pt was encouraged to attend groups. Q 15 minute checks were done.  Patient is scheduled for discharge tomorrow.  R:Pt attends groups and interacts well with peers and staff. Pt is taking medication. Pt has no complaints.Pt receptive to treatment and safety maintained on unit.
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
## 2014-11-18 NOTE — Progress Notes (Signed)
Patient denies SI/HI, denies A/V hallucinations. Patient verbalizes understanding of discharge instructions, follow up care. Patient given all belongings from  locker. Patient escorted out by staff, transported by family. 

## 2014-11-18 NOTE — Plan of Care (Signed)
Problem: Alteration in mood Goal: LTG-Patient reports reduction in suicidal thoughts (Patient reports reduction in suicidal thoughts and is able to verbalize a safety plan for whenever patient is feeling suicidal)  Outcome: Progressing Patient denies SI/HI.      

## 2014-11-18 NOTE — [facility] Suicide Risk Assessment (Signed)
[facility] INPATIENT:  Family/Significant Other Suicide Prevention Education  Suicide Prevention Education:  Education Completed; Leroy KennedyLois Membreno [phone], has been identified by the patient as the family member/significant other with whom the patient will be residing, and identified as the person(s) who will aid the patient in the event of a mental health crisis (suicidal ideations/suicide attempt).  With written consent from the patient, the family member/significant other has been provided the following suicide prevention education, prior to the and/or following the discharge of the patient.  The suicide prevention education provided includes the following:  Suicide risk factors  Suicide prevention and interventions  National Suicide Hotline telephone number  [facility] assessment telephone number  [facility] Emergency Assistance 911  [facility] and/or Residential Mobile Crisis Unit telephone number  Request made of family/significant other to:  Remove weapons (e.g., guns, rifles, knives), all items previously/currently identified as safety concern.    Remove drugs/medications (over-the-counter, prescriptions, illicit drugs), all items previously/currently identified as a safety concern.  The family member/significant other verbalizes understanding of the suicide prevention education information provided.  The family member/significant other agrees to remove the items of safety concern listed above.  Jeremian Whitby L Tayvon Culley MSW, LCSWA  11/18/2014, 9:21 AM

## 2014-11-18 NOTE — [facility] Suicide Risk Assessment (Signed)
[facility]   Demographic Factors:  Male and Caucasian  Total Time spent with patient: 30 minutes  Musculoskeletal: Strength & Muscle Tone: within normal limits Gait & Station: normal Patient leans: N/A  Psychiatric Specialty Exam: Physical Exam  Nursing note and vitals reviewed.   Review of Systems  Psychiatric/Behavioral: Positive for hallucinations.  All other systems reviewed and are negative.   Blood pressure 110/72, pulse 83, temperature 96.8 F (36 C), temperature source Oral, resp. rate 20, height 5\' 9"  (1.753 m), weight 90.7 kg (199 lb 15.3 oz), SpO2 94 %.Body mass index is 29.52 kg/(m^2).  General Appearance: Casual  Eye Contact::  Good  Speech:  Clear and Coherent409  Volume:  Normal  Mood:  Euthymic  Affect:  Appropriate  Thought Process:  Goal Directed  Orientation:  Full (Time, Place, and Person)  Thought Content:  WDL  Suicidal Thoughts:  No  Homicidal Thoughts:  No  Memory:  Immediate;   Fair Recent;   Fair Remote;   Fair  Judgement:  Impaired  Insight:  Shallow  Psychomotor Activity:  Normal  Concentration:  Fair  Recall:  [employer]Fair  Fund of Knowledge:Fair  Language: Fair  Akathisia:  No  Handed:  Right  AIMS (if indicated):     Assets:  Communication Skills Desire for Improvement Financial Resources/Insurance Housing Physical Health Resilience Social Support  Sleep:  Number of Hours: 7  Cognition: WNL  ADL's:  Intact   Have you used any form of tobacco in the last 30 days? (Cigarettes, Smokeless Tobacco, Cigars, and/or Pipes): Yes  Has this patient used any form of tobacco in the last 30 days? (Cigarettes, Smokeless Tobacco, Cigars, and/or Pipes) Yes, A prescription for an FDA-approved tobacco cessation medication was offered at discharge and the patient refused  Mental Status Per Nursing Assessment::   On Admission:     Current Mental Status by Physician: NA  Loss Factors: NA  Historical Factors: Prior  suicide attempts and Impulsivity  Risk Reduction Factors:   Sense of responsibility to family, Living with another person, especially a relative, Positive social support and Positive therapeutic relationship  Continued Clinical Symptoms:  Schizophrenia:   Depressive state  Cognitive Features That Contribute To Risk:  None    Suicide Risk:  Minimal: No identifiable suicidal ideation.  Patients presenting with no risk factors but with morbid ruminations; may be classified as minimal risk based on the severity of the depressive symptoms  Principal Problem: Schizophrenia [facility](HCC) Discharge Diagnoses:  Patient Active Problem List   Diagnosis Date Noted  . 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 Schizophrenia (HCC) [F20.9] 11/08/2014  . Tobacco use disorder [F17.200] 06/30/2014  . 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 Hypertension [I10] 06/29/2014  . Hyponatremia [E87.1] 06/29/2014  . Laceration of neck [S11.91XA] 06/29/2014  . Suicidal behavior [F48.9] 06/29/2014      Plan Of Care/Follow-up recommendations:  Activity:  as tolerated. Diet:  low sodium heart healthy. Other:  keep follow up appointments.  Is patient on multiple antipsychotic therapies at discharge:  No   Has Patient had three or more failed trials of antipsychotic monotherapy by history:  No  Recommended Plan for Multiple Antipsychotic Therapies: NA    Amoni Scallan 11/18/2014, 8:41 AM

## 2014-11-18 NOTE — Progress Notes (Signed)
  [facility] :  Will you be returning to the same living situation after discharge:  Yes,  Home At discharge, do you have transportation home?: Yes,  Mother Do you have the ability to pay for your medications: Yes,  Insurance   Release of information consent forms completed and in the chart;  Patient's signature needed at discharge.  Patient to Follow up at: Follow-up Information    Follow up with [facility]  On 11/21/2014.   Why:  Your ACT team will come see you by Monday,Oct. 31st.    Contact information:   [address] [place], [place] [id number] Phone: [phone] Fax: [phone]      Patient denies SI/HI: Yes,  Yes     Safety Planning and Suicide Prevention discussed: Yes,  With patient and mother   Have you used any form of tobacco in the last 30 days? (Cigarettes, Smokeless Tobacco, Cigars, and/or Pipes): Yes  Has patient been referred to the Quitline?: Patient refused referral  Sempra EnergyCandace L Fariha [name] MSW, LCSWA  11/18/2014, 9:22 AM
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
## 2016-02-15 ENCOUNTER — Encounter: Payer: Self-pay | Admitting: Emergency Medicine

## 2016-02-15 ENCOUNTER — Emergency Department
Admission: EM | Admit: 2016-02-15 | Discharge: 2016-02-15 | Disposition: A | Discharge status: Home / Self Care | Payer: Medicare Other | Attending: Emergency Medicine | Admitting: Emergency Medicine

## 2016-02-15 DIAGNOSIS — F201 Disorganized schizophrenia: Secondary | ICD-10-CM | POA: Diagnosis not present

## 2016-02-15 DIAGNOSIS — I10 Essential (primary) hypertension: Secondary | ICD-10-CM | POA: Insufficient documentation

## 2016-02-15 DIAGNOSIS — F918 Other conduct disorders: Secondary | ICD-10-CM | POA: Diagnosis present

## 2016-02-15 DIAGNOSIS — Z9119 Patient's noncompliance with other medical treatment and regimen: Secondary | ICD-10-CM

## 2016-02-15 DIAGNOSIS — Z79899 Other long term (current) drug therapy: Secondary | ICD-10-CM | POA: Insufficient documentation

## 2016-02-15 DIAGNOSIS — F209 Schizophrenia, unspecified: Secondary | ICD-10-CM | POA: Insufficient documentation

## 2016-02-15 DIAGNOSIS — F17210 Nicotine dependence, cigarettes, uncomplicated: Secondary | ICD-10-CM | POA: Diagnosis not present

## 2016-02-15 DIAGNOSIS — Z91199 Patient's noncompliance with other medical treatment and regimen due to unspecified reason: Secondary | ICD-10-CM

## 2016-02-15 DIAGNOSIS — R4689 Other symptoms and signs involving appearance and behavior: Secondary | ICD-10-CM

## 2016-02-15 LAB — URINE DRUG SCREEN, QUALITATIVE (ARMC ONLY)
AMPHETAMINES, UR SCREEN: NOT DETECTED
Barbiturates, Ur Screen: NOT DETECTED
Benzodiazepine, Ur Scrn: NOT DETECTED
COCAINE METABOLITE, UR ~~LOC~~: NOT DETECTED
Cannabinoid 50 Ng, Ur ~~LOC~~: NOT DETECTED
MDMA (ECSTASY) UR SCREEN: NOT DETECTED
METHADONE SCREEN, URINE: NOT DETECTED
Opiate, Ur Screen: NOT DETECTED
Phencyclidine (PCP) Ur S: NOT DETECTED
TRICYCLIC, UR SCREEN: NOT DETECTED

## 2016-02-15 LAB — COMPREHENSIVE METABOLIC PANEL
ALT: 14 U/L — AB (ref 17–63)
AST: 16 U/L (ref 15–41)
Albumin: 4.2 g/dL (ref 3.5–5.0)
Alkaline Phosphatase: 72 U/L (ref 38–126)
Anion gap: 10 (ref 5–15)
BUN: 11 mg/dL (ref 6–20)
CHLORIDE: 103 mmol/L (ref 101–111)
CO2: 21 mmol/L — AB (ref 22–32)
CREATININE: 0.76 mg/dL (ref 0.61–1.24)
Calcium: 9.2 mg/dL (ref 8.9–10.3)
GFR calc non Af Amer: >60 mL/min (ref >60)
Glucose, Bld: 132 mg/dL — ABNORMAL HIGH (ref 65–99)
POTASSIUM: 3.7 mmol/L (ref 3.5–5.1)
SODIUM: 134 mmol/L — AB (ref 135–145)
Total Bilirubin: 0.7 mg/dL (ref 0.3–1.2)
Total Protein: 7.1 g/dL (ref 6.5–8.1)

## 2016-02-15 LAB — ETHANOL: Alcohol, Ethyl (B): <5 mg/dL (ref <5)

## 2016-02-15 LAB — CBC
HCT: 45.4 % (ref 40.0–52.0)
HEMOGLOBIN: 16 g/dL (ref 13.0–18.0)
MCH: 30.1 pg (ref 26.0–34.0)
MCHC: 35.3 g/dL (ref 32.0–36.0)
MCV: 85.3 fL (ref 80.0–100.0)
Platelets: 267 10*3/uL (ref 150–440)
RBC: 5.33 MIL/uL (ref 4.40–5.90)
RDW: 14.4 % (ref 11.5–14.5)
WBC: 10.5 10*3/uL (ref 3.8–10.6)

## 2016-02-15 LAB — SALICYLATE LEVEL

## 2016-02-15 LAB — ACETAMINOPHEN LEVEL

## 2016-02-15 MED ORDER — CLOZAPINE 100 MG PO TABS
400.0000 mg | ORAL_TABLET | ORAL | Status: AC
  Administered 2016-02-15: 400 mg via ORAL
  Filled 2016-02-15: qty 4

## 2016-02-15 NOTE — ED Notes (Signed)
Pt still asleep. Even respirations. NAD

## 2016-02-15 NOTE — ED Triage Notes (Signed)
Pt presents to ED in custody of [employer]. Mother took out IVC papers that state pt is agitated, throwing things in the home, not taking his medication and that mother is afraid for her safety. Pt has no complaints at this time other than "I'm sleepy" and states he doesn't know why his mother took IVC papers out on him.

## 2016-02-15 NOTE — ED Notes (Signed)
Pt still sleeping will assess when awake.
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
## 2016-02-15 NOTE — ED Notes (Signed)
Pt's mother spoke to triage RN, states she wants pt's behavior controlled and that she is willing to take him back home once he has stabilized.

## 2016-02-15 NOTE — ED Notes (Signed)
Pt given breakfast tray and juice. 

## 2016-02-15 NOTE — ED Notes (Signed)
TTS at bedside. 

## 2016-02-15 NOTE — Discharge Instructions (Signed)
Please seek medical attention and help for any thoughts about wanting to harm herself, harm others, any concerning change in behavior, severe depression, inappropriate drug use or any other new or concerning symptoms. ° °
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
## 2016-02-15 NOTE — ED Notes (Signed)
Pt has not eaten any of his meals today. Has not drank anything
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
## 2016-02-15 NOTE — ED Notes (Signed)
Lunch was given to patient ,patient refused but placed on head of bed

## 2016-02-15 NOTE — ED Notes (Signed)
Given meal tray.

## 2016-02-15 NOTE — ED Notes (Signed)
TTS notified mother in lobby. He will speak to her.

## 2016-02-15 NOTE — ED Notes (Addendum)
Dr Cleda Daubklapacs at bedside. Pt yelling at doctor.

## 2016-02-15 NOTE — ED Notes (Signed)
Pt dressed out by EDT Kennedy Bucker(Thanh) with make [employer] officer present. All belongings placed in belongings bag.

## 2016-02-15 NOTE — ED Notes (Signed)
Pt refusing discharge vitals stating "they already took one".

## 2016-02-15 NOTE — ED Notes (Signed)
Pt demanding clothes and yelling at nurse threatening to sue because RN will not get clothes yet. Attempted to explain that doctor has to reverse IVC and get discharge papers before he can leave. Dr Cleda Daubklapacs also is going to order medication and informed pt he needs to take medicine before papers can be reversed. Pt and mother aware of this. Mother verbalized understand. Pt still upset.

## 2016-02-15 NOTE — ED Provider Notes (Signed)
Dr. Toni Amendlapacs with psychiatry has evaluated the patient. Has rescinded the IVC. Will discharge home.   Jeffrey SemenGraydon Jayliani Wanner, MD 02/15/16 1725

## 2016-02-15 NOTE — ED Notes (Signed)
Informed pt will move him to a different area while waiting on psychiatry. Pt got angry and states "i am not going over there, I refuse, it is against the law". Informed dr Mayford Knifewilliams [facility]. Pt can remain in quad until psych sees him and plan of care decided. Given phone to make call.

## 2016-04-26 ENCOUNTER — Emergency Department
Admission: EM | Admit: 2016-04-26 | Discharge: 2016-04-29 | Disposition: A | Discharge status: Other Acute Inpatient Hospital | Payer: Medicare Other | Attending: Emergency Medicine | Admitting: Emergency Medicine

## 2016-04-26 DIAGNOSIS — Z91199 Patient's noncompliance with other medical treatment and regimen due to unspecified reason: Secondary | ICD-10-CM

## 2016-04-26 DIAGNOSIS — Z9114 Patient's other noncompliance with medication regimen: Secondary | ICD-10-CM | POA: Diagnosis not present

## 2016-04-26 DIAGNOSIS — Z79899 Other long term (current) drug therapy: Secondary | ICD-10-CM | POA: Insufficient documentation

## 2016-04-26 DIAGNOSIS — F17210 Nicotine dependence, cigarettes, uncomplicated: Secondary | ICD-10-CM | POA: Diagnosis not present

## 2016-04-26 DIAGNOSIS — I10 Essential (primary) hypertension: Secondary | ICD-10-CM | POA: Diagnosis not present

## 2016-04-26 DIAGNOSIS — Z9119 Patient's noncompliance with other medical treatment and regimen: Secondary | ICD-10-CM

## 2016-04-26 DIAGNOSIS — F201 Disorganized schizophrenia: Secondary | ICD-10-CM

## 2016-04-26 DIAGNOSIS — F209 Schizophrenia, unspecified: Secondary | ICD-10-CM | POA: Insufficient documentation

## 2016-04-26 DIAGNOSIS — R258 Other abnormal involuntary movements: Secondary | ICD-10-CM | POA: Diagnosis present

## 2016-04-26 LAB — COMPREHENSIVE METABOLIC PANEL
ALK PHOS: 64 U/L (ref 38–126)
ALT: 14 U/L — AB (ref 17–63)
ANION GAP: 8 (ref 5–15)
AST: 17 U/L (ref 15–41)
Albumin: 4.2 g/dL (ref 3.5–5.0)
BUN: 13 mg/dL (ref 6–20)
CALCIUM: 8.9 mg/dL (ref 8.9–10.3)
CO2: 21 mmol/L — ABNORMAL LOW (ref 22–32)
CREATININE: 0.75 mg/dL (ref 0.61–1.24)
Chloride: 104 mmol/L (ref 101–111)
GFR calc non Af Amer: >60 mL/min (ref >60)
Glucose, Bld: 106 mg/dL — ABNORMAL HIGH (ref 65–99)
Potassium: 3.7 mmol/L (ref 3.5–5.1)
Sodium: 133 mmol/L — ABNORMAL LOW (ref 135–145)
Total Bilirubin: 0.8 mg/dL (ref 0.3–1.2)
Total Protein: 7.2 g/dL (ref 6.5–8.1)

## 2016-04-26 LAB — DIFFERENTIAL
BASOS ABS: 0.1 10*3/uL (ref 0–0.1)
Basophils Relative: 0 %
EOS ABS: 0 10*3/uL (ref 0–0.7)
EOS PCT: 0 %
LYMPHS ABS: 1.9 10*3/uL (ref 1.0–3.6)
Lymphocytes Relative: 11 %
Monocytes Absolute: 0.9 10*3/uL (ref 0.2–1.0)
Monocytes Relative: 5 %
NEUTROS PCT: 84 %
Neutro Abs: 14 10*3/uL — ABNORMAL HIGH (ref 1.4–6.5)

## 2016-04-26 LAB — CBC
HCT: 43.8 % (ref 40.0–52.0)
Hemoglobin: 15.2 g/dL (ref 13.0–18.0)
MCH: 29.8 pg (ref 26.0–34.0)
MCHC: 34.7 g/dL (ref 32.0–36.0)
MCV: 86 fL (ref 80.0–100.0)
PLATELETS: 364 10*3/uL (ref 150–440)
RBC: 5.09 MIL/uL (ref 4.40–5.90)
RDW: 13.8 % (ref 11.5–14.5)
WBC: 17.1 10*3/uL — AB (ref 3.8–10.6)

## 2016-04-26 LAB — URINE DRUG SCREEN, QUALITATIVE (ARMC ONLY)
AMPHETAMINES, UR SCREEN: NOT DETECTED
Barbiturates, Ur Screen: NOT DETECTED
Benzodiazepine, Ur Scrn: NOT DETECTED
CANNABINOID 50 NG, UR ~~LOC~~: NOT DETECTED
Cocaine Metabolite,Ur ~~LOC~~: NOT DETECTED
MDMA (ECSTASY) UR SCREEN: NOT DETECTED
Methadone Scn, Ur: NOT DETECTED
Opiate, Ur Screen: NOT DETECTED
Phencyclidine (PCP) Ur S: NOT DETECTED
TRICYCLIC, UR SCREEN: NOT DETECTED

## 2016-04-26 LAB — ETHANOL: Alcohol, Ethyl (B): <5 mg/dL (ref <5)

## 2016-04-26 MED ORDER — CLOZAPINE 25 MG PO TABS
ORAL_TABLET | ORAL | Status: AC
  Administered 2016-04-26: 100 mg via ORAL
  Filled 2016-04-26: qty 4

## 2016-04-26 MED ORDER — CLOZAPINE 100 MG PO TABS
400.0000 mg | ORAL_TABLET | Freq: Every day | ORAL | Status: DC
  Administered 2016-04-26 – 2016-04-28 (×4): 400 mg via ORAL
  Filled 2016-04-26 (×3): qty 4

## 2016-04-26 MED ORDER — DIVALPROEX SODIUM 500 MG PO DR TAB
1000.0000 mg | DELAYED_RELEASE_TABLET | ORAL | Status: DC
  Administered 2016-04-27 – 2016-04-29 (×3): 1000 mg via ORAL
  Filled 2016-04-26 (×4): qty 2

## 2016-04-26 MED ORDER — SENNA 8.6 MG PO TABS
1.0000 | ORAL_TABLET | Freq: Every day | ORAL | Status: DC
  Administered 2016-04-26 – 2016-04-28 (×3): 8.6 mg via ORAL
  Filled 2016-04-26 (×4): qty 1

## 2016-04-26 MED ORDER — CLOZAPINE 100 MG PO TABS
100.0000 mg | ORAL_TABLET | Freq: Every day | ORAL | Status: DC
  Administered 2016-04-26 – 2016-04-29 (×4): 100 mg via ORAL
  Filled 2016-04-26 (×3): qty 1
  Filled 2016-04-26: qty 4

## 2016-04-26 MED ORDER — DIVALPROEX SODIUM 500 MG PO DR TAB
1500.0000 mg | DELAYED_RELEASE_TABLET | Freq: Every day | ORAL | Status: DC
  Administered 2016-04-26 – 2016-04-28 (×4): 1500 mg via ORAL
  Filled 2016-04-26 (×3): qty 3

## 2016-04-26 MED ORDER — ATENOLOL 25 MG PO TABS
50.0000 mg | ORAL_TABLET | Freq: Every day | ORAL | Status: DC
  Administered 2016-04-26 – 2016-04-29 (×3): 50 mg via ORAL
  Filled 2016-04-26 (×4): qty 2

## 2016-04-26 NOTE — ED Notes (Signed)
IVC 

## 2016-04-26 NOTE — ED Notes (Addendum)
Spoke with Myriam Forehand from DSS, she reports since pts mother has recently passed that they have filed a petition to take over custody of patient. She wishes to be called if pt status is changed or if patient is admitted or discharged. Her Number is [phone].

## 2016-04-26 NOTE — ED Notes (Signed)
Patient refused to talk with dr.Clapaac

## 2016-04-26 NOTE — ED Notes (Signed)
ED BHU PLACEMENT JUSTIFICATION Is the patient under IVC or is there intent for IVC: Yes.   Is the patient medically cleared: Yes.   Is there vacancy in the ED BHU: Yes.   Is the population mix appropriate for patient: Yes.   Is the patient awaiting placement in inpatient or outpatient setting: Yes.   Has the patient had a psychiatric consult: Yes.   Survey of unit performed for contraband, proper placement and condition of furniture, tampering with fixtures in bathroom, shower, and each patient room: Yes.   APPEARANCE/BEHAVIOR adequate rapport can be established NEURO ASSESSMENT Orientation: time, place and person Hallucinations: No.None noted (Hallucinations) Speech: Normal Gait: normal RESPIRATORY ASSESSMENT Normal expansion.  Clear to auscultation.  No rales, rhonchi, or wheezing. CARDIOVASCULAR ASSESSMENT regular rate and rhythm, S1, S2 normal, no murmur, click, rub or gallop GASTROINTESTINAL ASSESSMENT soft, nontender, BS WNL, no r/g EXTREMITIES normal strength, tone, and muscle mass PLAN OF CARE Provide calm/safe environment. Vital signs assessed twice daily. ED BHU Assessment once each 12-hour shift. Collaborate with intake RN daily or as condition indicates. Assure the ED provider has rounded once each shift. Provide and encourage hygiene. Provide redirection as needed. Assess for escalating behavior; address immediately and inform ED provider.  Assess family dynamic and appropriateness for visitation as needed: Yes.   Educate the patient/family about BHU procedures/visitation: Yes.   

## 2016-04-26 NOTE — ED Notes (Signed)
Patient in bathroom

## 2016-04-26 NOTE — ED Notes (Signed)
Offered patient shower patient refused
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
## 2016-04-26 NOTE — BHH Counselor (Signed)
Patient has been accepted by [facility]). Accepting MD:  Dr. Mickel Baas Call report: [phone] Address:   [address] [place], [place] [address]

## 2016-04-26 NOTE — BHH Counselor (Signed)
TTS attempted to assess pt; he reports he "doesn't feel up to talking right now". TTS staff ended assessment and informed pt's nurse and Dr. Toni Amend.

## 2016-04-26 NOTE — BHH Counselor (Signed)
Per Dr. Toni Amend, pt meets criteria for inpatient hospitalization.  Referral for placement submitted to:  [facility] [phone])  [facility] [phone])  [facility] [phone])  [facility] [phone])  [facility] [phone])  [facility] [phone]

## 2016-04-26 NOTE — Consult Note (Signed)
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 Noncompliance [Z91.19] 02/15/2016  . 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 Schizophrenia ([place]) [F20.9] 11/08/2014  . Tobacco use disorder [F17.200] 06/30/2014  . 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 Hypertension [I10] 06/29/2014  . Hyponatremia [E87.1] 06/29/2014  . Laceration of neck [S11.91XA] 06/29/2014  . Suicidal behavior [R46.89] 06/29/2014    Total Time spent with patient: 45 minutes  Subjective:   Jeffrey [name] is a 56 y.o. male patient admitted with "don't talk to me".  HPI:  Attempted to interview patient without any real success. Chart reviewed. Patient known from previous encounters. 56 year old man with long-standing schizophrenia brought in under commitment reporting that he is noncompliant with treatment. Also, very unfortunately for the patient, it is reported that his mother died just about 3 days ago. Patient is refusing to talk to me which is pretty typical for him. When I tried to address him he clearly is listening and responsible only tell me to stop talking to him because he is busy sleeping.  Social history: This patient has been dependent on his mother pretty much lifelong. Having his mother die is likely to be a pretty big catastrophe for him. Multiple attempts of been made in the past place him in group home settings without any lasting success.  Medical history: Hypertension. Poor self-care. 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  Substance abuse history: No active substance abuse any time in the last  few years. Reportedly there may have been some issue with this when he was younger but not recently.  Past Psychiatric History: Long-standing psychotic disorder also long-standing problems with noncompliance. Multiple hospitalizations including state hospitalizations over the years. Currently on clozapine which is reasonably effective as long as he takes it. Mother was usually able to keep him on it although not always. Patient has a history of suicide attempts, dramatic self-mutilation and violence in the past when psychotic.  Risk to Self: Is patient at risk for suicide?: No Risk to Others:   Prior Inpatient Therapy:   Prior Outpatient Therapy:    Past Medical History:  Past Medical History:  Diagnosis Date  . Hypertension   . Schizophrenia ([place])    History reviewed. No pertinent surgical history. Family History: No family history on file. Family Psychiatric  History: Unknown Social History:  History  Alcohol Use No     History  Drug Use No    Social History   Social History  . Marital status: Single    Spouse name: N/A  . Number of children: N/A  . Years of education: N/A   Social History Main Topics  . Smoking status: Current Every Day Smoker    Packs/day: 1.00    Types: Cigarettes  . Smokeless tobacco: Current User  . Alcohol use No  . Drug use: No  . Sexual activity: Not Asked   Other Topics Concern  . None   Social History Narrative  . None   Additional Social History:    Allergies:  No Known Allergies  Labs:  Results for orders placed or performed during the hospital encounter of 04/26/16 (from the  past 48 hour(s))  Comprehensive metabolic panel     Status: Abnormal   Collection Time: 04/26/16 11:26 AM  Result Value Ref Range   Sodium 133 (L) 135 - 145 mmol/L   Potassium 3.7 3.5 - 5.1 mmol/L   Chloride 104 101 - 111 mmol/L   CO2 21 (L) 22 - 32 mmol/L   Glucose, Bld 106 (H) 65 - 99 mg/dL   BUN 13 6 - 20 mg/dL   Creatinine, Ser 0.75 0.61 - 1.24  mg/dL   Calcium 8.9 8.9 - 10.3 mg/dL   Total Protein 7.2 6.5 - 8.1 g/dL   Albumin 4.2 3.5 - 5.0 g/dL   AST 17 15 - 41 U/L   ALT 14 (L) 17 - 63 U/L   Alkaline Phosphatase 64 38 - 126 U/L   Total Bilirubin 0.8 0.3 - 1.2 mg/dL   GFR calc non Af Amer >60 >60 mL/min   GFR calc Af Amer >60 >60 mL/min    Comment: (NOTE) The eGFR has been calculated using the CKD EPI equation. This calculation has not been validated in all clinical situations. eGFR's persistently <60 mL/min signify possible Chronic Kidney Disease.    Anion gap 8 5 - 15  Ethanol     Status: None   Collection Time: 04/26/16 11:26 AM  Result Value Ref Range   Alcohol, Ethyl (B) <5 <5 mg/dL    Comment:        LOWEST DETECTABLE LIMIT FOR SERUM ALCOHOL IS 5 mg/dL FOR MEDICAL PURPOSES ONLY   cbc     Status: Abnormal   Collection Time: 04/26/16 11:26 AM  Result Value Ref Range   WBC 17.1 (H) 3.8 - 10.6 K/uL   RBC 5.09 4.40 - 5.90 MIL/uL   Hemoglobin 15.2 13.0 - 18.0 g/dL   HCT 43.8 40.0 - 52.0 %   MCV 86.0 80.0 - 100.0 fL   MCH 29.8 26.0 - 34.0 pg   MCHC 34.7 32.0 - 36.0 g/dL   RDW 13.8 11.5 - 14.5 %   Platelets 364 150 - 440 K/uL  Urine Drug Screen, Qualitative     Status: None   Collection Time: 04/26/16 11:26 AM  Result Value Ref Range   Tricyclic, Ur Screen NONE DETECTED NONE DETECTED   Amphetamines, Ur Screen NONE DETECTED NONE DETECTED   MDMA (Ecstasy)Ur Screen NONE DETECTED NONE DETECTED   Cocaine Metabolite,Ur [place] NONE DETECTED NONE DETECTED   Opiate, Ur Screen NONE DETECTED NONE DETECTED   Phencyclidine (PCP) Ur S NONE DETECTED NONE DETECTED   Cannabinoid 50 Ng, Ur [place] NONE DETECTED NONE DETECTED   Barbiturates, Ur Screen NONE DETECTED NONE DETECTED   Benzodiazepine, Ur Scrn NONE DETECTED NONE DETECTED   Methadone Scn, Ur NONE DETECTED NONE DETECTED    Comment: (NOTE) 233  Tricyclics, urine               Cutoff 1000 ng/mL 200  Amphetamines, urine             Cutoff 1000 ng/mL 300  MDMA (Ecstasy), urine            Cutoff 500 ng/mL 400  Cocaine Metabolite, urine       Cutoff 300 ng/mL 500  Opiate, urine                   Cutoff 300 ng/mL 600  Phencyclidine (PCP), urine      Cutoff 25 ng/mL 700  Cannabinoid, urine  Cutoff 50 ng/mL 800  Barbiturates, urine             Cutoff 200 ng/mL 900  Benzodiazepine, urine           Cutoff 200 ng/mL 1000 Methadone, urine                Cutoff 300 ng/mL 1100 1200 The urine drug screen provides only a preliminary, unconfirmed 1300 analytical test result and should not be used for non-medical 1400 purposes. Clinical consideration and professional judgment should 1500 be applied to any positive drug screen result due to possible 1600 interfering substances. A more specific alternate chemical method 1700 must be used in order to obtain a confirmed analytical result.  1800 Gas chromato graphy / mass spectrometry (GC/MS) is the preferred 1900 confirmatory method.     Current Facility-Administered Medications  Medication Dose Route Frequency Provider Last Rate Last Dose  . atenolol (TENORMIN) tablet 50 mg  50 mg Oral Daily Gonzella Lex, MD      . cloZAPine (CLOZARIL) tablet 100 mg  100 mg Oral Daily Gonzella Lex, MD      . cloZAPine (CLOZARIL) tablet 400 mg  400 mg Oral QHS Gonzella Lex, MD      . Derrill Memo ON 04/27/2016] divalproex (DEPAKOTE) DR tablet 1,000 mg  1,000 mg Oral BH-q7a John T Clapacs, MD      . divalproex (DEPAKOTE) DR tablet 1,500 mg  1,500 mg Oral QHS Gonzella Lex, MD      . senna (SENOKOT) tablet 8.6 mg  1 tablet Oral Daily Gonzella Lex, MD       Current Outpatient Prescriptions  Medication Sig Dispense Refill  . cloZAPine (CLOZARIL) 100 MG tablet Take 1-4 tablets (100-400 mg total) by mouth 2 (two) times daily. Patient takes 1 tablet (100 mg) in the morning and 4 tablets (400 mg) at bedtime. (Patient not taking: Reported on 04/26/2016) 150 tablet 0  . senna (SENOKOT) 8.6 MG TABS tablet Take 2 tablets (17.2 mg total) by mouth  at bedtime. (Patient not taking: Reported on 04/26/2016) 120 each 0  . simvastatin (ZOCOR) 40 MG tablet Take 40 mg by mouth at bedtime.      Musculoskeletal: Strength & Muscle Tone: within normal limits Gait & Station: normal Patient leans: N/A  Psychiatric Specialty Exam: Physical Exam  Nursing note and vitals reviewed. Constitutional: He appears well-developed and well-nourished.  HENT:  Head: Normocephalic and atraumatic.  Eyes: Conjunctivae are normal. Pupils are equal, round, and reactive to light.  Neck: Normal range of motion.  Cardiovascular: Regular rhythm and normal heart sounds.   Respiratory: Effort normal and breath sounds normal.  GI: Soft.  Musculoskeletal: Normal range of motion.       Arms: Neurological: He is alert.  Skin: Skin is warm and dry.  Psychiatric: His affect is blunt. He is withdrawn. Thought content is paranoid. He is noncommunicative.    Review of Systems  Unable to perform ROS: Psychiatric disorder    Blood pressure (!) 110/51, pulse 99, temperature 98.4 F (36.9 C), temperature source Oral, resp. rate 16, height 5' 8"  (1.727 m), weight 90.3 kg (199 lb), SpO2 98 %.Body mass index is 30.26 kg/m.  General Appearance: Disheveled  Eye Contact:  None  Speech:  Slow  Volume:  Decreased  Mood:  Negative  Affect:  Flat  Thought Process:  NA  Orientation:  Negative  Thought Content:  Negative  Suicidal Thoughts:  No  Homicidal Thoughts:  No  Memory:  Immediate;   Poor Recent;   Poor Remote;   Poor  Judgement:  Impaired  Insight:  Shallow  Psychomotor Activity:  Decreased  Concentration:  Concentration: Poor  Recall:  [employer] of Knowledge:  Fair  Language:  Fair  Akathisia:  No  Handed:  Right  AIMS (if indicated):     Assets:  Physical Health Resilience  ADL's:  Impaired  Cognition:  Impaired,  Mild  Sleep:        Treatment Plan Summary: Daily contact with patient to assess and evaluate symptoms and progress in treatment,  Medication management and Plan 56 year old man with schizophrenia. Currently very withdrawn and uncommunicative. Based on his past history it is almost certain that he is at a crisis and would not be able to be functioning outside the hospital. Clearly has serious mental illness. He will be admitted to the psychiatric ward. Orders placed to continue his clozapine also restart the Depakote that had been used in the past as well as the atenolol for his blood pressure. 15 minute checks full set of labs.  Disposition: Recommend psychiatric Inpatient admission when medically cleared.  Alethia Berthold, MD 04/26/2016 6:48 PM

## 2016-04-26 NOTE — ED Triage Notes (Signed)
Pt bib BPD w/ IVC papers.  According to papers, pts caregiver died 3 days ago, since pt hasn't been taking medication.  Pt alert, able to answer most questions but also replies nonsense.  Pt calm and cooperative at this time. Resp even and unlabored, ambulatory w/o issue. Pt denies SI/HI, ETOH or drug use.

## 2016-04-26 NOTE — Progress Notes (Signed)
MEDICATION RELATED CONSULT NOTE - INITIAL   Pharmacy Consulted per protocol clozapine eligibility checks.   Assessment and plan: 4/6 ANC = 1400 Per [facility], patient is eligible for clozapine and should be monitored weekly  No Known Allergies  Patient Measurements: Height:  (172.7 cm) Weight: 199 lb (90.3 kg) IBW/kg (Calculated) : 68.4   Vital Signs: Temp: 98.4 F (36.9 C) (04/06 1409) Temp Source: Oral (04/06 1409) BP: 110/51 (04/06 1409) Pulse Rate: 99 (04/06 1124) Intake/Output from previous day: No intake/output data recorded. Intake/Output from this shift: No intake/output data recorded.  Labs:  Recent Labs  04/26/16 1126  WBC 17.1*  HGB 15.2  HCT 43.8  PLT 364  CREATININE 0.75  ALBUMIN 4.2  PROT 7.2  AST 17  ALT 14*  ALKPHOS 64  BILITOT 0.8   Estimated Creatinine Clearance: 115.3 mL/min (by C-G formula based on SCr of 0.75 mg/dL).   Microbiology: No results found for this or any previous visit (from the past 720 hour(s)).  Medical History: Past Medical History:  Diagnosis Date  . Hypertension   . Schizophrenia ([facility])     Horris Latino, PharmD Pharmacy Resident 04/26/2016 8:33 PM

## 2016-04-26 NOTE — ED Notes (Signed)
Report was received from Jeffrey [name]., RN; Pt. Verbalizes  complaints of Depression and hopelessness; distress; irritability; denies S.I./Hi. Continue to monitor with 15 min. Monitoring.

## 2016-04-26 NOTE — ED Notes (Signed)
The patient was dressed out into the required purple scrubs. His belongings were placed inside a white patient belongs bag labeled properly and given to the quad RN. He was walked to hallway bed 20 without any issues.

## 2016-04-27 DIAGNOSIS — F209 Schizophrenia, unspecified: Secondary | ICD-10-CM | POA: Diagnosis not present

## 2016-04-27 NOTE — ED Notes (Signed)
Pt easily agitated, appears to be responding to internal stimuli and disorganized thoughts, yells at staff, wants to be left alone, does not want to answer any questions at this time.

## 2016-04-27 NOTE — ED Notes (Signed)
Patient sitting up in bed eating. Pt supported emotionally and encouraged to express concerns and ask questions. Pt denies needing anything and doesn't want to be bothered,  stating, "I'll let you know if I need anything." Pt remains safe with 15 minute checks.

## 2016-04-27 NOTE — ED Notes (Signed)
Patient's mood brighter- thanking staff for making his bed

## 2016-04-27 NOTE — ED Notes (Signed)
Pt refused to have vital signs taken.

## 2016-04-27 NOTE — Progress Notes (Signed)
LCSW was informed by BMU that patient is going to [facility].  LCSW asked DSS-Lisa Lowell Guitar to send email to [facility] 0 to let her know patient was here in bed 1 [facility]U and will be transporting to [facility] this afternoon. Left a message ) HIPPA friendly   Miki Labuda LCSW

## 2016-04-27 NOTE — ED Notes (Signed)
Pt ambulated to the bathroom independently, came to staff stating he wants "someone to come pick me up and get me out of here", pt appeared sad and upset, emotional support attempted, pt not receptive.

## 2016-04-27 NOTE — ED Notes (Signed)
RN notified that patient unable to be transported today to [facility]- no sheriff available to transport

## 2016-04-27 NOTE — ED Provider Notes (Signed)
-----------------------------------------   7:22 AM on 04/27/2016 -----------------------------------------   Blood pressure 105/67, pulse 67, temperature 98.4 F (36.9 C), temperature source Oral, resp. rate 18, height  (1.727 m), weight 199 lb (90.3 kg), SpO2 98 %.  Patient had no acute events overnight. Medical workup is largely nonrevealing. Per TTS note the patient has been accepted by [facility] and is awaiting transportation.     Minna Antis, MD 04/27/16 [phone]

## 2016-04-27 NOTE — ED Notes (Signed)
IVC 

## 2016-04-27 NOTE — ED Notes (Signed)
Pt requesting to take a shower. Pt given new scrubs, socks, toiletries and towels. Pt in shower

## 2016-04-27 NOTE — ED Notes (Signed)
Transport will not be available until 04/28/16 in morning, should be called in for transport by Sheriff's transport  tomorrow morning.

## 2016-04-27 NOTE — ED Notes (Signed)
Report given Jeffrey [name]

## 2016-04-27 NOTE — ED Notes (Signed)
Spoke with Dr. Toni Amend about plan of care for patient, informed him that patient refused to have vitals taken and that Atenolol medication was not given, also discussed elevated WBC count and last night's episode of incontinence, no new orders given at this time.

## 2016-04-28 DIAGNOSIS — F209 Schizophrenia, unspecified: Secondary | ICD-10-CM | POA: Diagnosis not present

## 2016-04-28 MED ORDER — HYDROXYZINE HCL 25 MG PO TABS
50.0000 mg | ORAL_TABLET | Freq: Once | ORAL | Status: AC
  Administered 2016-04-28: 50 mg via ORAL
  Filled 2016-04-28: qty 2

## 2016-04-28 NOTE — ED Notes (Signed)
Report was received from Colin Broach., RN; Pt. Has been anxious and restless; with talking to himself and pacing;  denies S.I./Hi. Continue to monitor with 15 min. Monitoring.

## 2016-04-28 NOTE — ED Notes (Signed)
Per Diplomatic Services operational officer, pt won't be transported today.

## 2016-04-28 NOTE — ED Notes (Signed)
ED BHU PLACEMENT JUSTIFICATION Is the patient under IVC or is there intent for IVC: Yes.   Is the patient medically cleared: Yes.   Is there vacancy in the ED BHU: Yes.   Is the population mix appropriate for patient: Yes.   Is the patient awaiting placement in inpatient or outpatient setting: Yes.   Has the patient had a psychiatric consult: Yes.   Survey of unit performed for contraband, proper placement and condition of furniture, tampering with fixtures in bathroom, shower, and each patient room: Yes.   APPEARANCE/BEHAVIOR cooperative and adequate rapport can be established NEURO ASSESSMENT Orientation: place and person Hallucinations: Yes.  Auditory Hallucinations and Visual Hallucinations Speech: Normal Gait: normal RESPIRATORY ASSESSMENT Normal expansion.  Clear to auscultation.  No rales, rhonchi, or wheezing. CARDIOVASCULAR ASSESSMENT regular rate and rhythm, S1, S2 normal, no murmur, click, rub or gallop GASTROINTESTINAL ASSESSMENT soft, nontender, BS WNL, no r/g EXTREMITIES normal strength, tone, and muscle mass PLAN OF CARE Provide calm/safe environment. Vital signs assessed twice daily. ED BHU Assessment once each 12-hour shift. Collaborate with intake RN daily or as condition indicates. Assure the ED provider has rounded once each shift. Provide and encourage hygiene. Provide redirection as needed. Assess for escalating behavior; address immediately and inform ED provider.  Assess family dynamic and appropriateness for visitation as needed: Yes.     Educate the patient/family about BHU procedures/visitation: Yes.

## 2016-04-28 NOTE — ED Provider Notes (Signed)
-----------------------------------------   8:31 AM on 04/28/2016 -----------------------------------------   BP 109/76 (BP Location: Left Arm)   Pulse 97   Temp 97.7 F (36.5 C) (Oral)   Resp 18   Ht  (1.727 m)   Wt 90.3 kg   SpO2 97%   BMI 30.26 kg/m   No acute events overnight. Vitals reviewed. Patient remains medically cleared.  Disposition is pending per Psychiatry/Behavioral Medicine team recommendations.    Jene Every, MD 04/28/16 [phone]

## 2016-04-29 DIAGNOSIS — F209 Schizophrenia, unspecified: Secondary | ICD-10-CM | POA: Diagnosis not present

## 2016-04-29 NOTE — ED Notes (Signed)
Pt was woken up for vs and told  He was being transported to [facility] at this time. Report called to [facility]. To Traci Powers rn . Pt left with sheriff he has no c/o pain was coperative , got dressed ate breakfast and gave meds with no problems , pt appears in no distress and was very cooperaive with officer

## 2016-04-29 NOTE — ED Provider Notes (Signed)
-----------------------------------------   6:58 AM on 04/29/2016 -----------------------------------------   Blood pressure 109/69, pulse 67, temperature 98 F (36.7 C), temperature source Oral, resp. rate 18, height  (1.727 m), weight 199 lb (90.3 kg), SpO2 99 %.  The patient had no acute events since last update.  Calm and cooperative at this time. [facility], MD 04/29/16 [phone]

## 2016-05-26 ENCOUNTER — Emergency Department
Admission: EM | Admit: 2016-05-26 | Discharge: 2016-05-27 | Disposition: A | Discharge status: Other Acute Inpatient Hospital | Payer: Medicare Other | Attending: Emergency Medicine | Admitting: Emergency Medicine

## 2016-05-26 DIAGNOSIS — F17290 Nicotine dependence, other tobacco product, uncomplicated: Secondary | ICD-10-CM | POA: Insufficient documentation

## 2016-05-26 DIAGNOSIS — Z046 Encounter for general psychiatric examination, requested by authority: Secondary | ICD-10-CM | POA: Diagnosis present

## 2016-05-26 DIAGNOSIS — Z79899 Other long term (current) drug therapy: Secondary | ICD-10-CM | POA: Diagnosis not present

## 2016-05-26 DIAGNOSIS — F201 Disorganized schizophrenia: Secondary | ICD-10-CM | POA: Diagnosis not present

## 2016-05-26 DIAGNOSIS — Z9119 Patient's noncompliance with other medical treatment and regimen: Secondary | ICD-10-CM

## 2016-05-26 DIAGNOSIS — E785 Hyperlipidemia, unspecified: Secondary | ICD-10-CM

## 2016-05-26 DIAGNOSIS — F17210 Nicotine dependence, cigarettes, uncomplicated: Secondary | ICD-10-CM | POA: Diagnosis not present

## 2016-05-26 DIAGNOSIS — K5900 Constipation, unspecified: Secondary | ICD-10-CM

## 2016-05-26 DIAGNOSIS — R410 Disorientation, unspecified: Secondary | ICD-10-CM | POA: Insufficient documentation

## 2016-05-26 DIAGNOSIS — I10 Essential (primary) hypertension: Secondary | ICD-10-CM | POA: Diagnosis not present

## 2016-05-26 DIAGNOSIS — Z91199 Patient's noncompliance with other medical treatment and regimen due to unspecified reason: Secondary | ICD-10-CM

## 2016-05-26 LAB — CBC
HEMATOCRIT: 41.7 % (ref 40.0–52.0)
Hemoglobin: 14.7 g/dL (ref 13.0–18.0)
MCH: 30.3 pg (ref 26.0–34.0)
MCHC: 35.1 g/dL (ref 32.0–36.0)
MCV: 86.2 fL (ref 80.0–100.0)
Platelets: 259 10*3/uL (ref 150–440)
RBC: 4.84 MIL/uL (ref 4.40–5.90)
RDW: 13.6 % (ref 11.5–14.5)
WBC: 10 10*3/uL (ref 3.8–10.6)

## 2016-05-26 LAB — COMPREHENSIVE METABOLIC PANEL
ALT: 19 U/L (ref 17–63)
AST: 23 U/L (ref 15–41)
Albumin: 4.3 g/dL (ref 3.5–5.0)
Alkaline Phosphatase: 53 U/L (ref 38–126)
Anion gap: 10 (ref 5–15)
BUN: 12 mg/dL (ref 6–20)
CHLORIDE: 96 mmol/L — AB (ref 101–111)
CO2: 25 mmol/L (ref 22–32)
CREATININE: 0.76 mg/dL (ref 0.61–1.24)
Calcium: 8.9 mg/dL (ref 8.9–10.3)
Glucose, Bld: 101 mg/dL — ABNORMAL HIGH (ref 65–99)
POTASSIUM: 3.4 mmol/L — AB (ref 3.5–5.1)
SODIUM: 131 mmol/L — AB (ref 135–145)
TOTAL PROTEIN: 7.2 g/dL (ref 6.5–8.1)
Total Bilirubin: 0.7 mg/dL (ref 0.3–1.2)

## 2016-05-26 LAB — URINE DRUG SCREEN, QUALITATIVE (ARMC ONLY)
AMPHETAMINES, UR SCREEN: NOT DETECTED
BARBITURATES, UR SCREEN: NOT DETECTED
Benzodiazepine, Ur Scrn: NOT DETECTED
Cannabinoid 50 Ng, Ur ~~LOC~~: NOT DETECTED
Cocaine Metabolite,Ur ~~LOC~~: NOT DETECTED
MDMA (Ecstasy)Ur Screen: NOT DETECTED
METHADONE SCREEN, URINE: NOT DETECTED
OPIATE, UR SCREEN: NOT DETECTED
Phencyclidine (PCP) Ur S: NOT DETECTED
TRICYCLIC, UR SCREEN: NOT DETECTED

## 2016-05-26 LAB — ETHANOL

## 2016-05-26 MED ORDER — CLOZAPINE 100 MG PO TABS: 100.0000 mg | ORAL_TABLET | Freq: Two times a day (BID) | ORAL | Status: DC

## 2016-05-26 MED ORDER — SIMVASTATIN 40 MG PO TABS
40.0000 mg | ORAL_TABLET | Freq: Every day | ORAL | Status: DC
  Administered 2016-05-26: 40 mg via ORAL
  Filled 2016-05-26: qty 1

## 2016-05-26 MED ORDER — CLOZAPINE 100 MG PO TABS
400.0000 mg | ORAL_TABLET | Freq: Every day | ORAL | Status: DC
  Administered 2016-05-26: 400 mg via ORAL
  Filled 2016-05-26: qty 4

## 2016-05-26 MED ORDER — SENNA 8.6 MG PO TABS
2.0000 | ORAL_TABLET | Freq: Every day | ORAL | Status: DC
  Administered 2016-05-26: 17.2 mg via ORAL
  Filled 2016-05-26 (×2): qty 2

## 2016-05-26 MED ORDER — PALIPERIDONE ER 3 MG PO TB24
6.0000 mg | ORAL_TABLET | Freq: Two times a day (BID) | ORAL | Status: DC
  Administered 2016-05-26 – 2016-05-27 (×2): 6 mg via ORAL
  Filled 2016-05-26: qty 2
  Filled 2016-05-26: qty 1

## 2016-05-26 MED ORDER — TRAZODONE HCL 100 MG PO TABS
200.0000 mg | ORAL_TABLET | Freq: Every day | ORAL | Status: DC
Start: 2016-05-26 — End: 2016-05-27
  Administered 2016-05-26: 200 mg via ORAL
  Filled 2016-05-26 (×2): qty 2

## 2016-05-26 MED ORDER — SENNOSIDES-DOCUSATE SODIUM 8.6-50 MG PO TABS
ORAL_TABLET | ORAL | Status: AC
  Administered 2016-05-26: 1
  Filled 2016-05-26: qty 2

## 2016-05-26 MED ORDER — CLOZAPINE 100 MG PO TABS
100.0000 mg | ORAL_TABLET | Freq: Every day | ORAL | Status: DC
  Administered 2016-05-27: 100 mg via ORAL
  Filled 2016-05-26: qty 1

## 2016-05-26 NOTE — ED Notes (Signed)
Report was received from Melchor AmourAllison P., RN; Pt. Verbalizes  complaints of having auditory and visual hallucinations; and distress  of being non-compliant with medications; denies S.I./Hi. Continue to monitor with 15 min. Monitoring.

## 2016-05-26 NOTE — ED Notes (Signed)
Pt eating second sandwich tray at this time.

## 2016-05-26 NOTE — Progress Notes (Signed)
TTS spoke with Dr. Toni Amendlapacs.  He will reassess the patient tomorrow morning.

## 2016-05-26 NOTE — ED Triage Notes (Signed)
Pt unable to provide hx but has been talking to someone in triage that is not there. [place]  IVC paperwork taken out on patient by Bea LauraSherry Comstock off [address] Pt denies any SI/HI, denies hearing voices patient continues to mumble in triage talking about a house. Per IVC paperwork [place] was [place] and given new medication but has not been taking it and she is  Worried about confusion and the patient having a hard time understanding things.

## 2016-05-26 NOTE — Progress Notes (Signed)
Clozaril monitoring  Patient eligible with every 4 week monitoring per [facility] website. CBC with diff ordered with tomorrow AM labs.

## 2016-05-26 NOTE — ED Notes (Signed)
SOC at bedside at this time,
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
## 2016-05-26 NOTE — ED Notes (Signed)
ED BHU PLACEMENT JUSTIFICATION Is the patient under IVC or is there intent for IVC: Yes.   Is the patient medically cleared: Yes.   Is there vacancy in the ED BHU: Yes.   Is the population mix appropriate for patient: Yes.   Is the patient awaiting placement in inpatient or outpatient setting: Yes.   Has the patient had a psychiatric consult: Yes.   Survey of unit performed for contraband, proper placement and condition of furniture, tampering with fixtures in bathroom, shower, and each patient room: Yes.   APPEARANCE/BEHAVIOR adequate rapport can be established NEURO ASSESSMENT Orientation: person Hallucinations: Yes.  Auditory Hallucinations and Visual Hallucinations Speech: Normal Gait: normal RESPIRATORY ASSESSMENT Normal expansion.  Clear to auscultation.  No rales, rhonchi, or wheezing. CARDIOVASCULAR ASSESSMENT regular rate and rhythm, S1, S2 normal, no murmur, click, rub or gallop GASTROINTESTINAL ASSESSMENT soft, nontender, BS WNL, no r/g EXTREMITIES normal strength, tone, and muscle mass PLAN OF CARE Provide calm/safe environment. Vital signs assessed twice daily. ED BHU Assessment once each 12-hour shift. Collaborate with intake RN daily or as condition indicates. Assure the ED provider has rounded once each shift. Provide and encourage hygiene. Provide redirection as needed. Assess for escalating behavior; address immediately and inform ED provider.  Assess family dynamic and appropriateness for visitation as needed: Yes.   Educate the patient/family about BHU procedures/visitation: Yes.

## 2016-05-26 NOTE — ED Notes (Addendum)
This RN spoke with Bea LauraSherry Comstock, ACT RN (petitioner for IVC). This RN states IVC d/t pt noncompliant with new meds, recent loss (mother died), unable to care for self properly.   States [place] and [place]. Pt not adjusting well.   New guardian. Morey HummingbirdCassandra Massenburg [phone]   **See paperwork faxed from ACT on chart.

## 2016-05-27 ENCOUNTER — Inpatient Hospital Stay
Admission: RE | Admit: 2016-05-27 | Discharge: 2016-07-10 | DRG: 885 | Disposition: A | Discharge status: Home / Self Care | Payer: Medicare Other | Source: Intra-hospital | Attending: Psychiatry | Admitting: Psychiatry

## 2016-05-27 DIAGNOSIS — R410 Disorientation, unspecified: Secondary | ICD-10-CM | POA: Diagnosis not present

## 2016-05-27 DIAGNOSIS — K117 Disturbances of salivary secretion: Secondary | ICD-10-CM | POA: Diagnosis present

## 2016-05-27 DIAGNOSIS — F329 Major depressive disorder, single episode, unspecified: Secondary | ICD-10-CM | POA: Diagnosis present

## 2016-05-27 DIAGNOSIS — K5900 Constipation, unspecified: Secondary | ICD-10-CM | POA: Diagnosis present

## 2016-05-27 DIAGNOSIS — F201 Disorganized schizophrenia: Secondary | ICD-10-CM

## 2016-05-27 DIAGNOSIS — F17210 Nicotine dependence, cigarettes, uncomplicated: Secondary | ICD-10-CM | POA: Diagnosis present

## 2016-05-27 DIAGNOSIS — E785 Hyperlipidemia, unspecified: Secondary | ICD-10-CM

## 2016-05-27 DIAGNOSIS — Z7984 Long term (current) use of oral hypoglycemic drugs: Secondary | ICD-10-CM | POA: Diagnosis not present

## 2016-05-27 DIAGNOSIS — F203 Undifferentiated schizophrenia: Principal | ICD-10-CM | POA: Diagnosis present

## 2016-05-27 DIAGNOSIS — K5903 Drug induced constipation: Secondary | ICD-10-CM | POA: Diagnosis not present

## 2016-05-27 DIAGNOSIS — Z9114 Patient's other noncompliance with medication regimen: Secondary | ICD-10-CM | POA: Diagnosis not present

## 2016-05-27 DIAGNOSIS — G4700 Insomnia, unspecified: Secondary | ICD-10-CM | POA: Diagnosis present

## 2016-05-27 DIAGNOSIS — F17200 Nicotine dependence, unspecified, uncomplicated: Secondary | ICD-10-CM | POA: Diagnosis present

## 2016-05-27 DIAGNOSIS — I10 Essential (primary) hypertension: Secondary | ICD-10-CM | POA: Diagnosis present

## 2016-05-27 DIAGNOSIS — Z91199 Patient's noncompliance with other medical treatment and regimen due to unspecified reason: Secondary | ICD-10-CM

## 2016-05-27 DIAGNOSIS — Z79899 Other long term (current) drug therapy: Secondary | ICD-10-CM | POA: Diagnosis not present

## 2016-05-27 DIAGNOSIS — Z9119 Patient's noncompliance with other medical treatment and regimen: Secondary | ICD-10-CM

## 2016-05-27 LAB — BASIC METABOLIC PANEL
Anion gap: 5 (ref 5–15)
BUN: 9 mg/dL (ref 6–20)
CALCIUM: 8.6 mg/dL — AB (ref 8.9–10.3)
CHLORIDE: 102 mmol/L (ref 101–111)
CO2: 27 mmol/L (ref 22–32)
CREATININE: 0.75 mg/dL (ref 0.61–1.24)
GFR calc non Af Amer: >60 mL/min (ref >60)
GLUCOSE: 110 mg/dL — AB (ref 65–99)
Potassium: 4.2 mmol/L (ref 3.5–5.1)
Sodium: 134 mmol/L — ABNORMAL LOW (ref 135–145)

## 2016-05-27 LAB — CBC WITH DIFFERENTIAL/PLATELET
BASOS ABS: 0 10*3/uL (ref 0–0.1)
BASOS ABS: 0.1 10*3/uL (ref 0–0.1)
Basophils Relative: 1 %
Basophils Relative: 1 %
EOS ABS: 0 10*3/uL (ref 0–0.7)
EOS PCT: 0 %
Eosinophils Absolute: 0 10*3/uL (ref 0–0.7)
Eosinophils Relative: 0 %
HCT: 41.3 % (ref 40.0–52.0)
HCT: 41.2 % (ref 40.0–52.0)
Hemoglobin: 14.2 g/dL (ref 13.0–18.0)
Hemoglobin: 14.4 g/dL (ref 13.0–18.0)
LYMPHS ABS: 1.9 10*3/uL (ref 1.0–3.6)
LYMPHS PCT: 28 %
Lymphocytes Relative: 27 %
Lymphs Abs: 1.2 10*3/uL (ref 1.0–3.6)
MCH: 29.5 pg (ref 26.0–34.0)
MCH: 30 pg (ref 26.0–34.0)
MCHC: 34.4 g/dL (ref 32.0–36.0)
MCHC: 34.9 g/dL (ref 32.0–36.0)
MCV: 85.8 fL (ref 80.0–100.0)
MCV: 85.9 fL (ref 80.0–100.0)
MONO ABS: 0.9 10*3/uL (ref 0.2–1.0)
Monocytes Absolute: 0.5 10*3/uL (ref 0.2–1.0)
Monocytes Relative: 11 %
Monocytes Relative: 13 %
Neutro Abs: 2.7 10*3/uL (ref 1.4–6.5)
Neutro Abs: 3.9 10*3/uL (ref 1.4–6.5)
Neutrophils Relative %: 61 %
Neutrophils Relative %: 58 %
PLATELETS: 258 10*3/uL (ref 150–440)
Platelets: 217 10*3/uL (ref 150–440)
RBC: 4.81 MIL/uL (ref 4.40–5.90)
RBC: 4.8 MIL/uL (ref 4.40–5.90)
RDW: 13.4 % (ref 11.5–14.5)
RDW: 13.6 % (ref 11.5–14.5)
WBC: 4.4 10*3/uL (ref 3.8–10.6)
WBC: 6.7 10*3/uL (ref 3.8–10.6)

## 2016-05-27 LAB — VALPROIC ACID LEVEL: Valproic Acid Lvl: <10 ug/mL — ABNORMAL LOW (ref 50.0–100.0)

## 2016-05-27 MED ORDER — ACETAMINOPHEN 325 MG PO TABS
650.0000 mg | ORAL_TABLET | Freq: Four times a day (QID) | ORAL | Status: DC | PRN
  Filled 2016-05-27 (×2): qty 2

## 2016-05-27 MED ORDER — DIVALPROEX SODIUM 500 MG PO DR TAB: 1000.0000 mg | DELAYED_RELEASE_TABLET | ORAL | Status: DC

## 2016-05-27 MED ORDER — HYDROXYZINE HCL 25 MG PO TABS
25.0000 mg | ORAL_TABLET | Freq: Three times a day (TID) | ORAL | Status: DC | PRN
  Administered 2016-05-27 – 2016-05-28 (×2): 25 mg via ORAL
  Filled 2016-05-27 (×2): qty 1

## 2016-05-27 MED ORDER — SIMVASTATIN 40 MG PO TABS
40.0000 mg | ORAL_TABLET | Freq: Every day | ORAL | Status: DC
  Administered 2016-05-27 – 2016-07-09 (×44): 40 mg via ORAL
  Filled 2016-05-27 (×44): qty 1

## 2016-05-27 MED ORDER — DIVALPROEX SODIUM 500 MG PO DR TAB
1000.0000 mg | DELAYED_RELEASE_TABLET | ORAL | Status: DC
  Administered 2016-05-28 – 2016-06-10 (×14): 1000 mg via ORAL
  Filled 2016-05-27 (×15): qty 2

## 2016-05-27 MED ORDER — DIVALPROEX SODIUM 500 MG PO DR TAB: 1500.0000 mg | DELAYED_RELEASE_TABLET | Freq: Every day | ORAL | Status: DC

## 2016-05-27 MED ORDER — CLOZAPINE 100 MG PO TABS
400.0000 mg | ORAL_TABLET | Freq: Every day | ORAL | Status: DC
  Administered 2016-05-27 – 2016-07-09 (×44): 400 mg via ORAL
  Filled 2016-05-27 (×44): qty 4

## 2016-05-27 MED ORDER — SENNA 8.6 MG PO TABS
2.0000 | ORAL_TABLET | Freq: Every day | ORAL | Status: DC
  Administered 2016-05-27 – 2016-07-09 (×44): 17.2 mg via ORAL
  Filled 2016-05-27 (×44): qty 2

## 2016-05-27 MED ORDER — TRAZODONE HCL 100 MG PO TABS
200.0000 mg | ORAL_TABLET | Freq: Every day | ORAL | Status: DC
  Administered 2016-05-27 – 2016-06-23 (×27): 200 mg via ORAL
  Filled 2016-05-27 (×28): qty 2

## 2016-05-27 MED ORDER — DIVALPROEX SODIUM 500 MG PO DR TAB
1500.0000 mg | DELAYED_RELEASE_TABLET | Freq: Every day | ORAL | Status: DC
  Administered 2016-05-27 – 2016-06-09 (×14): 1500 mg via ORAL
  Filled 2016-05-27 (×13): qty 3

## 2016-05-27 MED ORDER — MAGNESIUM HYDROXIDE 400 MG/5ML PO SUSP
30.0000 mL | Freq: Every day | ORAL | Status: DC | PRN
  Administered 2016-06-23 – 2016-07-06 (×5): 30 mL via ORAL
  Filled 2016-05-27 (×5): qty 30

## 2016-05-27 MED ORDER — CLOZAPINE 100 MG PO TABS
100.0000 mg | ORAL_TABLET | Freq: Every day | ORAL | Status: DC
  Administered 2016-05-28 – 2016-07-10 (×44): 100 mg via ORAL
  Filled 2016-05-27 (×45): qty 1

## 2016-05-27 MED ORDER — ALUM & MAG HYDROXIDE-SIMETH 200-200-20 MG/5ML PO SUSP
30.0000 mL | ORAL | Status: DC | PRN
  Filled 2016-05-27: qty 30

## 2016-05-27 NOTE — ED Notes (Signed)
Currently pacing Dayroom and responding to internal stimuli.

## 2016-05-27 NOTE — ED Notes (Signed)
Pt had been sleeping to time of lab draw. He did awake to the sound Of NTs voice and he allowed her to draw blood without incicdent. He then became more guarded and briefly answered assessment questions. Continues to endorse AH, but denies VH and SI/HI. He appears to be responding to internal stimuli. Writer offered food, fluids and toiletries but pt asked Clinical research associatewriter to leave him alone. Writer reminded pt of his presence for the shift and encouraged him to let me know if he needs anything additional. Safety has been maintained with q15 minute checks, hourly rounding and ODS obs. Will continue current POC pending disposition.
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
## 2016-05-27 NOTE — Plan of Care (Signed)
Problem: Activity: Goal: Risk for activity intolerance will decrease Outcome: Progressing Impaired thought processes prevents Patient from following health promotion suggestions.

## 2016-05-27 NOTE — ED Notes (Signed)
PT IVC/ PENDING PLACEMENT  
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
## 2016-05-27 NOTE — B[facility] Counselor (Signed)
Pt accepted to [facility] inpatient room [facility] per Kingsley CallanderJanet charge RN. Accepting provider Dr. Juluis MirePuchelowska, Can come at any time  [facility], [place]

## 2016-05-27 NOTE — ED Notes (Signed)
ED charge notified of need for labs to be drawn

## 2016-05-27 NOTE — ED Provider Notes (Signed)
-----------------------------------------   11:49 AM on 05/27/2016 -----------------------------------------   Blood pressure (!) 161/92, pulse (!) 106, temperature 97.8 F (36.6 C), temperature source Oral, resp. rate 20, height 5\' 8"  (1.727 m), weight 199 lb (90.3 kg), SpO2 97 %.  The patient had no acute events since last update.  Calm and cooperative at this time.  Disposition is pending Psychiatry/Behavioral Medicine team recommendations.     Jeanmarie PlantMcShane, James A, MD 05/27/16 1149
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
## 2016-05-27 NOTE — ED Notes (Signed)
Snack provided

## 2016-05-27 NOTE — Progress Notes (Addendum)
D: Pt was noted on the floor, when asked what happened he could not really explain. Upon assessment, no injuries noted ROME x 4, denies pain or discomfort, patient however got agitated when staff tried to get his vital signs and he refused. The security camera  Was accessed to see the chain of events and via camera patient was noted to have fallen  on the floor, with no head injury. Patient denies SI/HI/AVH, but noted responding to internal stimuli, patient's thoughts are disorganized and he appears impulsive and anxious. Patient was frequently redirected for safety. MD notified of incident, no new orders received, will continue to closely monitor.

## 2016-05-27 NOTE — Progress Notes (Signed)
MEDICATION RELATED CONSULT NOTE - FOLLOW UP   Pharmacy Consult for Clozapine Monitoring  Indication: Schizophrenia  No Known Allergies  Patient Measurements: Height: 5\' 8"  (172.7 cm) Weight: 199 lb (90.3 kg) IBW/kg (Calculated) : 68.4   Vital Signs: Temp: 97.8 F (36.6 C) (05/07 0635) Temp Source: Oral (05/07 0635) BP: 161/92 (05/07 0635) Pulse Rate: 106 (05/07 0635)  Labs:  Recent Labs  05/26/16 1725 05/27/16 0925  WBC 10.0 4.4  HGB 14.7 14.2  HCT 41.7 41.3  PLT 259 217  CREATININE 0.76 0.75  ALBUMIN 4.3  --   PROT 7.2  --   AST 23  --   ALT 19  --   ALKPHOS 53  --   BILITOT 0.7  --    Estimated Creatinine Clearance: 115.3 mL/min (by C-G formula based on SCr of 0.75 mg/dL).     Plan:  Patient registered and eligible with [facility] under Dr. Toni Amendlapacs  [id number] [phone]  Next ANC due 06/03/16 while hospitalized  05/27/16  ANC 2700  [place]aldron LabsKaren K Nochum Fenter, RPh 05/27/2016,9:59 AM

## 2016-05-27 NOTE — ED Notes (Signed)
Pt has been accepted to [facility]. [facility], Charity fundraiserN. Pts belongings will be sent to BMU with the pt. He will be transported by [employer] enforcement r/t IVC status

## 2016-05-27 NOTE — Progress Notes (Signed)
Patient was violent when asked to lay for an EKG. Patient refused EKG RN notified

## 2016-05-28 ENCOUNTER — Encounter: Payer: Self-pay | Admitting: Psychiatry

## 2016-05-28 DIAGNOSIS — F203 Undifferentiated schizophrenia: Principal | ICD-10-CM

## 2016-05-28 LAB — LIPID PANEL
Cholesterol: 139 mg/dL (ref 0–200)
HDL: 43 mg/dL (ref >40)
LDL CALC: 79 mg/dL (ref 0–99)
TRIGLYCERIDES: 84 mg/dL (ref <150)
Total CHOL/HDL Ratio: 3.2 RATIO
VLDL: 17 mg/dL (ref 0–40)

## 2016-05-28 LAB — TSH: TSH: 1.225 u[IU]/mL (ref 0.350–4.500)

## 2016-05-28 MED ORDER — CHLORPROMAZINE HCL 100 MG PO TABS
50.0000 mg | ORAL_TABLET | Freq: Three times a day (TID) | ORAL | Status: DC | PRN
  Administered 2016-05-28 – 2016-07-01 (×17): 50 mg via ORAL
  Filled 2016-05-28 (×19): qty 1

## 2016-05-28 MED ORDER — LORAZEPAM 2 MG PO TABS
2.0000 mg | ORAL_TABLET | ORAL | Status: DC | PRN
  Administered 2016-05-28 – 2016-06-30 (×21): 2 mg via ORAL
  Filled 2016-05-28 (×22): qty 1

## 2016-05-28 NOTE — Progress Notes (Signed)
Orders acknowledged for PRN medications for anxiety and agitation. Offered medications to pt, he currently refuses. Pt sitting with legs folded in the floor of hallway, states "nah, I took that a long time ago. Just leave me alone."  Informed pt to let this nurse know if interested in taking the medications. Pt verbalizes understating. Safety maintained with every 15 minute checks. Will continue to monitor.

## 2016-05-28 NOTE — Social Work (Addendum)
CSW contacted [employer] [employer] @ [phone] to speak with patient's guardian, Jeffrey PieriniCassandra [name]. She was unavailable at this time, CSW left voicemail - awaiting return call to gather more information and discuss discharge planning.     Jeffrey [name], MSW, LCSW-A 05/28/2016, 1:58 PM

## 2016-05-28 NOTE — Progress Notes (Signed)
Recreation Therapy Notes  Date: 05.08.18 Time: 9:30 am Location: Craft Room  Group Topic: Goal Setting  Goal Area(s) Addresses:  Patient will identify a personal goal. Patient will identify at least one supportive comment.  Behavioral Response: Did not attend  Intervention: Step By Step  Activity: Patients were given a worksheet with a foot on it. Patients were instructed to write their goal inside the foot and supportive comments outside of the foot.  Education: LRT educated patients on healthy ways they can celebrate reaching their goals.  Education Outcome: Patient did not attend group.  Clinical Observations/Feedback: Patient did not attend group.  Esteban Kobashigawa M, LRT/CTRS 05/28/2016 10:00 AM 

## 2016-05-28 NOTE — [facility] Group Notes (Signed)
[facility] Group Notes:  (Nursing/MHT/Case Management/Adjunct)  Date:  05/28/2016  Time:  6:26 AM  Type of Therapy:  Group Therapy  Participation Level:  Did Not Attend   Veva Holesshley Imani Adilene Areola 05/28/2016, 6:26 AM

## 2016-05-28 NOTE — Progress Notes (Signed)
Pt continues to pace the unit hallways. He is irritable, agitated at times, refuses to talk to staff unless he approaches, but then is very short with his interaction. Pt noted to be less anxious, observed to be less talkative to himself. Continues to respond to internal stimuli, but has settled some since admission yesterday. Pt is medication complaint and even approached nurse multiple times asking if he needs to take medications. PRN medications available if needed. Support and encouragement provided. Medications administered as ordered with education. Safety maintained with every 15 minute checks. Will continue to monitor.
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
## 2016-05-28 NOTE — Progress Notes (Signed)
Patient spent the evening pacing the halls without sitting.  He was approached and redirected multiple times and was witnessed responding to unseen others.  He then demanded medications at 2000 "so I can get some rest, dammit".  He took his medications and has frequently asked for cold water which was given to him.  He was observed getting in and out of bed multiple times and was directed to try to rest.  He lost his footing once when rapidly getting up from reclining. He went down on one knee.  Staff attempted to assess and he demanded to be left alone.  He refused vital signs.  Dr Toni Amendlapacs was notified.  At this time he continues to be in and out of his bed but is following frequent redirection to try to rest.

## 2016-05-28 NOTE — [facility] Suicide Risk Assessment (Signed)
[facility] Admission Suicide Risk Assessment   Nursing information obtained from:  Patient Demographic factors:  Male, Caucasian, Living alone, Unemployed Current Mental Status:  NA Loss Factors:  Loss of significant relationship, Legal issues Historical Factors:  Prior suicide attempts, Family history of mental illness or substance abuse, Domestic violence Risk Reduction Factors:  NA  Total Time spent with patient: 1 hour Principal Problem: Schizophrenia, undifferentiated (HCC) Diagnosis:   Patient Active Problem List   Diagnosis Date Noted  . Dyslipidemia [E78.5] 05/27/2016  . Constipation [K59.00] 05/27/2016  . Schizophrenia, undifferentiated (HCC) [F20.3] 05/27/2016  . Noncompliance [Z91.19] 02/15/2016  . 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 Schizophrenia (HCC) [F20.9] 11/08/2014  . Tobacco use disorder [F17.200] 06/30/2014  . 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 Hypertension [I10] 06/29/2014  . Hyponatremia [E87.1] 06/29/2014  . Laceration of neck [S11.91XA] 06/29/2014  . Suicidal behavior [R46.89] 06/29/2014   Subjective Data: psychotic break.  Continued Clinical Symptoms:  Alcohol Use Disorder Identification Test Final Score (AUDIT): 0 The "Alcohol Use Disorders Identification Test", Guidelines for Use in Primary Care, Second Edition.  World Science writer [facility]). Score between 0-7:  no or low risk or alcohol related problems. Score between 8-15:  moderate risk of alcohol related problems. Score between 16-19:  high risk of alcohol related problems. Score 20 or above:  warrants further diagnostic evaluation for alcohol dependence and treatment.   CLINICAL FACTORS:   Schizophrenia:   Command hallucinatons Paranoid or undifferentiated type   Musculoskeletal: Strength & Muscle Tone: within normal limits Gait & Station: normal Patient leans: N/A  Psychiatric Specialty Exam: Physical Exam  Nursing note and vitals reviewed. Musculoskeletal: He  exhibits deformity.  Self-amputated right forearm.   Psychiatric: His affect is labile. His speech is rapid and/or pressured and slurred. He is hyperactive and actively hallucinating. Thought content is paranoid and delusional. Cognition and memory are impaired. He expresses impulsivity.    Review of Systems  Psychiatric/Behavioral: Positive for hallucinations and memory loss. The patient has insomnia.   All other systems reviewed and are negative.   Blood pressure 132/89, pulse 98, temperature 98 F (36.7 C), temperature source Oral, resp. rate 18, height 5\' 8"  (1.727 m), weight 80.3 kg (177 lb), SpO2 98 %.Body mass index is 26.91 kg/m.  General Appearance: Casual  Eye Contact:  Good  Speech:  Garbled  Volume:  Normal  Mood:  Anxious  Affect:  Blunt  Thought Process:  Disorganized and Descriptions of Associations: Loose  Orientation:  Full (Time, Place, and Person)  Thought Content:  Delusions, Hallucinations: Auditory and Paranoid Ideation  Suicidal Thoughts:  No  Homicidal Thoughts:  No  Memory:  Immediate;   Poor Recent;   Poor Remote;   Poor  Judgement:  Poor  Insight:  Lacking  Psychomotor Activity:  Increased  Concentration:  Concentration: Poor and Attention Span: Poor  Recall:  Poor  Fund of Knowledge:  Fair  Language:  Fair  Akathisia:  No  Handed:  Left  AIMS (if indicated):     Assets:  Communication Skills Desire for Improvement Financial Resources/Insurance Physical Health Resilience  ADL's:  Impaired  Cognition:  WNL  Sleep:  Number of Hours: 4.45      COGNITIVE FEATURES THAT CONTRIBUTE TO RISK:  None    SUICIDE RISK:   Moderate:  Frequent suicidal ideation with limited intensity, and duration, some specificity in terms of plans, no associated intent, good self-control, limited dysphoria/symptomatology, some risk factors present, and identifiable protective factors, including available and  accessible social support.  PLAN OF CARE: hospital  admission, medication management, discharge planning.  [name] is a 56 year old male with a history of schizophrenia admitted for psychotic break in the context of medication noncompliance and severe social stressors.  1. Psychosis. We restarted Clozapine 100 mg in the morning 400 mg at night for psychosis and Depakote for mood stabilization. VPA level on admission was >10, ANC 3.9.  2. Dyslipidemia. He is on Lipitor.  3. Insomnia. Trazodone is available.  4. Smoking. Nicotine patch is available.  5. Metabolic syndrome monitoring. Lipid panel and TSH are normal. HgbA1C pending.  6. EKG. Normal sinus rhythm, QTc 427.  7. Social. The patient has the guardian at litem, in the process of obtaining guardianship by his ACT team.  8. Disposition. He will be discharged to a group home. He will follow up with ACT team.   I certify that inpatient services furnished can reasonably be expected to improve the patient's condition.   Kristine LineaJolanta Ardean Melroy, MD 05/28/2016, 8:28 AM

## 2016-05-28 NOTE — Progress Notes (Signed)
Patient slept on and off during the night in his street clothes for a total of 4 hours, 45 minutes.  He refused vital signs when offer but finally agreed at 0600. He refused temperature.  He allowed blood work to be done.  He continues to demand that staff not "try to talk to me". He continues to respond to unseen others.

## 2016-05-28 NOTE — Progress Notes (Signed)
Recreation Therapy Notes  At approximately 1:15 pm, LRT attempted assessment. Patient was sleeping.  Jeffrey [name],Jeffrey [name], LRT/CTRS 05/28/2016 1:49 PM

## 2016-05-28 NOTE — BHH Counselor (Signed)
Adult Comprehensive Assessment  Patient ID: Jeffrey [name], male   DOB: 1960/09/04, 56 y.o.   MRN: [id number]  Information Source: Information source:  (Pt refused to participate. Information was gathered per chart)  Current Stressors:  Educational / Learning stressors: None reported  Employment / Job issues: Pt recieves SSDI Family Relationships: Pt lives in mother's home.  Housing / Lack of housing: Patient will have to be placed in supervised housing due to inability to care for himself. Physical health (include injuries & life threatening diseases): None reported  Social relationships: None reported  Substance abuse: None reported  Bereavement / Loss: None reported   Living/Environment/Situation:  Living Arrangements: Mother's home. Living conditions (as described by patient or guardian): Unable to care for himself.  How long has patient lived in current situation?: His entire life.  What is atmosphere in current home: Unable to care for himself at this time.  Family History:  Marital status: Single Does patient have children?: No  Childhood History:  By whom was/is the patient raised?: Mother Description of patient's relationship with caregiver when they were a child: Good  Patient's description of current relationship with people who raised him/her: Mother passed away when patient was hospitalized.  Does patient have siblings?: No Did patient suffer any verbal/emotional/physical/sexual abuse as a child?: No Did patient suffer from severe childhood neglect?: No Has patient ever been sexually abused/assaulted/raped as an adolescent or adult?: No Was the patient ever a victim of a crime or a disaster?: No Witnessed domestic violence?: No Has patient been effected by domestic violence as an adult?: No  Education:  Highest grade of school patient has completed: high school  Currently a Consulting civil engineer?: No Learning disability?: No  Employment/Work Situation:   Employment  situation: On disability Why is patient on disability: Schizophrenia  How long has patient been on disability: Many years  Patient's job has been impacted by current illness: No What is the longest time patient has a held a job?: NA Where was the patient employed at that time?: NA  Has patient ever been in the [employer]?: No  Financial Resources:   Surveyor, quantity resources: Insurance claims handler Does patient have a Lawyer or guardian?: Yes Name of representative payee or guardian: Guardian ad litam   Alcohol/Substance Abuse:   What has been your use of drugs/alcohol within the last 12 months?: Pt denies  Alcohol/Substance Abuse Treatment Hx: Denies past history Has alcohol/substance abuse ever caused legal problems?: No  Social Support System:   Patient's Community Support System: Good Describe Community Support System: ACT Team  Type of faith/religion: Christianity  How does patient's faith help to cope with current illness?: Comfort   Leisure/Recreation:   Leisure and Hobbies: walking around, watching tv   Strengths/Needs:   In what areas does patient struggle / problems for patient: Non compliance on medications, aggression   Discharge Plan:   Does patient have access to transportation?: Yes Will patient be returning to same living situation after discharge?: Yes Currently receiving community mental health services: Yes (From Whom) ([facility] [place] ) Does patient have financial barriers related to discharge medications?: No  Summary/Recommendations:   Jeffrey [name] is a 56 year old male who presented to [facility] after [facility] found patient in his home delusional and psychotic. Pt refused to participate. Information was obtain from chart and pt's ACT Tea, and Guardian. ACT Team states that patient has not been taking his medications for a while and has shown signs of aggressive behaviors. Patient's mother  who was his only support has passed away and since then, patient  has been living in his mother's home alone. Patient is unable to care for himself, ACT Team, guardian, and CSW are looking for other options at this time. [facility] and will continue those services at discharge. Recommendations include crisis stabilization, medication management, therapeutic milieu and encourage group attendance and participation.   Jeffrey [name], MSW, LCSW-A 05/28/2016, 1:44PM

## 2016-05-28 NOTE — [facility] Suicide Risk Assessment (Signed)
[facility] INPATIENT:  Family/Significant Other Suicide Prevention Education  Suicide Prevention Education:  Education Completed; patient's guardian, Morey HummingbirdCassandra Massenburg ph#: [phone] has been identified by the patient as the family member/significant other with whom the patient will be residing, and identified as the person(s) who will aid the patient in the event of a mental health crisis (suicidal ideations/suicide attempt).  With written consent from the patient, the family member/significant other has been provided the following suicide prevention education, prior to the and/or following the discharge of the patient.   CSW spoke with patient's legal guardian who stated that patient is unable to care for himself after the death of his mother. Guardian stated that patient found mother in home and drove himself to the ED to report her death after his release from a hospital stay. [facility] was then [facility] due to his bizarre behaviors and discharged again. Unfortunately, at discharge - [facility] was unable to find him. [place] and [facility]. Guardian stated that patient's SSDI funds are still going to deceased mother's account which the guardian is working to have access to in order to place patient in supervised/licensed housing.   [facility] is also working with this CSW and guardian to find placement for patient.  The suicide prevention education provided includes the following:  Suicide risk factors  Suicide prevention and interventions  National Suicide Hotline telephone number  [facility] assessment telephone number  [facility] Emergency Assistance 911  [facility] and/or Residential Mobile Crisis Unit telephone number  Request made of family/significant other to:  Remove weapons (e.g., guns, rifles, knives), all items previously/currently identified as safety concern.     Remove drugs/medications (over-the-counter, prescriptions, illicit drugs), all items previously/currently identified as a safety concern.  The family member/significant other verbalizes understanding of the suicide prevention education information provided.  The family member/significant other agrees to remove the items of safety concern listed above.   Lynden OxfordKadijah R Rithy Mandley, MSW, LCSW-A 05/28/2016, 3:17 PM

## 2016-05-28 NOTE — Plan of Care (Signed)
Problem: Coping: Goal: Ability to verbalize feelings will improve Outcome: Not Progressing Pt does not wish to carry on conversation with anyone, approaches staff, but is short with interactions-becomes agitated, irritated. .Marland Kitchen

## 2016-05-28 NOTE — Progress Notes (Signed)
Pt approached this nurse requesting to wash clothes. Pt then verbalized that "I need my shoes and my [place] to wash." Pt informed that he only came to hospital with the clothes/shoes he is currently wearing. Provided belongings sheet for him to see that he signed off and wash handed over all his belongings on admission to [facility]. Pt agitated, jumps up from floor where he was sitting and walks off, mumbling "I'm going to bust you." Safety maintained. Will continue to monitor.

## 2016-05-29 LAB — HEMOGLOBIN A1C
Hgb A1c MFr Bld: 5.6 % (ref 4.8–5.6)
Mean Plasma Glucose: 114 mg/dL

## 2016-05-29 NOTE — Progress Notes (Signed)
[facility] MD Progress Note  05/29/2016 3:31 PM Jeffrey [name]  MRN:  [id number]  Subjective:   05/29/2016. Jeffrey [name] met with treatment team today but was unable to participate. He was actively hallucinating mumbling to himself and unable to answer any questions. He refused to sign hour form acknowledging participation. He has been coping with medications. He has been less restless and agitated this morning. Last night he received a dose of when necessary Ativan and Thorazine and slept for several hours. He slept well with Trazodone last night.  Per nursing: Pt at the time of assessment was flat and isolative; remained bed resting with eyes closed. Pt at the time denied depression, anxiety, pain, SI, HI and AVH by shaking his head from side to side when asked about each. Pt did not look to be in any distress. Medications offered as prescribed. Pt was med compliant. Pt did not attend wrap-up group. 15-minute safety checks continue.  Principal Problem: Schizophrenia, undifferentiated ([place]) Diagnosis:   Patient Active Problem List   Diagnosis Date Noted  . Dyslipidemia [E78.5] 05/27/2016  . Constipation [K59.00] 05/27/2016  . Schizophrenia, undifferentiated ([place]) [F20.3] 05/27/2016  . Noncompliance [Z91.19] 02/15/2016  . Disorganized schizophrenia ([place]) [F20.1]   . Schizophrenia ([place]) [F20.9] 11/08/2014  . Tobacco use disorder [F17.200] 06/30/2014  . Paranoid schizophrenia ([place]) [F20.0] 06/30/2014  . Hypertension [I10] 06/29/2014  . Hyponatremia [E87.1] 06/29/2014  . Laceration of neck [S11.91XA] 06/29/2014  . Suicidal behavior [R46.89] 06/29/2014   Total Time spent with patient: 20 minutes  Past Psychiatric History: schizophrenia.  Past Medical History:  Past Medical History:  Diagnosis Date  . Hypertension   . Schizophrenia [facility])     Past Surgical History:  Procedure Laterality Date  . 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 No pertinent family history. Family Psychiatric  History: none reported. Social History:  History  Alcohol Use No     History  Drug Use No    Social History   Social History  . Marital status: Single    Spouse name: N/A  . Number of children: N/A  . Years of education: N/A   Social History Main Topics  . Smoking status: Current Every Day Smoker    Packs/day: 1.00    Types: Cigarettes  . Smokeless tobacco: Current User  . Alcohol use No  . Drug use: No  . Sexual activity: Not Asked   Other Topics Concern  . None   Social History Narrative  . None   Additional Social History:    History of alcohol / drug use?: No history of alcohol / drug abuse                    Sleep: Poor  Appetite:  Fair  Current Medications: Current Facility-Administered Medications  Medication Dose Route Frequency Provider Last Rate Last Dose  . acetaminophen (TYLENOL) tablet 650 mg  650 mg Oral Q6H PRN Clapacs, John T, MD      . alum & mag hydroxide-simeth (MAALOX/MYLANTA) 200-200-20 MG/5ML suspension 30 mL  30 mL Oral Q4H PRN Clapacs, John T, MD      . chlorproMAZINE (THORAZINE) tablet 50 mg  50 mg Oral TID PRN Pucilowska, [name], MD   50 mg at 05/29/16 1045  . cloZAPine (CLOZARIL) tablet 100 mg  100 mg Oral Daily Clapacs, John T, MD   100 mg at 05/29/16 1047  . cloZAPine (CLOZARIL)  tablet 400 mg  400 mg Oral QHS Clapacs, Madie Reno, MD   400 mg at 05/29/16 0018  . divalproex (DEPAKOTE) DR tablet 1,000 mg  1,000 mg Oral BH-q7a Clapacs, Madie Reno, MD   1,000 mg at 05/29/16 0733  . divalproex (DEPAKOTE) DR tablet 1,500 mg  1,500 mg Oral QHS Clapacs, John T, MD   1,500 mg at 05/29/16 0019  . LORazepam (ATIVAN) tablet 2 mg  2 mg Oral Q4H PRN Pucilowska, [name], MD   2 mg at 05/29/16 1046  . magnesium hydroxide (MILK OF MAGNESIA) suspension 30 mL  30 mL Oral Daily PRN Clapacs, John T, MD      . senna (SENOKOT) tablet 17.2 mg  2 tablet Oral QHS Clapacs, John T, MD   17.2 mg at  05/29/16 0019  . simvastatin (ZOCOR) tablet 40 mg  40 mg Oral QHS Clapacs, Madie Reno, MD   40 mg at 05/29/16 0019  . traZODone (DESYREL) tablet 200 mg  200 mg Oral QHS Clapacs, Madie Reno, MD   200 mg at 05/29/16 0019    Lab Results:  Results for orders placed or performed during the hospital encounter of 05/27/16 (from the past 48 hour(s))  CBC with Differential/Platelet     Status: None   Collection Time: 05/27/16  5:42 PM  Result Value Ref Range   WBC 6.7 3.8 - 10.6 K/uL   RBC 4.80 4.40 - 5.90 MIL/uL   Hemoglobin 14.4 13.0 - 18.0 g/dL   HCT 41.2 40.0 - 52.0 %   MCV 85.9 80.0 - 100.0 fL   MCH 30.0 26.0 - 34.0 pg   MCHC 34.9 32.0 - 36.0 g/dL   RDW 13.6 11.5 - 14.5 %   Platelets 258 150 - 440 K/uL   Neutrophils Relative % 58 %   Neutro Abs 3.9 1.4 - 6.5 K/uL   Lymphocytes Relative 28 %   Lymphs Abs 1.9 1.0 - 3.6 K/uL   Monocytes Relative 13 %   Monocytes Absolute 0.9 0.2 - 1.0 K/uL   Eosinophils Relative 0 %   Eosinophils Absolute 0.0 0 - 0.7 K/uL   Basophils Relative 1 %   Basophils Absolute 0.1 0 - 0.1 K/uL  Valproic acid level     Status: Abnormal   Collection Time: 05/27/16  5:42 PM  Result Value Ref Range   Valproic Acid Lvl <10 (L) 50.0 - 100.0 ug/mL  Hemoglobin A1c     Status: None   Collection Time: 05/28/16  6:32 AM  Result Value Ref Range   Hgb A1c MFr Bld 5.6 4.8 - 5.6 %    Comment: (NOTE)         Pre-diabetes: 5.7 - 6.4         Diabetes: >6.4         Glycemic control for adults with diabetes: <7.0    Mean Plasma Glucose 114 mg/dL    Comment: (NOTE) Performed At: [facility] [place], [place] [id number] Lindon Romp MD KA:[id number]   Lipid panel     Status: None   Collection Time: 05/28/16  6:32 AM  Result Value Ref Range   Cholesterol 139 0 - 200 mg/dL   Triglycerides 84 <150 mg/dL   HDL 43 >40 mg/dL   Total CHOL/HDL Ratio 3.2 RATIO   VLDL 17 0 - 40 mg/dL   LDL Cholesterol 79 0 - 99 mg/dL    Comment:        Total  Cholesterol/HDL:CHD Risk Coronary  Heart Disease Risk Table                     Men   Women  1/2 Average Risk   3.4   3.3  Average Risk       5.0   4.4  2 X Average Risk   9.6   7.1  3 X Average Risk  23.4   11.0        Use the calculated Patient Ratio above and the CHD Risk Table to determine the patient's CHD Risk.        ATP III CLASSIFICATION (LDL):  <100     mg/dL   Optimal  100-129  mg/dL   Near or Above                    Optimal  130-159  mg/dL   Borderline  160-189  mg/dL   High  >190     mg/dL   Very High   TSH     Status: None   Collection Time: 05/28/16  6:32 AM  Result Value Ref Range   TSH 1.225 0.350 - 4.500 uIU/mL    Comment: Performed by a 3rd Generation assay with a functional sensitivity of <=0.01 uIU/mL.    Blood Alcohol level:  Lab Results  Component Value Date   ETH <5 05/26/2016   ETH <5 50/53/9767    Metabolic Disorder Labs: Lab Results  Component Value Date   HGBA1C 5.6 05/28/2016   MPG 114 05/28/2016   No results found for: PROLACTIN Lab Results  Component Value Date   CHOL 139 05/28/2016   TRIG 84 05/28/2016   HDL 43 05/28/2016   CHOLHDL 3.2 05/28/2016   VLDL 17 05/28/2016   LDLCALC 79 05/28/2016   LDLCALC 74 11/08/2014    Physical Findings: AIMS: Facial and Oral Movements Muscles of Facial Expression: None, normal Lips and Perioral Area: None, normal Jaw: None, normal Tongue: None, normal,Extremity Movements Upper (arms, wrists, hands, fingers): Minimal Lower (legs, knees, ankles, toes): Minimal, Trunk Movements Neck, shoulders, hips: None, normal, Overall Severity Severity of abnormal movements (highest score from questions above): Minimal Incapacitation due to abnormal movements: None, normal Patient's awareness of abnormal movements (rate only patient's report): No Awareness, Dental Status Current problems with teeth and/or dentures?: Yes (poor hygiene, teeth missing) Does patient usually wear dentures?: No  CIWA:     COWS:     Musculoskeletal: Strength & Muscle Tone: within normal limits Gait & Station: normal Patient leans: N/A  Psychiatric Specialty Exam: Physical Exam  Nursing note and vitals reviewed. Musculoskeletal: He exhibits deformity.  Psychiatric: His mood appears anxious. His affect is inappropriate. He is hyperactive and actively hallucinating. Thought content is paranoid and delusional. Cognition and memory are impaired. He expresses impulsivity. He is noncommunicative.    Review of Systems  Psychiatric/Behavioral: Positive for depression and hallucinations. The patient is nervous/anxious.   All other systems reviewed and are negative.   Blood pressure 134/86, pulse (!) 102, temperature 98 F (36.7 C), resp. rate 18, height 5' 8"  (1.727 m), weight 80.3 kg (177 lb), SpO2 98 %.Body mass index is 26.91 kg/m.  General Appearance: Fairly Groomed  Eye Contact:  Minimal  Speech:  Garbled  Volume:  Normal  Mood:  Anxious and Dysphoric  Affect:  Inappropriate and Labile  Thought Process:  Disorganized and Descriptions of Associations: Loose  Orientation:  Other:  unable to assess  Thought Content:  Delusions, Hallucinations: Auditory and Paranoid  Ideation  Suicidal Thoughts:  No  Homicidal Thoughts:  No  Memory:  Immediate;   Poor Recent;   Poor Remote;   Poor  Judgement:  Impaired  Insight:  Lacking  Psychomotor Activity:  Increased  Concentration:  Concentration: Poor and Attention Span: Poor  Recall:  Poor  Fund of Knowledge:  Poor  Language:  Poor  Akathisia:  No  Handed:  Right  AIMS (if indicated):     Assets:  Communication Skills Desire for Improvement Financial Resources/Insurance Physical Health Resilience  ADL's:  Intact  Cognition:  WNL  Sleep:  Number of Hours: 4.45     Treatment Plan Summary: Daily contact with patient to assess and evaluate symptoms and progress in treatment and Medication management   Jeffrey [name] is a 56 year old male with a history  of schizophrenia admitted for psychotic break in the context of medication noncompliance and severe social stressors.  1. Psychosis. We restarted Clozapine 100 mg in the morning 400 mg at night for psychosis and Depakote for mood stabilization. VPA level on admission was >10, ANC 3.9.  2. Dyslipidemia. He is on Lipitor.  3. Insomnia. Trazodone is available.  4. Smoking. Nicotine patch is available.  5. Metabolic syndrome monitoring. Lipid panel and TSH are normal. HgbA1C pending.  6. EKG. Normal sinus rhythm, QTc 427.  7. Social. The patient has the guardian at litem, in the process of obtaining guardianship by his ACT team.  8. Agitation. We will add Ativan and Thorazine.   9. Disposition. He will be discharged to a group home. He will follow up with ACT team.      Orson Slick, MD 05/29/2016, 3:31 PM

## 2016-05-29 NOTE — [facility] Group Notes (Signed)
[facility] Group Notes:  (Nursing/MHT/Case Management/Adjunct)  Date:  05/29/2016  Time:  4:12 AM  Type of Therapy:  Psychoeducational Skills  Participation Level:  Did Not Attend  Summary of Progress/Problems:  Jeffrey MilroyLaquanda Y Azalynn Maxim 05/29/2016, 4:12 AM

## 2016-05-29 NOTE — Progress Notes (Signed)
Recreation Therapy Notes  At approximately 1:55 pm, LRT attempted assessment. Patient was sleeping.  Jacquelynn CreeGreene,Honor Fairbank M, LRT/CTRS 05/29/2016 2:51 PM

## 2016-05-29 NOTE — Progress Notes (Signed)
DAR Note  Pt at the time of assessment was flat and isolative; remained bed resting with eyes closed. Pt at the time denied depression, anxiety, pain, SI, HI and AVH by shaking his head from side to side when asked about each. Pt did not look to be in any distress. Medications offered as prescribed. Pt was med compliant. Pt did not attend wrap-up group. 15-minute safety checks continue.

## 2016-05-29 NOTE — Progress Notes (Signed)
Patient paces halls and mumbles to himself.  When engaged patient is tangential, told this Clinical research associatewriter about a Engineer, siteschool teacher.  No group participation.  Medicated x1 for anxiety and agitation. Scheduled medications given.  Support offered.  Safety checks maintained.

## 2016-05-29 NOTE — [facility] Group Notes (Signed)
[facility] LCSW Group Therapy   05/29/2016  1:00 pm   Type of Therapy: Group Therapy   Participation Level: Patient unable to participate in programming.   Jeffrey [name], MSW, LCSWA 05/29/2016, 1:41PM

## 2016-05-29 NOTE — Tx Team (Signed)
Interdisciplinary Treatment and Diagnostic Plan Update  05/29/2016 Time of Session: 10:30 AM Jeffrey [name] MRN: [id number]  Principal Diagnosis: Schizophrenia, undifferentiated (HCC)  Secondary Diagnoses: Principal Problem:   Schizophrenia, undifferentiated (HCC) Active Problems:   Tobacco use disorder   Noncompliance   Dyslipidemia   Constipation   Current Medications:  Current Facility-Administered Medications  Medication Dose Route Frequency Provider Last Rate Last Dose  . acetaminophen (TYLENOL) tablet 650 mg  650 mg Oral Q6H PRN Clapacs, John T, MD      . alum & mag hydroxide-simeth (MAALOX/MYLANTA) 200-200-20 MG/5ML suspension 30 mL  30 mL Oral Q4H PRN Clapacs, John T, MD      . chlorproMAZINE (THORAZINE) tablet 50 mg  50 mg Oral TID PRN Pucilowska, [name], MD   50 mg at 05/29/16 1045  . cloZAPine (CLOZARIL) tablet 100 mg  100 mg Oral Daily Clapacs, John T, MD   100 mg at 05/29/16 1047  . cloZAPine (CLOZARIL) tablet 400 mg  400 mg Oral QHS Clapacs, John T, MD   400 mg at 05/29/16 0018  . divalproex (DEPAKOTE) DR tablet 1,000 mg  1,000 mg Oral BH-q7a Clapacs, [name], MD   1,000 mg at 05/29/16 0733  . divalproex (DEPAKOTE) DR tablet 1,500 mg  1,500 mg Oral QHS Clapacs, John T, MD   1,500 mg at 05/29/16 0019  . LORazepam (ATIVAN) tablet 2 mg  2 mg Oral Q4H PRN Pucilowska, [name], MD   2 mg at 05/29/16 1046  . magnesium hydroxide (MILK OF MAGNESIA) suspension 30 mL  30 mL Oral Daily PRN Clapacs, John T, MD      . senna (SENOKOT) tablet 17.2 mg  2 tablet Oral QHS Clapacs, John T, MD   17.2 mg at 05/29/16 0019  . simvastatin (ZOCOR) tablet 40 mg  40 mg Oral QHS Clapacs, [name], MD   40 mg at 05/29/16 0019  . traZODone (DESYREL) tablet 200 mg  200 mg Oral QHS Clapacs, John T, MD   200 mg at 05/29/16 0019   PTA Medications: Prescriptions Prior to Admission  Medication Sig Dispense Refill Last Dose  . cloZAPine (CLOZARIL) 100 MG tablet Take 1-4 tablets (100-400 mg total) by  mouth 2 (two) times daily. Patient takes 1 tablet (100 mg) in the morning and 4 tablets (400 mg) at bedtime. (Patient not taking: Reported on 04/26/2016) 150 tablet 0 Not Taking at Unknown time  . paliperidone (INVEGA) 6 MG 24 hr tablet Take 6 mg by mouth 2 (two) times daily.   Not Taking at Unknown time  . senna (SENOKOT) 8.6 MG TABS tablet Take 2 tablets (17.2 mg total) by mouth at bedtime. (Patient not taking: Reported on 04/26/2016) 120 each 0 Not Taking at Unknown time  . simvastatin (ZOCOR) 40 MG tablet Take 40 mg by mouth at bedtime.   Not Taking at Unknown time  . traZODone (DESYREL) 100 MG tablet Take 200 mg by mouth at bedtime.   Not Taking at Unknown time    Patient Stressors: Marital or family conflict Medication change or noncompliance Traumatic event  Patient Strengths: Astronomer fund of knowledge Physical Health  Treatment Modalities: Medication Management, Group therapy, Case management,  1 to 1 session with clinician, Psychoeducation, Recreational therapy.   Physician Treatment Plan for Primary Diagnosis: Schizophrenia, undifferentiated (HCC) Long Term Goal(s): Improvement in symptoms so as ready for discharge NA   Short Term Goals: Ability to identify changes in lifestyle to reduce recurrence of condition will improve  Ability to verbalize feelings will improve Ability to disclose and discuss suicidal ideas Ability to demonstrate self-control will improve Ability to identify and develop effective coping behaviors will improve Compliance with prescribed medications will improve Ability to identify triggers associated with substance abuse/mental health issues will improve NA  Medication Management: Evaluate patient's response, side effects, and tolerance of medication regimen.  Therapeutic Interventions: 1 to 1 sessions, Unit Group sessions and Medication administration.  Evaluation of Outcomes: Progressing  Physician Treatment Plan for Secondary  Diagnosis: Principal Problem:   Schizophrenia, undifferentiated (HCC) Active Problems:   Tobacco use disorder   Noncompliance   Dyslipidemia   Constipation  Long Term Goal(s): Improvement in symptoms so as ready for discharge NA   Short Term Goals: Ability to identify changes in lifestyle to reduce recurrence of condition will improve Ability to verbalize feelings will improve Ability to disclose and discuss suicidal ideas Ability to demonstrate self-control will improve Ability to identify and develop effective coping behaviors will improve Compliance with prescribed medications will improve Ability to identify triggers associated with substance abuse/mental health issues will improve NA     Medication Management: Evaluate patient's response, side effects, and tolerance of medication regimen.  Therapeutic Interventions: 1 to 1 sessions, Unit Group sessions and Medication administration.  Evaluation of Outcomes: Progressing   RN Treatment Plan for Primary Diagnosis: Schizophrenia, undifferentiated (HCC) Long Term Goal(s): Knowledge of disease and therapeutic regimen to maintain health will improve  Short Term Goals: Ability to remain free from injury will improve, Ability to verbalize frustration and anger appropriately will improve, Ability to demonstrate self-control, Ability to participate in decision making will improve and Ability to verbalize feelings will improve  Medication Management: RN will administer medications as ordered by provider, will assess and evaluate patient's response and provide education to patient for prescribed medication. RN will report any adverse and/or side effects to prescribing provider.  Therapeutic Interventions: 1 on 1 counseling sessions, Psychoeducation, Medication administration, Evaluate responses to treatment, Monitor vital signs and CBGs as ordered, Perform/monitor CIWA, COWS, AIMS and Fall Risk screenings as ordered, Perform wound care  treatments as ordered.  Evaluation of Outcomes: Progressing   LCSW Treatment Plan for Primary Diagnosis: Schizophrenia, undifferentiated (HCC) Long Term Goal(s): Safe transition to appropriate next level of care at discharge, Engage patient in therapeutic group addressing interpersonal concerns.  Short Term Goals: Engage patient in aftercare planning with referrals and resources, Increase social support, Increase ability to appropriately verbalize feelings and Increase emotional regulation  Therapeutic Interventions: Assess for all discharge needs, 1 to 1 time with Social worker, Explore available resources and support systems, Assess for adequacy in community support network, Educate family and significant other(s) on suicide prevention, Complete Psychosocial Assessment, Interpersonal group therapy.  Evaluation of Outcomes: Progressing   Progress in Treatment: Attending groups: No. Participating in groups: No. Taking medication as prescribed: Yes. Toleration medication: Yes. Family/Significant other contact made: Yes, individual(s) contacted:  CSW spoke with patient's legal guardian Patient understands diagnosis: Yes. Discussing patient identified problems/goals with staff: Yes. Medical problems stabilized or resolved: Yes. Denies suicidal/homicidal ideation: Yes. Issues/concerns per patient self-inventory: No.  New problem(s) identified: No, Describe:  None identified.  New Short Term/Long Term Goal(s): Patient unable to set goal at this time.  Discharge Plan or Barriers: CSW assessing proper discharge plans at this time.  Reason for Continuation of Hospitalization: Aggression Delusions  Hallucinations  Estimated Length of Stay: 7 days   Attendees: Patient: Jeffrey [name]  05/29/2016 1:53 PM  Physician: Dr. Braulio ConteJolanta  Pucilowska, MD  05/29/2016 1:53 PM  Nursing: Leonia Reader, BSN, RN  05/29/2016 1:53 PM  RN Care Manager: 05/29/2016 1:53 PM  Social Worker: Hampton Abbot, MSW, LCSW-A  05/29/2016 1:53 PM  Recreational Therapist: Princella Ion, LRT, CTRS  05/29/2016 1:53 PM  Other: Daleen Squibb, LCSW 05/29/2016 1:53 PM  Other:  05/29/2016 1:53 PM  Other: 05/29/2016 1:53 PM    Scribe for Treatment Team: Lynden Oxford, LCSWA 05/29/2016 1:53 PM

## 2016-05-29 NOTE — Progress Notes (Signed)
Recreation Therapy Notes  Date: 05.09.18 Time: 9:30 am Location: Craft Room  Group Topic: Self-esteem  Goal Area(s) Addresses:  Patient will write at least one positive trait about self. Patient will verbalize benefit of having a healthy self-esteem.  Behavioral Response: Did not attend  Intervention: I Am  Activity: Patients were given a worksheet with the letter I on it and were instructed to write as many positive traits about themselves inside the letter.  Education: LRT educated patients on ways to increase their self-esteem.  Education Outcome: Patient did not attend group.   Clinical Observations/Feedback: Nursing requested LRT not call patient for group at this time.  Jacquelynn CreeGreene,Haly Feher M, LRT/CTRS 05/29/2016 10:23 AM

## 2016-05-30 NOTE — [facility] Group Notes (Signed)
[facility] Group Notes:  (Nursing/MHT/Case Management/Adjunct)  Date:  05/30/2016  Time:  6:10 PM  Type of Therapy:  Psychoeducational Skills  Participation Level:  Did Not Attend  Lynelle SmokeCara Travis Edmond -Amg Specialty HospitalMadoni 05/30/2016, 6:10 PM

## 2016-05-30 NOTE — Progress Notes (Signed)
Patient visible in the milieu, frequently walking in the hallway. Guarded but states "I am fine, just exercising...getting some energy". Has no concern so far. Denies SI, HI and hallucinations. Safety precautions maintained.

## 2016-05-30 NOTE — [facility] Group Notes (Signed)
[facility] Group Notes:  (Nursing/MHT/Case Management/Adjunct)  Date:  05/30/2016  Time:  10:24 PM  Type of Therapy:  Group Therapy  Participation Level:  Minimal  Participation Quality:  He left early said back was hurting.  Summary of Progress/Problems:  Jeffrey [name] 05/30/2016, 10:24 PM

## 2016-05-30 NOTE — Progress Notes (Signed)
Recreation Therapy Notes  According to nursing notes, patient is not able to answer questions appropriately at this time. LRT will not attempt assessment at this time.  Jacquelynn CreeGreene,Shashwat Cleary M, LRT/CTRS 05/30/2016 3:50 PM

## 2016-05-30 NOTE — Plan of Care (Signed)
Problem: Coping: Goal: Ability to verbalize feelings will improve Outcome: Not Progressing Patient has rambling conversation.  Unable to answer questions appropriately.   Responding to internal stimuli

## 2016-05-30 NOTE — Progress Notes (Signed)
[facility] MD Progress Note  05/30/2016 1:08 PM Jeffrey [name]  MRN:  [id number]  Subjective:   05/29/2016. Jeffrey [name] met with treatment team today but was unable to participate. He was actively hallucinating mumbling to himself and unable to answer any questions. He refused to sign hour form acknowledging participation. He has been coping with medications. He has been less restless and agitated this morning. Last night he received a dose of when necessary Ativan and Thorazine and slept for several hours. He slept well with Trazodone last night.  05/30/2016. Jeffrey [name] is calmer today and sleeps some during the day. Otherwise, he is still pacing and mumbling to himself unable to participate in conversation. Takes medications, eats, sleep at night.  Per nursing: Patient paces halls and mumbles to himself.  When engaged patient is tangential, told this Probation officer about a Education officer, museum.  No group participation.  Medicated x1 for anxiety and agitation. Scheduled medications given.  Support offered.  Safety checks maintained.    Principal Problem: Schizophrenia, undifferentiated ([place]) Diagnosis:   Patient Active Problem List   Diagnosis Date Noted  . Dyslipidemia [E78.5] 05/27/2016  . Constipation [K59.00] 05/27/2016  . Schizophrenia, undifferentiated ([place]) [F20.3] 05/27/2016  . Noncompliance [Z91.19] 02/15/2016  . 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 Schizophrenia ([place]) [F20.9] 11/08/2014  . Tobacco use disorder [F17.200] 06/30/2014  . 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 Hypertension [I10] 06/29/2014  . Hyponatremia [E87.1] 06/29/2014  . Laceration of neck [S11.91XA] 06/29/2014  . Suicidal behavior [R46.89] 06/29/2014   Total Time spent with patient: 20 minutes  Past Psychiatric History: schizophrenia.  Past Medical History:  Past Medical History:  Diagnosis Date  . Hypertension   . Schizophrenia [facility])     Past Surgical History:  Procedure Laterality Date  . 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 No pertinent family history. Family Psychiatric  History: none reported. Social History:  History  Alcohol Use No     History  Drug Use No    Social History   Social History  . Marital status: Single    Spouse name: N/A  . Number of children: N/A  . Years of education: N/A   Social History Main Topics  . Smoking status: Current Every Day Smoker    Packs/day: 1.00    Types: Cigarettes  . Smokeless tobacco: Current User  . Alcohol use No  . Drug use: No  . Sexual activity: Not Asked   Other Topics Concern  . None   Social History Narrative  . None   Additional Social History:    History of alcohol / drug use?: No history of alcohol / drug abuse                    Sleep: Poor  Appetite:  Fair  Current Medications: Current Facility-Administered Medications  Medication Dose Route Frequency Provider Last Rate Last Dose  . acetaminophen (TYLENOL) tablet 650 mg  650 mg Oral Q6H PRN Clapacs, John T, MD      . alum & mag hydroxide-simeth (MAALOX/MYLANTA) 200-200-20 MG/5ML suspension 30 mL  30 mL Oral Q4H PRN Clapacs, John T, MD      . chlorproMAZINE (THORAZINE) tablet 50 mg  50 mg Oral TID PRN Harmonii Karle B, MD   50 mg at 05/30/16 0817  . cloZAPine (CLOZARIL) tablet 100 mg  100 mg Oral Daily Clapacs, Madie Reno, MD   100  mg at 05/30/16 0819  . cloZAPine (CLOZARIL) tablet 400 mg  400 mg Oral QHS Clapacs, Madie Reno, MD   400 mg at 05/29/16 2155  . divalproex (DEPAKOTE) DR tablet 1,000 mg  1,000 mg Oral BH-q7a Clapacs, Madie Reno, MD   1,000 mg at 05/30/16 [phone]  . divalproex (DEPAKOTE) DR tablet 1,500 mg  1,500 mg Oral QHS Clapacs, John T, MD   1,500 mg at 05/29/16 2156  . LORazepam (ATIVAN) tablet 2 mg  2 mg Oral Q4H PRN Kaidynce Pfister B, MD   2 mg at 05/30/16 0817  . magnesium hydroxide (MILK OF MAGNESIA) suspension 30 mL  30 mL Oral Daily PRN Clapacs, John T, MD      .  senna (SENOKOT) tablet 17.2 mg  2 tablet Oral QHS Clapacs, John T, MD   17.2 mg at 05/29/16 2156  . simvastatin (ZOCOR) tablet 40 mg  40 mg Oral QHS Clapacs, Madie Reno, MD   40 mg at 05/29/16 2156  . traZODone (DESYREL) tablet 200 mg  200 mg Oral QHS Clapacs, John T, MD   200 mg at 05/29/16 2156    Lab Results:  No results found for this or any previous visit (from the past 48 hour(s)).  Blood Alcohol level:  Lab Results  Component Value Date   ETH <5 05/26/2016   ETH <5 16/96/7893    Metabolic Disorder Labs: Lab Results  Component Value Date   HGBA1C 5.6 05/28/2016   MPG 114 05/28/2016   No results found for: PROLACTIN Lab Results  Component Value Date   CHOL 139 05/28/2016   TRIG 84 05/28/2016   HDL 43 05/28/2016   CHOLHDL 3.2 05/28/2016   VLDL 17 05/28/2016   LDLCALC 79 05/28/2016   LDLCALC 74 11/08/2014    Physical Findings: AIMS: Facial and Oral Movements Muscles of Facial Expression: None, normal Lips and Perioral Area: None, normal Jaw: None, normal Tongue: None, normal,Extremity Movements Upper (arms, wrists, hands, fingers): Minimal Lower (legs, knees, ankles, toes): Minimal, Trunk Movements Neck, shoulders, hips: None, normal, Overall Severity Severity of abnormal movements (highest score from questions above): Minimal Incapacitation due to abnormal movements: None, normal Patient's awareness of abnormal movements (rate only patient's report): No Awareness, Dental Status Current problems with teeth and/or dentures?: Yes (poor hygiene, teeth missing) Does patient usually wear dentures?: No  CIWA:    COWS:     Musculoskeletal: Strength & Muscle Tone: within normal limits Gait & Station: normal Patient leans: N/A  Psychiatric Specialty Exam: Physical Exam  Nursing note and vitals reviewed. Musculoskeletal: He exhibits deformity.  Psychiatric: His mood appears anxious. His affect is inappropriate. He is hyperactive and actively hallucinating. Thought  content is paranoid and delusional. Cognition and memory are impaired. He expresses impulsivity. He is noncommunicative.    Review of Systems  Psychiatric/Behavioral: Positive for depression and hallucinations. The patient is nervous/anxious.   All other systems reviewed and are negative.   Blood pressure 134/80, pulse 96, temperature 98.2 F (36.8 C), resp. rate 18, height 5' 8"  (1.727 m), weight 80.3 kg (177 lb), SpO2 98 %.Body mass index is 26.91 kg/m.  General Appearance: Fairly Groomed  Eye Contact:  Minimal  Speech:  Garbled  Volume:  Normal  Mood:  Anxious and Dysphoric  Affect:  Inappropriate and Labile  Thought Process:  Disorganized and Descriptions of Associations: Loose  Orientation:  Other:  unable to assess  Thought Content:  Delusions, Hallucinations: Auditory and Paranoid Ideation  Suicidal Thoughts:  No  Homicidal  Thoughts:  No  Memory:  Immediate;   Poor Recent;   Poor Remote;   Poor  Judgement:  Impaired  Insight:  Lacking  Psychomotor Activity:  Increased  Concentration:  Concentration: Poor and Attention Span: Poor  Recall:  Poor  Fund of Knowledge:  Poor  Language:  Poor  Akathisia:  No  Handed:  Right  AIMS (if indicated):     Assets:  Communication Skills Desire for Improvement Financial Resources/Insurance Physical Health Resilience  ADL's:  Intact  Cognition:  WNL  Sleep:  Number of Hours: 7.25     Treatment Plan Summary: Daily contact with patient to assess and evaluate symptoms and progress in treatment and Medication management   Jeffrey [name] is a 56 year old male with a history of schizophrenia admitted for psychotic break in the context of medication noncompliance and severe social stressors.  1. Psychosis. We restarted Clozapine 100 mg in the morning 400 mg at night for psychosis and Depakote for mood stabilization. VPA level on admission was >10, ANC 3.9.  2. Dyslipidemia. He is on Lipitor.  3. Insomnia. Trazodone is  available.  4. Smoking. Nicotine patch is available.  5. Metabolic syndrome monitoring. Lipid panel and TSH are normal. HgbA1C pending.  6. EKG. Normal sinus rhythm, QTc 427.  7. Social. The patient has the guardian at litem, in the process of obtaining guardianship by his ACT team.  8. Agitation. We will add Ativan and Thorazine.   9. Disposition. He will be discharged to a group home. He will follow up with ACT team.      Orson Slick, MD 05/30/2016, 1:08 PM

## 2016-05-30 NOTE — [facility] Group Notes (Signed)
[facility] LCSW Group Therapy Note  Type of Therapy and Topic:  Group Therapy:  Goals Group: SMART Goals  Participation Level:  Did not Attend.   Description of Group:   The purpose of a daily goals group is to assist and guide patients in setting recovery/wellness-related goals.  The objective is to set goals as they relate to the crisis in which they were admitted. Patients will be using SMART goal modalities to set measurable goals.  Characteristics of realistic goals will be discussed and patients will be assisted in setting and processing how one will reach their goal. Facilitator will also assist patients in applying interventions and coping skills learned in psycho-education groups to the SMART goal and process how one will achieve defined goal.  Therapeutic Goals: -Patients will develop and document one goal related to or their crisis in which brought them into treatment. -Patients will be guided by LCSW using SMART goal setting modality in how to set a measurable, attainable, realistic and time sensitive goal.  -Patients will process barriers in reaching goal. -Patients will process interventions in how to overcome and successful in reaching goal.   Summary of Patient Progress:  Patient Goal: None identified at this time, patient did not attend group.   Therapeutic Modalities:   Motivational Interviewing Engineer, manufacturing systemsCognitive Behavioral Therapy Crisis Intervention Model SMART goals setting  Kendrik Mcshan G. Garnette CzechSampson MSW, LCSWA 05/30/2016 11:05 AM

## 2016-05-30 NOTE — Progress Notes (Signed)
Patient ID: Gwyneth RevelsMichael T Beyene, male   DOB: [birth year], 56 y.o.   MRN: [id number]  [facility] Referral form sent to [employer] for authorization. [facility]. Once [facility] is obtained, [facility].   [place] JonesAmaris G. Garnette CzechSampson MSW, [facility] CenterCSWA 05/30/2016 2:44 PM

## 2016-05-30 NOTE — Progress Notes (Signed)
Patient stays to himself.  No interaction with peers or staff.  Paces halls and mumbles to himself.  No group attendance. Medication compliant.  Good appetite.  Support offered.  Safety maintained.

## 2016-05-31 MED ORDER — METFORMIN HCL 500 MG PO TABS
500.0000 mg | ORAL_TABLET | Freq: Two times a day (BID) | ORAL | Status: DC
  Administered 2016-05-31 – 2016-07-10 (×80): 500 mg via ORAL
  Filled 2016-05-31 (×79): qty 1

## 2016-05-31 NOTE — Plan of Care (Signed)
Problem: Activity: Goal: Will verbalize the importance of balancing activity with adequate rest periods Outcome: Progressing Resting and no sign of distress

## 2016-05-31 NOTE — Progress Notes (Signed)
Recreation Therapy Notes  At approximately 11:40 am, LRT spoke with patient's nurse regarding assessment. Per nursing, patient is not answering questions appropriately at this time.   Jacquelynn CreeGreene,Shunte Senseney M, LRT/CTRS 05/31/2016 11:44 AM
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
## 2016-05-31 NOTE — Progress Notes (Signed)
Recreation Therapy Notes  Date: 05.11.18 Time: 9:30 am Location: Craft Room  Group Topic: Coping Skills  Goal Area(s) Addresses:  Patient will participate in healthy coping skill. Patient will verbalize what makes art a good coping skill.  Behavioral Response: Did not attend  Intervention: Coloring  Activity: Patients were given coloring sheets to color and were instructed to think of what emotions they were feeling as well as what their minds were focused on.  Education: LRT educated patients on healthy coping skills.  Education Outcome: Patient not appropriate for group at this time.  Clinical Observations/Feedback: Patient not appropriate for group at this time.  Jacquelynn CreeGreene,Lawerance Matsuo M, LRT/CTRS 05/31/2016 10:21 AM

## 2016-05-31 NOTE — [facility] Group Notes (Signed)
[facility] LCSW Group Therapy  05/31/2016 1:42 PM  Type of Therapy:  Group Therapy  Participation Level:  Did Not Attend. Patient is not appropriate for group at this time.   Summary of Progress/Problems: Stress management: Patients defined and discussed the topic of stress and the related symptoms and triggers for stress. Patients identified healthy coping skills they would like to try during hospitalization and after discharge to manage stress in a healthy way. CSW offered insight to varying stress management techniques.   Ethelle Ola G. Garnette CzechSampson MSW, LCSWA 05/31/2016, 1:43 PM

## 2016-05-31 NOTE — [facility] Group Notes (Signed)
[facility] Group Notes:  (Nursing/MHT/Case Management/Adjunct)  Date:  05/31/2016  Time:  4:34 PM  Type of Therapy:  Psychoeducational Skills  Participation Level:  Did Not Attend  Twanna Hymanda C Deakin Lacek 05/31/2016, 4:34 PM
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
## 2016-05-31 NOTE — Plan of Care (Signed)
Problem: Safety: Goal: Ability to remain free from injury will improve Outcome: Progressing No injury. Safety precautions maintained   

## 2016-06-01 NOTE — Plan of Care (Signed)
Problem: Coping: Goal: Ability to verbalize feelings will improve Outcome: Not Progressing Pt mumbles to self and is dismissive of staff at this time.

## 2016-06-01 NOTE — [facility] Group Notes (Signed)
[facility] LCSW Group Therapy Note  Date/Time: 06/01/16, 1300  Type of Therapy/Topic:  Group Therapy:  Balance in Life  Participation Level:  Pt did not attend group.  Description of Group:    This group will address the concept of balance and how it feels and looks when one is unbalanced. Patients will be encouraged to process areas in their lives that are out of balance, and identify reasons for remaining unbalanced. Facilitators will guide patients utilizing problem- solving interventions to address and correct the stressor making their life unbalanced. Understanding and applying boundaries will be explored and addressed for obtaining  and maintaining a balanced life. Patients will be encouraged to explore ways to assertively make their unbalanced needs known to significant others in their lives, using other group members and facilitator for support and feedback.  Therapeutic Goals: 1. Patient will identify two or more emotions or situations they have that consume much of in their lives. 2. Patient will identify signs/triggers that life has become out of balance:  3. Patient will identify two ways to set boundaries in order to achieve balance in their lives:  4. Patient will demonstrate ability to communicate their needs through discussion and/or role plays  Summary of Patient Progress:          Therapeutic Modalities:   Cognitive Behavioral Therapy Solution-Focused Therapy Assertiveness Training  Greg Lakeria Starkman, LCSW 

## 2016-06-01 NOTE — Progress Notes (Signed)
MEDICATION RELATED CONSULT NOTE - FOLLOW UP   Pharmacy Consult for Clozapine Monitoring  Indication: Schizophrenia  No Known Allergies  Patient Measurements: Height: 5\' 8"  (172.7 cm) Weight: 177 lb (80.3 kg) IBW/kg (Calculated) : 68.4   Vital Signs: Temp: 97.9 F (36.6 C) (05/12 0723) Temp Source: Oral (05/12 0723) BP: 116/78 (05/12 0723) Pulse Rate: 105 (05/12 0723)  Labs: No results for input(s): WBC, HGB, HCT, PLT, APTT, CREATININE, LABCREA, CREATININE, CREAT24HRUR, MG, PHOS, ALBUMIN, PROT, ALBUMIN, AST, ALT, ALKPHOS, BILITOT, BILIDIR, IBILI in the last 72 hours. Estimated Creatinine Clearance: 102.1 mL/min (by C-G formula based on SCr of 0.75 mg/dL).     Plan:  Patient registered and eligible with [facility] under Dr. Toni Amendlapacs  [id number] Zip [phone]  Next ANC due 06/03/16 while hospitalized  05/27/16  ANC 2700  [place] LabsKaren K Dyna Figuereo, RPh 06/01/2016,7:29 AM

## 2016-06-01 NOTE — [facility] Group Notes (Signed)
[facility] Group Notes:  (Nursing/MHT/Case Management/Adjunct)  Date:  06/01/2016  Time:  2:21 AM  Type of Therapy:  Psychoeducational Skills  Participation Level:  Did Not Attend  Jeffrey [name] 06/01/2016, 2:21 AM

## 2016-06-01 NOTE — Progress Notes (Signed)
Pt denies SI/HI/AVH although Pt is dismissive and difficult to understand due to mumbling.  Paces unit. Seen talking to himself in hallway. Disheveled. Poor hygiene. Refuses to wash clothes. Staff offered Pt scrubs to wash clothes, Pt refused. Medication compliant. Denies pain.Refuses to take care of hygiene at this time. Strong Urine odor Voices no concerns at this time. Safety maintained. Will continue to monitor.

## 2016-06-01 NOTE — [facility] Group Notes (Signed)
[facility] Group Notes:  (Nursing/MHT/Case Management/Adjunct)  Date:  06/01/2016  Time:  11:32 PM  Type of Therapy:  Group Therapy  Participation Level:  Active  Participation Quality:  He asking when is snack over and over.  Affect:  Blunted  Cognitive:  Disorganized  Insight:  Good  Engagement in Group:  None  Modes of Intervention:  Clarification  Summary of Progress/Problems:  Jeffrey [name] 06/01/2016, 11:32 PM

## 2016-06-01 NOTE — Progress Notes (Signed)
D: Pt oriented to self only, "I'm just waiting on my mother to pick me up" on initial approach. Denies SI, HI, AVH and pain when assessed. Presents with blunted affect, speech is tangential with loose association. Irritable, refused MD's assessment this Am despite encouragement from Clinical research associatewriter. Isolative to his room majority of this shift. Refused to shower and changed clothes as well. Took his medications without issues, denies adverse drug reactions.  A: Scheduled and PRN (Ativan) medications administered as prescribed and effects monitored. Emotional support and availability provided to pt. Encouraged pt to voice concerns and comply with current treatment regimen including unit groups / activities. Q 15 minutes safety checks maintained without outburst or self harm gestures.  R: Denies concerns at this time. Pt did not attend unit groups as encouraged. Went to courtyard with peers briefly this afternoon. Safety maintained on and off unit.

## 2016-06-01 NOTE — Progress Notes (Signed)
Pt denies SI/HI/AVH although Pt is dismissive and difficult to understand due to mumbling.  Paces unit. Seen talking to himself in hallway. Disheveled. Poor hygiene. Medication compliant. Denies pain.Refuses to take care of hygiene at this time. Strong odor Voices no concerns at this time. Safety maintained. Will continue to monitor.
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
## 2016-06-02 NOTE — Progress Notes (Signed)
Patient paces the hall.  Talks to self.  No interaction.  Attended group but did not participate.  Medication compliant.  Patient took a shower and allowed his clothes to be washed.  Informed by MHT that patient is urinating in the floor.  Support offered. Safety maintained.

## 2016-06-02 NOTE — Progress Notes (Signed)
Pt denies SI/HI/AVH although Pt is dismissive and difficult to understand due to mumbling.  Paces unit. Seen talking to himself in hallway. Disheveled. Poor hygiene. Medication compliant. Denies pain.Refuses to take care of hygiene at this time.  Voices no concerns at this time. Safety maintained. Will continue to monitor.

## 2016-06-02 NOTE — [facility] Group Notes (Signed)
[facility] LCSW Group Therapy   06/02/2016 1 PM   Type of Therapy: Group Therapy   Participation Level: Patient invited but did not attend.  Hampton AbbotKadijah Travelle Mcclimans, MSW, LCSW-A 06/02/2016, 3:35PM

## 2016-06-03 LAB — CBC WITH DIFFERENTIAL/PLATELET
BASOS ABS: 0 10*3/uL (ref 0–0.1)
Basophils Relative: 1 %
EOS ABS: 0 10*3/uL (ref 0–0.7)
Eosinophils Relative: 0 %
HEMATOCRIT: 42.2 % (ref 40.0–52.0)
HEMOGLOBIN: 14.8 g/dL (ref 13.0–18.0)
Lymphocytes Relative: 31 %
Lymphs Abs: 2 10*3/uL (ref 1.0–3.6)
MCH: 29.7 pg (ref 26.0–34.0)
MCHC: 35.2 g/dL (ref 32.0–36.0)
MCV: 84.4 fL (ref 80.0–100.0)
Monocytes Absolute: 0.6 10*3/uL (ref 0.2–1.0)
Monocytes Relative: 9 %
NEUTROS ABS: 3.8 10*3/uL (ref 1.4–6.5)
NEUTROS PCT: 59 %
Platelets: 208 10*3/uL (ref 150–440)
RBC: 5 MIL/uL (ref 4.40–5.90)
RDW: 13.2 % (ref 11.5–14.5)
WBC: 6.3 10*3/uL (ref 3.8–10.6)

## 2016-06-03 LAB — VALPROIC ACID LEVEL: Valproic Acid Lvl: 105 ug/mL — ABNORMAL HIGH (ref 50.0–100.0)

## 2016-06-03 NOTE — Plan of Care (Signed)
Problem: Self-Care: Goal: Ability to participate in self-care as condition permits will improve Outcome: Not Progressing Disheveled and notable body odor.

## 2016-06-03 NOTE — Progress Notes (Signed)
Patient continues to pace halls and isolates.  Mumbles when he talks.  Poor hygiene.  Room smells of urine.  Medication compliant.  Will show up for group but will not stay.  Support offered.  Safety checks maintained.

## 2016-06-03 NOTE — Plan of Care (Signed)
Problem: Safety: Goal: Ability to remain free from injury will improve Outcome: Progressing Patient contracts for safety on the unit and is on q15 minute safety checks. Patient is free of injury at this time.

## 2016-06-03 NOTE — Progress Notes (Signed)
Nursing Progress Note [id number]  D) Patient presents disorganized and bizarre but is calm and cooperative with Clinical research associatewriter. Patient is isolative to room. Patient denies SI/HI/AVH or pain but is observed mumbling and responding to internal stimuli. Patient contracts for safety on the unit. Patient reports sleeping well with current regimen. Patient requested his medications this evening and was agreeable to plan of care.  A)  Emotional support given. 1:1 interaction and active listening provided. Patient medicated as prescribed. Medications and plan of care reviewed with patient. Patient verbalized understanding without further questions.  Snacks and fluids provided. Opportunities for questions or concerns presented to patient. Patient encouraged to continue to work on treatment goals. Labs, vital signs and patient behavior monitored throughout shift. Patient safety maintained with q15 min safety checks. High fall risk precautions in place and reviewed with patient; patient verbalized understanding.  R) Patient receptive to interaction with nurse. Patient remains safe on the unit at this time. Patient denies any adverse medication reactions at this time. Patient is resting in bed without complaints. Will continue to monitor.
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
## 2016-06-03 NOTE — Tx Team (Signed)
Interdisciplinary Treatment and Diagnostic Plan Update  06/03/2016 Time of Session: 10:30 AM [name] MRN: [id number]  Principal Diagnosis: Schizophrenia, undifferentiated (HCC)  Secondary Diagnoses: Principal Problem:   Schizophrenia, undifferentiated (HCC) Active Problems:   Tobacco use disorder   Noncompliance   Dyslipidemia   Constipation   Current Medications:  Current Facility-Administered Medications  Medication Dose Route Frequency Provider Last Rate Last Dose  . acetaminophen (TYLENOL) tablet 650 mg  650 mg Oral Q6H PRN Jeffrey [name], John T, MD      . alum & mag hydroxide-simeth (MAALOX/MYLANTA) 200-200-20 MG/5ML suspension 30 mL  30 mL Oral Q4H PRN Jeffrey [name], John T, MD      . chlorproMAZINE (THORAZINE) tablet 50 mg  50 mg Oral TID PRN Pucilowska, [name], MD   50 mg at 05/30/16 0817  . cloZAPine (CLOZARIL) tablet 100 mg  100 mg Oral Daily Jeffrey [name], Jeffrey Denmark, MD   100 mg at 06/03/16 0847  . cloZAPine (CLOZARIL) tablet 400 mg  400 mg Oral QHS Jeffrey [name], Jeffrey Denmark, MD   400 mg at 06/02/16 2049  . divalproex (DEPAKOTE) DR tablet 1,000 mg  1,000 mg Oral BH-q7a Jeffrey [name], Jeffrey Denmark, MD   1,000 mg at 06/03/16 0848  . divalproex (DEPAKOTE) DR tablet 1,500 mg  1,500 mg Oral QHS Jeffrey [name], John T, MD   1,500 mg at 06/02/16 2049  . LORazepam (ATIVAN) tablet 2 mg  2 mg Oral Q4H PRN Pucilowska, [name], MD   2 mg at 06/01/16 2032  . magnesium hydroxide (MILK OF MAGNESIA) suspension 30 mL  30 mL Oral Daily PRN Jeffrey [name], John T, MD      . metFORMIN (GLUCOPHAGE) tablet 500 mg  500 mg Oral BID WC Pucilowska, [name], MD   500 mg at 06/03/16 0847  . senna (SENOKOT) tablet 17.2 mg  2 tablet Oral QHS Jeffrey [name], John T, MD   17.2 mg at 06/02/16 2048  . simvastatin (ZOCOR) tablet 40 mg  40 mg Oral QHS Jeffrey [name], Jeffrey Denmark, MD   40 mg at 06/02/16 2050  . traZODone (DESYREL) tablet 200 mg  200 mg Oral QHS Jeffrey [name], John T, MD   200 mg at 06/02/16 2050   PTA Medications: Prescriptions Prior to Admission   Medication Sig Dispense Refill Last Dose  . cloZAPine (CLOZARIL) 100 MG tablet Take 1-4 tablets (100-400 mg total) by mouth 2 (two) times daily. Patient takes 1 tablet (100 mg) in the morning and 4 tablets (400 mg) at bedtime. (Patient not taking: Reported on 04/26/2016) 150 tablet 0 Not Taking at Unknown time  . paliperidone (INVEGA) 6 MG 24 hr tablet Take 6 mg by mouth 2 (two) times daily.   Not Taking at Unknown time  . senna (SENOKOT) 8.6 MG TABS tablet Take 2 tablets (17.2 mg total) by mouth at bedtime. (Patient not taking: Reported on 04/26/2016) 120 each 0 Not Taking at Unknown time  . simvastatin (ZOCOR) 40 MG tablet Take 40 mg by mouth at bedtime.   Not Taking at Unknown time  . traZODone (DESYREL) 100 MG tablet Take 200 mg by mouth at bedtime.   Not Taking at Unknown time    Patient Stressors: Marital or family conflict Medication change or noncompliance Traumatic event  Patient Strengths: Astronomer fund of knowledge Physical Health  Treatment Modalities: Medication Management, Group therapy, Case management,  1 to 1 session with clinician, Psychoeducation, Recreational therapy.   Physician Treatment Plan for Primary Diagnosis: Schizophrenia, undifferentiated (HCC) Long Term Goal(s): Improvement in symptoms  so as ready for discharge NA   Short Term Goals: Ability to identify changes in lifestyle to reduce recurrence of condition will improve Ability to verbalize feelings will improve Ability to disclose and discuss suicidal ideas Ability to demonstrate self-control will improve Ability to identify and develop effective coping behaviors will improve Compliance with prescribed medications will improve Ability to identify triggers associated with substance abuse/mental health issues will improve NA  Medication Management: Evaluate patient's response, side effects, and tolerance of medication regimen.  Therapeutic Interventions: 1 to 1 sessions, Unit Group  sessions and Medication administration.  Evaluation of Outcomes: Progressing  Physician Treatment Plan for Secondary Diagnosis: Principal Problem:   Schizophrenia, undifferentiated (HCC) Active Problems:   Tobacco use disorder   Noncompliance   Dyslipidemia   Constipation  Long Term Goal(s): Improvement in symptoms so as ready for discharge NA   Short Term Goals: Ability to identify changes in lifestyle to reduce recurrence of condition will improve Ability to verbalize feelings will improve Ability to disclose and discuss suicidal ideas Ability to demonstrate self-control will improve Ability to identify and develop effective coping behaviors will improve Compliance with prescribed medications will improve Ability to identify triggers associated with substance abuse/mental health issues will improve NA     Medication Management: Evaluate patient's response, side effects, and tolerance of medication regimen.  Therapeutic Interventions: 1 to 1 sessions, Unit Group sessions and Medication administration.  Evaluation of Outcomes: Progressing   RN Treatment Plan for Primary Diagnosis: Schizophrenia, undifferentiated (HCC) Long Term Goal(s): Knowledge of disease and therapeutic regimen to maintain health will improve  Short Term Goals: Ability to remain free from injury will improve, Ability to verbalize frustration and anger appropriately will improve, Ability to demonstrate self-control, Ability to participate in decision making will improve and Ability to verbalize feelings will improve  Medication Management: RN will administer medications as ordered by provider, will assess and evaluate patient's response and provide education to patient for prescribed medication. RN will report any adverse and/or side effects to prescribing provider.  Therapeutic Interventions: 1 on 1 counseling sessions, Psychoeducation, Medication administration, Evaluate responses to treatment, Monitor vital  signs and CBGs as ordered, Perform/monitor CIWA, COWS, AIMS and Fall Risk screenings as ordered, Perform wound care treatments as ordered.  Evaluation of Outcomes: Progressing   LCSW Treatment Plan for Primary Diagnosis: Schizophrenia, undifferentiated (HCC) Long Term Goal(s): Safe transition to appropriate next level of care at discharge, Engage patient in therapeutic group addressing interpersonal concerns.  Short Term Goals: Engage patient in aftercare planning with referrals and resources, Increase social support, Increase ability to appropriately verbalize feelings and Increase emotional regulation  Therapeutic Interventions: Assess for all discharge needs, 1 to 1 time with Social worker, Explore available resources and support systems, Assess for adequacy in community support network, Educate family and significant other(s) on suicide prevention, Complete Psychosocial Assessment, Interpersonal group therapy.  Evaluation of Outcomes: Progressing   Progress in Treatment: Attending groups: No. Participating in groups: No. Taking medication as prescribed: Yes. Toleration medication: Yes. Family/Significant other contact made: Yes, individual(s) contacted:  CSW spoke with patient's legal guardian Patient understands diagnosis: Yes. Discussing patient identified problems/goals with staff: Yes. Medical problems stabilized or resolved: Yes. Denies suicidal/homicidal ideation: Yes. Issues/concerns per patient self-inventory: No.  New problem(s) identified: No, Describe:  None identified.  New Short Term/Long Term Goal(s): Patient unable to set goal at this time.  Discharge Plan or Barriers: CSW assessing proper discharge plans at this time.  Reason for Continuation of Hospitalization: Aggression  Delusions  Hallucinations  Estimated Length of Stay: 7 days   Attendees: Patient: Jeffrey [name]  06/03/2016 1:57 PM  Physician: Dr. Kristine Linea, MD  06/03/2016 1:57 PM  Nursing:  Leonia Reader, BSN, RN  06/03/2016 1:57 PM  RN Care Manager: 06/03/2016 1:57 PM  Social Worker: Hampton Abbot, MSW, LCSW-A 06/03/2016 1:57 PM  Recreational Therapist:  06/03/2016 1:57 PM  Other:  06/03/2016 1:57 PM  Other:  06/03/2016 1:57 PM  Other: 06/03/2016 1:57 PM    Scribe for Treatment Team: Lynden Oxford, LCSWA 06/03/2016 1:57 PM

## 2016-06-03 NOTE — Social Work (Signed)
CSW spoke with patient's guardian, Rosine AbeRon Hardy who stated that he will attend patient's hearing 06/04/2016 at [place].  Hampton AbbotKadijah Markeshia Giebel, MSW, LCSW-A 06/03/2016, 3:15PM

## 2016-06-03 NOTE — [facility] Group Notes (Signed)
[facility] Group Notes:  (Nursing/MHT/Case Management/Adjunct)  Date:  06/03/2016  Time:  4:13 PM  Type of Therapy:  Psychoeducational Skills  Participation Level:  Did Not Attend  Eleyna Brugh C Keyandra Swenson 06/03/2016, 4:13 PM 

## 2016-06-03 NOTE — [facility] Group Notes (Signed)
[facility] Group Notes:  (Nursing/MHT/Case Management/Adjunct)  Date:  06/03/2016  Time:  9:08 PM  Type of Therapy:  Group Therapy  Participation Level:  Active  Participation Quality:  Appropriate  Affect:  Appropriate  Cognitive:  Appropriate  Insight:  Appropriate  Engagement in Group:  Limited  Modes of Intervention:  Limit-setting  Summary of Progress/Problems:  Burt EkJanice Marie Yazmeen Woolf 06/03/2016, 9:08 PM

## 2016-06-04 NOTE — Plan of Care (Signed)
Problem: Self-Care: Goal: Ability to participate in self-care as condition permits will improve Outcome: Not Progressing Poor hygiene

## 2016-06-04 NOTE — [facility] Group Notes (Signed)
[facility] LCSW Group Therapy Note  Date/Time: 06/04/16, 1500  Type of Therapy/Topic:  Group Therapy:  Feelings about Diagnosis  Participation Level:  None   Mood:pleasant   Description of Group:    This group will allow patients to explore their thoughts and feelings about diagnoses they have received. Patients will be guided to explore their level of understanding and acceptance of these diagnoses. Facilitator will encourage patients to process their thoughts and feelings about the reactions of others to their diagnosis, and will guide patients in identifying ways to discuss their diagnosis with significant others in their lives. This group will be process-oriented, with patients participating in exploration of their own experiences as well as giving and receiving support and challenge from other group members.   Therapeutic Goals: 1. Patient will demonstrate understanding of diagnosis as evidence by identifying two or more symptoms of the disorder:  2. Patient will be able to express two feelings regarding the diagnosis 3. Patient will demonstrate ability to communicate their needs through discussion and/or role plays  Summary of Patient Progress:Pt walked into group late and did not pay much attention but displayed mostly appropriate behavior for the rest of the group.  A few times he talked to the person sitting next to him somewhat loudly.        Therapeutic Modalities:   Cognitive Behavioral Therapy Brief Therapy Feelings Identification   Jeffrey SquibbGreg Romen Yutzy, LCSW
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
## 2016-06-04 NOTE — Progress Notes (Signed)
Patient continues to stay to himself and no interaction with peers or staff.  Attending groups although there is no active participation.  Speech is clearer.  Doesn't appear to be responding to any internal stimuli today.  Medication compliant.  Support offered.  Safety checks maintained.

## 2016-06-04 NOTE — [facility] Group Notes (Signed)
[facility] Group Notes:  (Nursing/MHT/Case Management/Adjunct)  Date:  06/04/2016  Time:  3:01 PM  Type of Therapy:  Psychoeducational Skills  Participation Level:  None  Participation Quality:  Inattentive  Affect:  Labile  Cognitive:  Appropriate  Insight:  Improving  Engagement in Group:  None  Modes of Intervention:  Discussion and Education  Summary of Progress/Problems:  Twanna Hymanda C Tanairy Payeur 06/04/2016, 3:01 PM

## 2016-06-05 NOTE — [facility] Group Notes (Signed)
[facility] Group Notes:  (Nursing/MHT/Case Management/Adjunct)  Date:  06/05/2016  Time:  4:29 PM  Type of Therapy:  Psychoeducational Skills  Participation Level:  Did Not Attend    Jeffrey [name] 06/05/2016, 4:29 PM

## 2016-06-05 NOTE — Plan of Care (Signed)
Problem: Pain Managment: Goal: General experience of comfort will improve Outcome: Progressing Patient denies pain and appears to be in no distress.

## 2016-06-05 NOTE — Tx Team (Signed)
Interdisciplinary Treatment and Diagnostic Plan Update  06/05/2016 Time of Session: 10:30 AM Jeffrey RevelsMichael T [name] MRN: [id number]  Principal Diagnosis: Schizophrenia, undifferentiated (HCC)  Secondary Diagnoses: Principal Problem:   Schizophrenia, undifferentiated (HCC) Active Problems:   Tobacco use disorder   Noncompliance   Dyslipidemia   Constipation   Current Medications:  Current Facility-Administered Medications  Medication Dose Route Frequency Provider Last Rate Last Dose  . acetaminophen (TYLENOL) tablet 650 mg  650 mg Oral Q6H PRN Clapacs, John T, MD      . alum & mag hydroxide-simeth (MAALOX/MYLANTA) 200-200-20 MG/5ML suspension 30 mL  30 mL Oral Q4H PRN Clapacs, John T, MD      . chlorproMAZINE (THORAZINE) tablet 50 mg  50 mg Oral TID PRN Pucilowska, [name], MD   50 mg at 06/05/16 1342  . cloZAPine (CLOZARIL) tablet 100 mg  100 mg Oral Daily Clapacs, Jackquline DenmarkJohn T, MD   100 mg at 06/05/16 0818  . cloZAPine (CLOZARIL) tablet 400 mg  400 mg Oral QHS Clapacs, Jackquline DenmarkJohn T, MD   400 mg at 06/04/16 2127  . divalproex (DEPAKOTE) DR tablet 1,000 mg  1,000 mg Oral BH-q7a Clapacs, Jackquline DenmarkJohn T, MD   1,000 mg at 06/05/16 0817  . divalproex (DEPAKOTE) DR tablet 1,500 mg  1,500 mg Oral QHS Clapacs, John T, MD   1,500 mg at 06/04/16 2126  . LORazepam (ATIVAN) tablet 2 mg  2 mg Oral Q4H PRN Pucilowska, [name], MD   2 mg at 06/01/16 2032  . magnesium hydroxide (MILK OF MAGNESIA) suspension 30 mL  30 mL Oral Daily PRN Clapacs, John T, MD      . metFORMIN (GLUCOPHAGE) tablet 500 mg  500 mg Oral BID WC Pucilowska, [name], MD   500 mg at 06/05/16 0818  . senna (SENOKOT) tablet 17.2 mg  2 tablet Oral QHS Clapacs, Jackquline DenmarkJohn T, MD   17.2 mg at 06/04/16 2127  . simvastatin (ZOCOR) tablet 40 mg  40 mg Oral QHS Clapacs, Jackquline DenmarkJohn T, MD   40 mg at 06/04/16 2127  . traZODone (DESYREL) tablet 200 mg  200 mg Oral QHS Clapacs, Jackquline DenmarkJohn T, MD   200 mg at 06/04/16 2127   PTA Medications: Prescriptions Prior to Admission   Medication Sig Dispense Refill Last Dose  . cloZAPine (CLOZARIL) 100 MG tablet Take 1-4 tablets (100-400 mg total) by mouth 2 (two) times daily. Patient takes 1 tablet (100 mg) in the morning and 4 tablets (400 mg) at bedtime. (Patient not taking: Reported on 04/26/2016) 150 tablet 0 Not Taking at Unknown time  . paliperidone (INVEGA) 6 MG 24 hr tablet Take 6 mg by mouth 2 (two) times daily.   Not Taking at Unknown time  . senna (SENOKOT) 8.6 MG TABS tablet Take 2 tablets (17.2 mg total) by mouth at bedtime. (Patient not taking: Reported on 04/26/2016) 120 each 0 Not Taking at Unknown time  . simvastatin (ZOCOR) 40 MG tablet Take 40 mg by mouth at bedtime.   Not Taking at Unknown time  . traZODone (DESYREL) 100 MG tablet Take 200 mg by mouth at bedtime.   Not Taking at Unknown time    Patient Stressors: Marital or family conflict Medication change or noncompliance Traumatic event  Patient Strengths: Astronomerinancial means General fund of knowledge Physical Health  Treatment Modalities: Medication Management, Group therapy, Case management,  1 to 1 session with clinician, Psychoeducation, Recreational therapy.   Physician Treatment Plan for Primary Diagnosis: Schizophrenia, undifferentiated (HCC) Long Term Goal(s): Improvement in symptoms  so as ready for discharge NA   Short Term Goals: Ability to identify changes in lifestyle to reduce recurrence of condition will improve Ability to verbalize feelings will improve Ability to disclose and discuss suicidal ideas Ability to demonstrate self-control will improve Ability to identify and develop effective coping behaviors will improve Compliance with prescribed medications will improve Ability to identify triggers associated with substance abuse/mental health issues will improve NA  Medication Management: Evaluate patient's response, side effects, and tolerance of medication regimen.  Therapeutic Interventions: 1 to 1 sessions, Unit Group  sessions and Medication administration.  Evaluation of Outcomes: Progressing  Physician Treatment Plan for Secondary Diagnosis: Principal Problem:   Schizophrenia, undifferentiated (HCC) Active Problems:   Tobacco use disorder   Noncompliance   Dyslipidemia   Constipation  Long Term Goal(s): Improvement in symptoms so as ready for discharge NA   Short Term Goals: Ability to identify changes in lifestyle to reduce recurrence of condition will improve Ability to verbalize feelings will improve Ability to disclose and discuss suicidal ideas Ability to demonstrate self-control will improve Ability to identify and develop effective coping behaviors will improve Compliance with prescribed medications will improve Ability to identify triggers associated with substance abuse/mental health issues will improve NA     Medication Management: Evaluate patient's response, side effects, and tolerance of medication regimen.  Therapeutic Interventions: 1 to 1 sessions, Unit Group sessions and Medication administration.  Evaluation of Outcomes: Progressing   RN Treatment Plan for Primary Diagnosis: Schizophrenia, undifferentiated (HCC) Long Term Goal(s): Knowledge of disease and therapeutic regimen to maintain health will improve  Short Term Goals: Ability to remain free from injury will improve, Ability to verbalize frustration and anger appropriately will improve, Ability to demonstrate self-control, Ability to participate in decision making will improve and Ability to verbalize feelings will improve  Medication Management: RN will administer medications as ordered by provider, will assess and evaluate patient's response and provide education to patient for prescribed medication. RN will report any adverse and/or side effects to prescribing provider.  Therapeutic Interventions: 1 on 1 counseling sessions, Psychoeducation, Medication administration, Evaluate responses to treatment, Monitor vital  signs and CBGs as ordered, Perform/monitor CIWA, COWS, AIMS and Fall Risk screenings as ordered, Perform wound care treatments as ordered.  Evaluation of Outcomes: Progressing   LCSW Treatment Plan for Primary Diagnosis: Schizophrenia, undifferentiated (HCC) Long Term Goal(s): Safe transition to appropriate next level of care at discharge, Engage patient in therapeutic group addressing interpersonal concerns.  Short Term Goals: Engage patient in aftercare planning with referrals and resources, Increase social support, Increase ability to appropriately verbalize feelings and Increase emotional regulation  Therapeutic Interventions: Assess for all discharge needs, 1 to 1 time with Social worker, Explore available resources and support systems, Assess for adequacy in community support network, Educate family and significant other(s) on suicide prevention, Complete Psychosocial Assessment, Interpersonal group therapy.  Evaluation of Outcomes: Progressing   Progress in Treatment: Attending groups: No. Participating in groups: No. Taking medication as prescribed: Yes. Toleration medication: Yes. Family/Significant other contact made: Yes, individual(s) contacted:  CSW spoke with patient's legal guardian Patient understands diagnosis: Yes. Discussing patient identified problems/goals with staff: Yes. Medical problems stabilized or resolved: Yes. Denies suicidal/homicidal ideation: Yes. Issues/concerns per patient self-inventory: No.  New problem(s) identified: No, Describe:  None identified.  New Short Term/Long Term Goal(s): Patient unable to set goal at this time.  Discharge Plan or Barriers:  06/05/2016: Pt referred to [facility]. Pt is on waiting list.   Reason for  Continuation of Hospitalization: Aggression Delusions  Hallucinations  Estimated Length of Stay: 7 days   Attendees: Patient: Jeffrey [name]  06/05/2016 3:37 PM  Physician: Dr. Kristine Linea, MD  06/05/2016 3:37 PM   Nursing: Edyth Gunnels  06/05/2016 3:37 PM  RN Care Manager: 06/05/2016 3:37 PM  Social Worker: Rondall Allegra, LCSWA 06/05/2016 3:37 PM  Recreational Therapist:  06/05/2016 3:37 PM  Other:  06/05/2016 3:37 PM  Other:  06/05/2016 3:37 PM  Other: 06/05/2016 3:37 PM    Scribe for Treatment Team: Rondall Allegra, LCSWA 06/05/2016 3:37 PM
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
## 2016-06-05 NOTE — Progress Notes (Signed)
Patient is pacing in the hallway,came to med room and asked for medicine.Patient stated that he is little anxious.PRN given.No interactions with peers.Poor hygiene.Compliant with medications.Appetite & energy level good.Support & encouragement given.

## 2016-06-05 NOTE — Progress Notes (Signed)
Nursing Progress Note 1900-0730  D) Patient presents disorganized, paranoid and delusional. Patient is calm and cooperative on the unit and takes his medications as prescribed. Patient is isolative to self but seen pacing throughout unit. Patient denies SI/HI/AVH or pain but appears to be responding to internal stimuli and is cautious of staff. Patient contracts for safety on the unit.   A)  Emotional support given. 1:1 interaction and active listening provided. Patient medicated as prescribed. Medications and plan of care reviewed with patient. Patient verbalized understanding without further questions.  Snacks and fluids provided. Opportunities for questions or concerns presented to patient. Patient encouraged to continue to work on treatment goals. Labs, vital signs and patient behavior monitored throughout shift. Patient safety maintained with q15 min safety checks. High fall risk precautions in place and reviewed with patient; patient verbalized understanding.  R) Patient receptive to interaction with nurse. Patient remains safe on the unit at this time. Patient denies any adverse medication reactions at this time. Patient is resting in bed without complaints. Will continue to monitor.

## 2016-06-05 NOTE — [facility] Group Notes (Signed)
  [facility] LCSW Group Therapy Note  Date/Time: 06/05/16, 1300  Type of Therapy/Topic:  Group Therapy:  Emotion Regulation  Participation Level:  None   Mood:pleasant  Description of Group:    The purpose of this group is to assist patients in learning to regulate negative emotions and experience positive emotions. Patients will be guided to discuss ways in which they have been vulnerable to their negative emotions. These vulnerabilities will be juxtaposed with experiences of positive emotions or situations, and patients challenged to use positive emotions to combat negative ones. Special emphasis will be placed on coping with negative emotions in conflict situations, and patients will process healthy conflict resolution skills.  Therapeutic Goals: 1. Patient will identify two positive emotions or experiences to reflect on in order to balance out negative emotions:  2. Patient will label two or more emotions that they find the most difficult to experience:  3. Patient will be able to demonstrate positive conflict resolution skills through discussion or role plays:   Summary of Patient Progress: Pt attended group and talks aloud to himself throughout the session but causes no other issues.       Therapeutic Modalities:   Cognitive Behavioral Therapy Feelings Identification Dialectical Behavioral Therapy  Daleen SquibbGreg Elvenia Godden, LCSW

## 2016-06-06 NOTE — [facility] Group Notes (Signed)
[facility] Group Notes:  (Nursing/MHT/Case Management/Adjunct)  Date:  06/06/2016  Time:  5:45 AM  Type of Therapy:  Group Therapy  Participation Level:  Active  Participation Quality:  Appropriate  Affect:  Appropriate  Cognitive:  Appropriate  Insight:  Appropriate  Engagement in Group:  Engaged  Modes of Intervention:  Discussion  Summary of Progress/Problems: Pt sat in the hallway outside the group room. He shared his goal of having a good day when asked. He said he had accomplished that goal and begin laughing to himself. When asked why he was laughing. He shook his head and mumbled something staff could not understand.   Fanny Skatesshley Imani Arionna Hoggard 06/06/2016, 5:45 AM

## 2016-06-06 NOTE — [facility] Group Notes (Signed)
[facility] Group Notes:  (Nursing/MHT/Case Management/Adjunct)  Date:  06/06/2016  Time:  4:16 PM  Type of Therapy:  Psychoeducational Skills  Participation Level:  Minimal  Participation Quality:  Attentive, Sharing and Supportive  Affect:  Appropriate  Cognitive:  Alert and Appropriate  Insight:  Improving  Engagement in Group:  Engaged, Improving and Supportive  Modes of Intervention:  Discussion, Education, Exploration and Support  Summary of Progress/Problems:  Jeffrey [name] 06/06/2016, 4:16 PM

## 2016-06-06 NOTE — Progress Notes (Signed)
D: Pt  +ve AH- contracts for safety denies SI/HI/VH. Pt is pleasant and cooperative. Pt stated he was fine. Pt appeared a little anxious , pt stated he was tired most of the day. Pt appeard a little paranoid of writer, but engaged when approached.    A: Pt was offered support and encouragement. Pt was given scheduled medications. Pt was encourage to attend groups. Q 15 minute checks were done for safety.   R:Pt attends groups and interacts well with peers and staff. Pt is taking medication. Pt has no complaints.Pt receptive to treatment and safety maintained on unit.

## 2016-06-06 NOTE — Plan of Care (Signed)
Problem: Safety: Goal: Ability to remain free from injury will improve Outcome: Progressing Pt safe on the unit at this time   

## 2016-06-06 NOTE — [facility] Group Notes (Signed)
Goals Group Date/Time: 06/06/2016 9:00 AM Type of Therapy and Topic: Group Therapy: Goals Group: SMART Goals   Participation Level: Moderate  Description of Group:    The purpose of a daily goals group is to assist and guide patients in setting recovery/wellness-related goals. The objective is to set goals as they relate to the crisis in which they were admitted. Patients will be using SMART goal modalities to set measurable goals. Characteristics of realistic goals will be discussed and patients will be assisted in setting and processing how one will reach their goal. Facilitator will also assist patients in applying interventions and coping skills learned in psycho-education groups to the SMART goal and process how one will achieve defined goal.   Therapeutic Goals:   -Patients will develop and document one goal related to or their crisis in which brought them into treatment.  -Patients will be guided by LCSW using SMART goal setting modality in how to set a measurable, attainable, realistic and time sensitive goal.  -Patients will process barriers in reaching goal.  -Patients will process interventions in how to overcome and successful in reaching goal.   Patient's Goal: Pt left group early and did not participate in goal setting.   Therapeutic Modalities:  Motivational Interviewing  Research officer, political partyCognitive Behavioral Therapy  Crisis Intervention Model  SMART goals setting   Daleen SquibbGreg Canesha Tesfaye, [place]CS[place]

## 2016-06-06 NOTE — Progress Notes (Signed)
Pt irritable on the unit, when writer tried to talk to pt pt go up stormed away " I DON'T WANT TO BE BOTHERED WITH YOU, MY MOMMA JUST DIED"

## 2016-06-06 NOTE — Progress Notes (Signed)
Patient smiled and waved to staff this morning.Patient stated that he is hearing voices but he mumbles very less.Denies suicidal or homicidal ideations.Patients speech is more coherent & appropriate.Attended group.Compliant with medications.Appetite & energy level good.Support & encouragement given.

## 2016-06-06 NOTE — [facility] Group Notes (Signed)
[facility] LCSW Group Therapy   06/06/2016 1pm   Type of Therapy: Group Therapy   Participation Level: None     Engagement in Therapy: Developing/Improving   Modes of Intervention: Clarification, Confrontation, Discussion, Education, Exploration, Limit-setting, Orientation, Problem-solving, Rapport Building, Dance movement psychotherapisteality Testing, Socialization and Support   Summary of Progress/Problems: The topic for group was balance in life. Today's group focused on defining balance in one's own words, identifying things that can knock one off balance, and exploring healthy ways to maintain balance in life. Group members were asked to provide an example of a time when they felt off balance, describe how they handled that situation, and process healthier ways to regain balance in the future. Group members were asked to share the most important tool for maintaining [facility] and how they plan to apply this method after discharge. Patient left group early and did not participate in group discussion.   Hampton AbbotKadijah Matthews Franks, MSW, LCSW-A 06/06/2016, 2:24PM
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
## 2016-06-07 NOTE — Progress Notes (Signed)
Pt was avoidant , but pt was observed talking to someone not observed by Clinical research associatewriter.

## 2016-06-07 NOTE — Progress Notes (Signed)
[name] from [facility] ACT team stopped by to see pt.  She said it was not normal for pt to ignore his hygiene like this. Garner NashGregory Kalup Jaquith, MSW, LCSW Clinical Social Worker 06/07/2016 1:59 PM
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
## 2016-06-07 NOTE — [facility] Group Notes (Signed)
[facility] LCSW Group Therapy  06/07/2016 3:20 PM  Type of Therapy:  Group Therapy  Participation Level:  Did Not Attend  Summary of Progress/Problems: Stress management: Patients defined and discussed the topic of stress and the related symptoms and triggers for stress. Patients identified healthy coping skills they would like to try during hospitalization and after discharge to manage stress in a healthy way. CSW offered insight to varying stress management techniques.   Jonan Seufert G. Garnette CzechSampson MSW, LCSWA 06/07/2016, 3:21 PM

## 2016-06-07 NOTE — Plan of Care (Signed)
Problem: Pain Managment: Goal: General experience of comfort will improve Outcome: Progressing Patient appears to be dealing with his pain issues.  He is agreeable to using a lidocaine patch for his neck pain.

## 2016-06-07 NOTE — Tx Team (Signed)
Interdisciplinary Treatment and Diagnostic Plan Update  06/07/2016 Time of Session: 10:30am Jeffrey RevelsMichael T [name] MRN: [id number]  Principal Diagnosis: Schizophrenia, undifferentiated (HCC)  Secondary Diagnoses: Principal Problem:   Schizophrenia, undifferentiated (HCC) Active Problems:   Tobacco use disorder   Noncompliance   Dyslipidemia   Constipation   Current Medications:  Current Facility-Administered Medications  Medication Dose Route Frequency Provider Last Rate Last Dose  . acetaminophen (TYLENOL) tablet 650 mg  650 mg Oral Q6H PRN Clapacs, John T, MD      . alum & mag hydroxide-simeth (MAALOX/MYLANTA) 200-200-20 MG/5ML suspension 30 mL  30 mL Oral Q4H PRN Clapacs, John T, MD      . chlorproMAZINE (THORAZINE) tablet 50 mg  50 mg Oral TID PRN Pucilowska, [name], MD   50 mg at 06/07/16 0840  . cloZAPine (CLOZARIL) tablet 100 mg  100 mg Oral Daily Clapacs, Jackquline DenmarkJohn T, MD   100 mg at 06/07/16 0841  . cloZAPine (CLOZARIL) tablet 400 mg  400 mg Oral QHS Clapacs, Jackquline DenmarkJohn T, MD   400 mg at 06/06/16 2143  . divalproex (DEPAKOTE) DR tablet 1,000 mg  1,000 mg Oral BH-q7a Clapacs, Jackquline DenmarkJohn T, MD   1,000 mg at 06/07/16 0840  . divalproex (DEPAKOTE) DR tablet 1,500 mg  1,500 mg Oral QHS Clapacs, John T, MD   1,500 mg at 06/06/16 2142  . LORazepam (ATIVAN) tablet 2 mg  2 mg Oral Q4H PRN Pucilowska, [name], MD   2 mg at 06/01/16 2032  . magnesium hydroxide (MILK OF MAGNESIA) suspension 30 mL  30 mL Oral Daily PRN Clapacs, John T, MD      . metFORMIN (GLUCOPHAGE) tablet 500 mg  500 mg Oral BID WC Pucilowska, [name], MD   500 mg at 06/07/16 0842  . senna (SENOKOT) tablet 17.2 mg  2 tablet Oral QHS Clapacs, John T, MD   17.2 mg at 06/06/16 2143  . simvastatin (ZOCOR) tablet 40 mg  40 mg Oral QHS Clapacs, Jackquline DenmarkJohn T, MD   40 mg at 06/06/16 2143  . traZODone (DESYREL) tablet 200 mg  200 mg Oral QHS Clapacs, John T, MD   200 mg at 06/06/16 2142   PTA Medications: Prescriptions Prior to Admission   Medication Sig Dispense Refill Last Dose  . cloZAPine (CLOZARIL) 100 MG tablet Take 1-4 tablets (100-400 mg total) by mouth 2 (two) times daily. Patient takes 1 tablet (100 mg) in the morning and 4 tablets (400 mg) at bedtime. (Patient not taking: Reported on 04/26/2016) 150 tablet 0 Not Taking at Unknown time  . paliperidone (INVEGA) 6 MG 24 hr tablet Take 6 mg by mouth 2 (two) times daily.   Not Taking at Unknown time  . senna (SENOKOT) 8.6 MG TABS tablet Take 2 tablets (17.2 mg total) by mouth at bedtime. (Patient not taking: Reported on 04/26/2016) 120 each 0 Not Taking at Unknown time  . simvastatin (ZOCOR) 40 MG tablet Take 40 mg by mouth at bedtime.   Not Taking at Unknown time  . traZODone (DESYREL) 100 MG tablet Take 200 mg by mouth at bedtime.   Not Taking at Unknown time    Patient Stressors: Marital or family conflict Medication change or noncompliance Traumatic event  Patient Strengths: Astronomerinancial means General fund of knowledge Physical Health  Treatment Modalities: Medication Management, Group therapy, Case management,  1 to 1 session with clinician, Psychoeducation, Recreational therapy.   Physician Treatment Plan for Primary Diagnosis: Schizophrenia, undifferentiated (HCC) Long Term Goal(s): Improvement in symptoms so  as ready for discharge NA   Short Term Goals: Ability to identify changes in lifestyle to reduce recurrence of condition will improve Ability to verbalize feelings will improve Ability to disclose and discuss suicidal ideas Ability to demonstrate self-control will improve Ability to identify and develop effective coping behaviors will improve Compliance with prescribed medications will improve Ability to identify triggers associated with substance abuse/mental health issues will improve NA  Medication Management: Evaluate patient's response, side effects, and tolerance of medication regimen.  Therapeutic Interventions: 1 to 1 sessions, Unit Group  sessions and Medication administration.  Evaluation of Outcomes: Progressing  Physician Treatment Plan for Secondary Diagnosis: Principal Problem:   Schizophrenia, undifferentiated (HCC) Active Problems:   Tobacco use disorder   Noncompliance   Dyslipidemia   Constipation  Long Term Goal(s): Improvement in symptoms so as ready for discharge NA   Short Term Goals: Ability to identify changes in lifestyle to reduce recurrence of condition will improve Ability to verbalize feelings will improve Ability to disclose and discuss suicidal ideas Ability to demonstrate self-control will improve Ability to identify and develop effective coping behaviors will improve Compliance with prescribed medications will improve Ability to identify triggers associated with substance abuse/mental health issues will improve NA     Medication Management: Evaluate patient's response, side effects, and tolerance of medication regimen.  Therapeutic Interventions: 1 to 1 sessions, Unit Group sessions and Medication administration.  Evaluation of Outcomes: Progressing   RN Treatment Plan for Primary Diagnosis: Schizophrenia, undifferentiated (HCC) Long Term Goal(s): Knowledge of disease and therapeutic regimen to maintain health will improve  Short Term Goals: Ability to remain free from injury will improve, Ability to verbalize frustration and anger appropriately will improve, Ability to demonstrate self-control, Ability to participate in decision making will improve and Ability to verbalize feelings will improve  Medication Management: RN will administer medications as ordered by provider, will assess and evaluate patient's response and provide education to patient for prescribed medication. RN will report any adverse and/or side effects to prescribing provider.  Therapeutic Interventions: 1 on 1 counseling sessions, Psychoeducation, Medication administration, Evaluate responses to treatment, Monitor vital  signs and CBGs as ordered, Perform/monitor CIWA, COWS, AIMS and Fall Risk screenings as ordered, Perform wound care treatments as ordered.  Evaluation of Outcomes: Progressing   LCSW Treatment Plan for Primary Diagnosis: Schizophrenia, undifferentiated (HCC) Long Term Goal(s): Safe transition to appropriate next level of care at discharge, Engage patient in therapeutic group addressing interpersonal concerns.  Short Term Goals: Engage patient in aftercare planning with referrals and resources, Increase social support, Increase ability to appropriately verbalize feelings and Increase emotional regulation  Therapeutic Interventions: Assess for all discharge needs, 1 to 1 time with Social worker, Explore available resources and support systems, Assess for adequacy in community support network, Educate family and significant other(s) on suicide prevention, Complete Psychosocial Assessment, Interpersonal group therapy.  Evaluation of Outcomes: Progressing   Progress in Treatment: Attending groups: No. Participating in groups: No. Taking medication as prescribed: Yes. Toleration medication: Yes. Family/Significant other contact made: Yes, individual(s) contacted:  guardian Patient understands diagnosis: Yes. Discussing patient identified problems/goals with staff: Yes. Medical problems stabilized or resolved: Yes. Denies suicidal/homicidal ideation: Yes. Issues/concerns per patient self-inventory: No. Other: n/a  New problem(s) identified: None identified at this time.   New Short Term/Long Term Goal(s): Patient unable to set goal at this time.   Discharge Plan or Barriers: Pt referred to [facility]. Patient is on waitlist.   Reason for Continuation of Hospitalization: Aggression Depression  Hallucinations  Estimated Length of Stay: 7 days.   Attendees: Patient:  06/07/2016 3:22 PM  Physician: Dr. Kristine Linea, MD 06/07/2016 3:22 PM  Nursing: Hulan Amato, RN 06/07/2016 3:22 PM  RN  Care Manager: 06/07/2016 3:22 PM  Social Worker: Fredrich Birks. Garnette Czech MSW, LCSWA 06/07/2016 3:22 PM  Recreational Therapist: Jacquelynn Cree, LRT/CTRS 06/07/2016 3:22 PM  Other:  06/07/2016 3:22 PM  Other:  06/07/2016 3:22 PM  Other: 06/07/2016 3:22 PM    Scribe for Treatment Team: Arelia Longest, LCSWA 06/07/2016 3:22 PM

## 2016-06-07 NOTE — Progress Notes (Signed)
D: Patient appears restless and fidgety.  He was a little irritable this morning when he was awakened for his medications, however, he has been pleasant and smiling this afternoon.  He continues to walk by the nurse's station and waves to staff.  He has an intense stare and was flirtatious with staff. His hygiene has improved, not sure about showering;  he has put on some of his own clothing.  He continues to report depressive symptoms; however denies any thoughts of self harm. Patient is observed mumbling to himself and appears preoccupied. He has been visible in the milieu, however, he is not going to group. A: Continue to monitor medication management and MD orders.  Safety checks completed every 15 minutes per protocol.  Offer support and encouragement as needed. R: Patient is cooperative; his behavior is appropriate.

## 2016-06-08 NOTE — [facility] Group Notes (Signed)
[facility] LCSW Group Therapy  06/08/2016 3:31 PM  Type of Therapy:  Group Therapy  Participation Level:  Minimal  Participation Quality:  Attentive  Affect:  Appropriate  Cognitive:  Alert and Hallucinating  Insight:  None  Engagement in Therapy:  Improving  Modes of Intervention:  Activity, Clarification, Discussion, Education, Exploration, Limit-setting, Problem-solving, Reality Testing, Socialization and Support  Summary of Progress/Problems: Coping Skills: Patients defined and discussed healthy coping skills. Patients identified healthy coping skills they would like to try during hospitalization and after discharge. CSW offered insight to varying coping skills that may have been new to patients such as practicing mindfulness. Patient was appropriate during group but was observed talking to himself. Patient did contribute to the group discussion some but walked out of group several times but would return.   Dorsey Charette G. Garnette CzechSampson MSW, LCSWA 06/08/2016, 3:32 PM

## 2016-06-08 NOTE — Plan of Care (Signed)
Problem: Coping: Goal: Ability to cope will improve Outcome: Progressing Patient is observed walking in the halls without evidence of responding to unseen others.

## 2016-06-08 NOTE — Progress Notes (Signed)
Patient displayed improved mood this evening compared to last weekend.  He continues to pace in the hallways but was not witnessed responding to unseen others.  He was appropriate in taking medications and waiting for his turn.  He acknowledged Clinical research associatewriter as his Engineer, civil (consulting)nurse instead of Teacher, musicignoring staff.  He was reported to have sat in the dayroom with peers watching a movie for approximately 30 minutes without pacing. Plan is to continue to assess and record improved mood and behavior.

## 2016-06-08 NOTE — Progress Notes (Signed)
Pt noted to have body odor present. Pt's linens were changed earlier today. Pt currently lying in bed and noted to be excessively sweating. Clean linens appear to be wet near pt's upper body. Pt enocuraged today to shower, but he was only observed changing his shirt. Earlier today, pt appeared agitated, going from lying in bed to pacing the unit, up and down most of the morning. PRN medication given for agitation and anxiety effective. Pt does not appear to be responding to internal stimuli, but is still short with answers. Pt is medication compliant. Denies SI/HI/AVH, pain. Good appetite. Support and encouragement provided. Medications administered as ordered with education. Safety maintained with every 15 minute checks. Will continue to monitor.
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
## 2016-06-08 NOTE — Plan of Care (Signed)
Problem: Health Behavior/Discharge Planning: Goal: Compliance with prescribed medication regimen will improve Outcome: Progressing Pt compliant with taking medications as scheduled and PRN medications when needed. Does not show any interest in learning about his medications. Support and encouragement provided. Education provided.

## 2016-06-09 MED ORDER — CLONIDINE HCL 0.1 MG PO TABS
0.1000 mg | ORAL_TABLET | Freq: Two times a day (BID) | ORAL | Status: DC | PRN
  Administered 2016-07-01: 0.1 mg via ORAL
  Filled 2016-06-09: qty 1

## 2016-06-09 NOTE — [facility] Group Notes (Signed)
[facility] LCSW Group Therapy  06/09/2016 3:56 PM  Type of Therapy:  Group Therapy  Participation Level:  Patient did not attend group. CSW invited patient to group.   Summary of Progress/Problems: Stages of Grief: Group facilitator defined grief and the stages of the grieving process. Facilitator offered support to patients and allowed patients to share openly about their experiences with grief. Patients explored how their individual experiences with grief have impacted their wellness and recovery. Each patient was challenged to recognize their obstacles in finding acceptance after experiencing a loss. Facilitator processed with patients new coping skills to overcome their challenges with grief. Group members received and gave support to group members.   Jeffrey [name] MSW, LCSWA 06/09/2016, 3:56 PM
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
## 2016-06-09 NOTE — Progress Notes (Signed)
This nurse had provided number this morning for Jeffrey [name] [phone]).  Pt noted to come out of his room, head to the phones. This nurse turned the phone on for patient to make a call in which he has been requesting to make all day. This nurse overheard pt saying "I need to get in touch with my family. They have me locked down here and I need to go to my mom's funeral. I am not at psych patient. I'm not mentally ill." Pt hung up the phone, and returned to his room. Soon after, hospital switch board called the unit to report pt was requesting discharge and wanted to call 911. Safety maintained. Will continue to monitor.

## 2016-06-09 NOTE — Plan of Care (Signed)
Problem: Safety: Goal: Ability to redirect hostility and anger into socially appropriate behaviors will improve Outcome: Progressing Patient is less pressured on contact and shows he is aware of the routine.

## 2016-06-09 NOTE — Progress Notes (Signed)
Patient was observed in and out of his room this evening.  On approach, he interacted for brief periods and demonstrates understanding time of day and unit routine.  He was not seen responding to unseen others but does seem preoccupied.  He did not ask about Mother or Mother's funeral this evening.

## 2016-06-09 NOTE — Progress Notes (Signed)
Patient was observed sleeping at the beginning of this shift and was difficult to arouse.  At snack time, he got out of bed and had snack.  He took his HS medications and went back to sleep.  He was appropriate went awake.

## 2016-06-09 NOTE — [facility] Group Notes (Signed)
[facility] Group Notes:  (Nursing/MHT/Case Management/Adjunct)  Date:  06/09/2016  Time:  6:02 AM  Type of Therapy:  Psychoeducational Skills  Participation Level:  Did Not Attend  Summary of Progress/Problems:  Jeffrey MilroyLaquanda Y Amani [name] 06/09/2016, 6:02 AM

## 2016-06-09 NOTE — Plan of Care (Signed)
Problem: Safety: Goal: Ability to remain free from injury will improve Outcome: Progressing Patient remains safe.  Patient contracts for safety this shift.

## 2016-06-09 NOTE — Progress Notes (Signed)
Patient woke at 0315 and approached the Nurse's Station.  He asked, "Do you know when my Mother's funeral is?"  When told no, he returned to bed.

## 2016-06-09 NOTE — Progress Notes (Signed)
Pt appears to be sad, depressed today. PRN medications given as ordered for anxiety/agitation. Multiple times, the patient talks about needing to leave and go to his mother's funeral. When this nurse assisted pt with calling Margaree MackintoshBoyd Boswell [phone], family member that answered requested not to be bothered by a phone call again regarding the patient because "we aren't responsible." Pt compliant with medications. Encouragement provided for pt to shower, but he still refuses. Linens changed again today due to obvious excessive sweating by patient. Body odor present. BP stable today. Appears to have a good appetite. Pt restless, pacing the unit most of the day, goes back and forth from pacing halls to lying in bed awake. Support and encouragement provided. Medications administered as ordered with education. Safety maintained with every 15 minute checks. Will continue to monitor.

## 2016-06-09 NOTE — Plan of Care (Signed)
Problem: Safety: Goal: Ability to redirect hostility and anger into socially appropriate behaviors will improve Outcome: Progressing Pt has had no behavior issues, but sometimes appears anxious/agitated.

## 2016-06-10 LAB — CBC WITH DIFFERENTIAL/PLATELET
BAND NEUTROPHILS: 0 %
BASOS PCT: 0 %
Basophils Absolute: 0 10*3/uL (ref 0–0.1)
Blasts: 0 %
EOS ABS: 0 10*3/uL (ref 0–0.7)
Eosinophils Relative: 0 %
HCT: 40.3 % (ref 40.0–52.0)
Hemoglobin: 14.1 g/dL (ref 13.0–18.0)
LYMPHS PCT: 30 %
Lymphs Abs: 2.2 10*3/uL (ref 1.0–3.6)
MCH: 29.7 pg (ref 26.0–34.0)
MCHC: 34.9 g/dL (ref 32.0–36.0)
MCV: 85.2 fL (ref 80.0–100.0)
METAMYELOCYTES PCT: 2 %
MONO ABS: 0.6 10*3/uL (ref 0.2–1.0)
MONOS PCT: 8 %
Myelocytes: 2 %
Neutro Abs: 4.6 10*3/uL (ref 1.4–6.5)
Neutrophils Relative %: 58 %
Other: 0 %
PLATELETS: 182 10*3/uL (ref 150–440)
PROMYELOCYTES ABS: 0 %
RBC: 4.73 MIL/uL (ref 4.40–5.90)
RDW: 13.1 % (ref 11.5–14.5)
WBC: 7.4 10*3/uL (ref 3.8–10.6)
nRBC: 0 /100 WBC

## 2016-06-10 LAB — VALPROIC ACID LEVEL: Valproic Acid Lvl: 133 ug/mL — ABNORMAL HIGH (ref 50.0–100.0)

## 2016-06-10 MED ORDER — DIVALPROEX SODIUM 500 MG PO DR TAB
1000.0000 mg | DELAYED_RELEASE_TABLET | Freq: Two times a day (BID) | ORAL | Status: DC
  Administered 2016-06-10 – 2016-06-20 (×20): 1000 mg via ORAL
  Filled 2016-06-10 (×20): qty 2

## 2016-06-10 NOTE — Progress Notes (Signed)
MEDICATION RELATED CONSULT NOTE - FOLLOW UP   Pharmacy Consult for Clozapine Monitoring  Indication: Schizophrenia  No Known Allergies  Patient Measurements: Height: 5\' 8"  (172.7 cm) Weight: 177 lb (80.3 kg) IBW/kg (Calculated) : 68.4   Vital Signs: Temp: 97.9 F (36.6 C) (05/21 0717) Temp Source: Oral (05/21 0717) BP: 102/57 (05/21 0717) Pulse Rate: 74 (05/21 0717)  Labs:  Recent Labs  06/10/16 1029  WBC 7.4  HGB 14.1  HCT 40.3  PLT 182   Estimated Creatinine Clearance: 102.1 mL/min (by C-G formula based on SCr of 0.75 mg/dL).   5/21:  ANC= 4.6  Plan:  Patient registered and eligible with [facility] under Dr. Toni Amendlapacs  [id number] [phone] Patient lab information submitted to Clozapine [facility] program.  Next ANC due 06/17/16 while hospitalized    Bari MantisKristin Jamaris Theard PharmD Clinical Pharmacist 06/10/2016

## 2016-06-10 NOTE — Progress Notes (Signed)
Pt observed to be in bed most of the day. Appears to be lying in bed with eyes closed when approached. Short with interactions. Allowed nurse to wash/dry his clothes. Strong encouragement provided for pt to shower before putting his clean clothes back on. Pt smells strongly of body odor and urine, continues to wear dirty underwear. Linens were stripped from bed this morning, but pt has yet to allow staff to put clean linens back onto bed. No tearful episode observed today. Pt is medication compliant. Safety maintained with every 15 minute checks. Will continue to monitor.

## 2016-06-10 NOTE — [facility] Group Notes (Signed)
[facility] Group Notes:  (Nursing/MHT/Case Management/Adjunct)  Date:  06/10/2016  Time:  4:14 AM  Type of Therapy:  Group Therapy  Participation Level:  Did Not Attend    Summary of Progress/Problems:  Veva Holesshley Imani Therasa Lorenzi 06/10/2016, 4:14 AM
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
## 2016-06-10 NOTE — [facility] Group Notes (Signed)
[facility] Group Notes:  (Nursing/MHT/Case Management/Adjunct)  Date:  06/10/2016  Time:  5:33 PM  Type of Therapy:  Psychoeducational Skills  Participation Level:  Did Not Attend  Jeffrey [name] 06/10/2016, 5:36 PM

## 2016-06-10 NOTE — Plan of Care (Signed)
Problem: Coping: Goal: Ability to demonstrate self-control will improve Outcome: Progressing No behavioral outbursts noted. Pt does interact very shortly. Support and encouragement provided and pt will allow.

## 2016-06-10 NOTE — Social Work (Signed)
CSW spoke with patient's legal guardian, Jeffrey AbeRon [name] ph#: [phone]. Pt's guardian stated that he will meet with patient and treatment team on Wednesday, 5/23. Guardian stated that he will also be in contact with group homes that may be able to meet with patient and care for him properly.   Jeffrey AbbotKadijah Tannisha [name], MSW, LCSW-A 06/10/2016, 10:51 AM

## 2016-06-11 NOTE — Progress Notes (Signed)
Patient ID: Jeffrey RevelsMichael T Cahue, male   DOB: 1960-03-09, 56 y.o.   MRN: [id number] Disorganized in thoughts, odd and bizarre, poorly kept appearance, speech mumbled and difficult to understand, answers yes/no questions and walks away, poor eye contact, limited interaction with peers, although visible in the milieu, in and out of the day room, medication compliant, no aggressive behavior during this shift.

## 2016-06-11 NOTE — Progress Notes (Signed)
D: Pt A & O to self and place only. Presents guarded, irritable and disorganized on interactions. Speech and pressured, interactions is brief and on as need basis. Denies SI, HI, AVH and pain when assessed, "I just need my medicine to sleep".  Pt does appears disheveled with body odor, encouraged to shower and change clothes but to no avail. Observed yelling intermittently in his room on approach this AM. Came up to staff this afternoon requesting to "have the funeral home call me, please, they have mama's body".  A: Emotional support and availability provided to pt. Scheduled and PRN (Ativan--emar) medications given as per MD's orders, effects monitored. Q 15 minutes checks maintained.  R: Pt remains medication compliant. Tolerates all PO intake well. Did not attend groups this shift. POC continues for safety and mood stability.
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
## 2016-06-11 NOTE — Plan of Care (Signed)
Problem: Activity: Goal: Sleeping patterns will improve Outcome: Progressing Patient slept for Estimated Hours of 8; every 15 minutes safety round maintained, no injury or falls during this shift.    

## 2016-06-11 NOTE — Plan of Care (Signed)
Problem: Safety: Goal: Ability to remain free from injury will improve Outcome: Progressing Pt maintained on Q 15 minutes observation without self harm gestures or outburst to note thus far.   Problem: Education: Goal: Emotional status will improve Outcome: Not Progressing Pt presents with depressed mood and affect. Isolative to room, irritable and guarded for majority of this shift. Support and encouragement provided.

## 2016-06-12 NOTE — [facility] Group Notes (Signed)
[facility] Group Notes:  (Nursing/MHT/Case Management/Adjunct)  Date:  06/12/2016  Time:  4:48 PM  Type of Therapy:  Psychoeducational Skills  Participation Level:  None  Participation Quality:  Inattentive  Affect:  Flat  Cognitive:  Oriented  Insight:  Limited  Engagement in Group:  None  Modes of Intervention:  Discussion and Education  Summary of Progress/Problems:  Twanna Hymanda C Jorah Hua 06/12/2016, 4:48 PM

## 2016-06-12 NOTE — Tx Team (Signed)
Interdisciplinary Treatment and Diagnostic Plan Update  06/12/2016 Time of Session: 10:30am Jeffrey RevelsMichael T [name] MRN: [id number]  Principal Diagnosis: Schizophrenia, undifferentiated (HCC)  Secondary Diagnoses: Principal Problem:   Schizophrenia, undifferentiated (HCC) Active Problems:   Tobacco use disorder   Noncompliance   Dyslipidemia   Constipation   Current Medications:  Current Facility-Administered Medications  Medication Dose Route Frequency Provider Last Rate Last Dose  . acetaminophen (TYLENOL) tablet 650 mg  650 mg Oral Q6H PRN Clapacs, John T, MD      . alum & mag hydroxide-simeth (MAALOX/MYLANTA) 200-200-20 MG/5ML suspension 30 mL  30 mL Oral Q4H PRN Clapacs, John T, MD      . chlorproMAZINE (THORAZINE) tablet 50 mg  50 mg Oral TID PRN Pucilowska, [name], MD   50 mg at 06/09/16 1359  . cloNIDine (CATAPRES) tablet 0.1 mg  0.1 mg Oral BID PRN Darliss RidgelKapur, Aarti K, MD      . cloZAPine (CLOZARIL) tablet 100 mg  100 mg Oral Daily Clapacs, Jackquline DenmarkJohn T, MD   100 mg at 06/12/16 [id number]  . cloZAPine (CLOZARIL) tablet 400 mg  400 mg Oral QHS Clapacs, Jackquline DenmarkJohn T, MD   400 mg at 06/11/16 2207  . divalproex (DEPAKOTE) DR tablet 1,000 mg  1,000 mg Oral Q12H Pucilowska, [name], MD   1,000 mg at 06/12/16 0927  . LORazepam (ATIVAN) tablet 2 mg  2 mg Oral Q4H PRN Pucilowska, [name], MD   2 mg at 06/11/16 0907  . magnesium hydroxide (MILK OF MAGNESIA) suspension 30 mL  30 mL Oral Daily PRN Clapacs, John T, MD      . metFORMIN (GLUCOPHAGE) tablet 500 mg  500 mg Oral BID WC Pucilowska, [name], MD   500 mg at 06/12/16 0927  . senna (SENOKOT) tablet 17.2 mg  2 tablet Oral QHS Clapacs, Jackquline DenmarkJohn T, MD   17.2 mg at 06/11/16 2207  . simvastatin (ZOCOR) tablet 40 mg  40 mg Oral QHS Clapacs, Jackquline DenmarkJohn T, MD   40 mg at 06/11/16 2207  . traZODone (DESYREL) tablet 200 mg  200 mg Oral QHS Clapacs, Jackquline DenmarkJohn T, MD   200 mg at 06/11/16 2207   PTA Medications: Prescriptions Prior to Admission  Medication Sig Dispense Refill  Last Dose  . cloZAPine (CLOZARIL) 100 MG tablet Take 1-4 tablets (100-400 mg total) by mouth 2 (two) times daily. Patient takes 1 tablet (100 mg) in the morning and 4 tablets (400 mg) at bedtime. (Patient not taking: Reported on 04/26/2016) 150 tablet 0 Not Taking at Unknown time  . paliperidone (INVEGA) 6 MG 24 hr tablet Take 6 mg by mouth 2 (two) times daily.   Not Taking at Unknown time  . senna (SENOKOT) 8.6 MG TABS tablet Take 2 tablets (17.2 mg total) by mouth at bedtime. (Patient not taking: Reported on 04/26/2016) 120 each 0 Not Taking at Unknown time  . simvastatin (ZOCOR) 40 MG tablet Take 40 mg by mouth at bedtime.   Not Taking at Unknown time  . traZODone (DESYREL) 100 MG tablet Take 200 mg by mouth at bedtime.   Not Taking at Unknown time    Patient Stressors: Marital or family conflict Medication change or noncompliance Traumatic event  Patient Strengths: Astronomerinancial means General fund of knowledge Physical Health  Treatment Modalities: Medication Management, Group therapy, Case management,  1 to 1 session with clinician, Psychoeducation, Recreational therapy.   Physician Treatment Plan for Primary Diagnosis: Schizophrenia, undifferentiated (HCC) Long Term Goal(s): Improvement in symptoms so as ready for  discharge NA   Short Term Goals: Ability to identify changes in lifestyle to reduce recurrence of condition will improve Ability to verbalize feelings will improve Ability to disclose and discuss suicidal ideas Ability to demonstrate self-control will improve Ability to identify and develop effective coping behaviors will improve Compliance with prescribed medications will improve Ability to identify triggers associated with substance abuse/mental health issues will improve NA  Medication Management: Evaluate patient's response, side effects, and tolerance of medication regimen.  Therapeutic Interventions: 1 to 1 sessions, Unit Group sessions and Medication  administration.  Evaluation of Outcomes: Progressing  Physician Treatment Plan for Secondary Diagnosis: Principal Problem:   Schizophrenia, undifferentiated (HCC) Active Problems:   Tobacco use disorder   Noncompliance   Dyslipidemia   Constipation  Long Term Goal(s): Improvement in symptoms so as ready for discharge NA   Short Term Goals: Ability to identify changes in lifestyle to reduce recurrence of condition will improve Ability to verbalize feelings will improve Ability to disclose and discuss suicidal ideas Ability to demonstrate self-control will improve Ability to identify and develop effective coping behaviors will improve Compliance with prescribed medications will improve Ability to identify triggers associated with substance abuse/mental health issues will improve NA     Medication Management: Evaluate patient's response, side effects, and tolerance of medication regimen.  Therapeutic Interventions: 1 to 1 sessions, Unit Group sessions and Medication administration.  Evaluation of Outcomes: Progressing   RN Treatment Plan for Primary Diagnosis: Schizophrenia, undifferentiated (HCC) Long Term Goal(s): Knowledge of disease and therapeutic regimen to maintain health will improve  Short Term Goals: Ability to remain free from injury will improve, Ability to verbalize frustration and anger appropriately will improve, Ability to demonstrate self-control, Ability to participate in decision making will improve and Ability to verbalize feelings will improve  Medication Management: RN will administer medications as ordered by provider, will assess and evaluate patient's response and provide education to patient for prescribed medication. RN will report any adverse and/or side effects to prescribing provider.  Therapeutic Interventions: 1 on 1 counseling sessions, Psychoeducation, Medication administration, Evaluate responses to treatment, Monitor vital signs and CBGs as  ordered, Perform/monitor CIWA, COWS, AIMS and Fall Risk screenings as ordered, Perform wound care treatments as ordered.  Evaluation of Outcomes: Progressing   LCSW Treatment Plan for Primary Diagnosis: Schizophrenia, undifferentiated (HCC) Long Term Goal(s): Safe transition to appropriate next level of care at discharge, Engage patient in therapeutic group addressing interpersonal concerns.  Short Term Goals: Engage patient in aftercare planning with referrals and resources, Increase social support, Increase ability to appropriately verbalize feelings and Increase emotional regulation  Therapeutic Interventions: Assess for all discharge needs, 1 to 1 time with Social worker, Explore available resources and support systems, Assess for adequacy in community support network, Educate family and significant other(s) on suicide prevention, Complete Psychosocial Assessment, Interpersonal group therapy.  Evaluation of Outcomes: Progressing   Progress in Treatment: Attending groups: No. Participating in groups: No. Taking medication as prescribed: Yes. Toleration medication: Yes. Family/Significant other contact made: Yes, individual(s) contacted:  guardian Patient understands diagnosis: Yes. Discussing patient identified problems/goals with staff: Yes. Medical problems stabilized or resolved: Yes. Denies suicidal/homicidal ideation: Yes. Issues/concerns per patient self-inventory: No. Other: n/a  New problem(s) identified: None identified at this time.   New Short Term/Long Term Goal(s): Patient unable to set goal at this time.   Discharge Plan or Barriers: [facility] Patient is on [facility].   Reason for Continuation of Hospitalization: Aggression Depression Hallucinations  Estimated  Length of Stay: 7 days.   Attendees: Patient:  06/12/2016 11:57 AM  Physician: Dr. Kristine Linea, MD 06/12/2016 11:57 AM  Nursing: Leonia Reader, BSN, RN 06/12/2016 11:57 AM  RN Care  Manager: 06/12/2016 11:57 AM  Social Worker: Hampton Abbot, MSW, LCSW-A 06/12/2016 11:57 AM  Recreational Therapist: Jacquelynn Cree, LRT/CTRS 06/12/2016 11:57 AM  Other:  06/12/2016 11:57 AM  Other:  06/12/2016 11:57 AM  Other: 06/12/2016 11:57 AM    Scribe for Treatment Team: Lynden Oxford, LCSWA 06/12/2016 11:57 AM

## 2016-06-12 NOTE — [facility] Group Notes (Signed)
[facility] Group Notes:  (Nursing/MHT/Case Management/Adjunct)  Date:  06/12/2016  Time:  9:50 PM  Type of Therapy:  Group Therapy  Participation Level:  Active  Participation Quality:  Appropriate  Affect:  Appropriate  Cognitive:  Appropriate  Insight:  Appropriate  Engagement in Group:  Improving  Modes of Intervention:  Support  Summary of Progress/Problems:  Jeffrey EkJanice Marie Tollie [name] 06/12/2016, 9:50 PM

## 2016-06-12 NOTE — Progress Notes (Signed)
D: Pt denies SI/HI/AVH, affect is flat mood is labile, angry and agitated towards staff. Patient was noted to have a strong body odor. Patient's room also was noted to smell like urine was on the floor and environmental services was called to clean it. Patient is hostile towards staff and he is not receptive towards treatment. Patient appears anxious and he is not  interacting with peers and staff appropriately.  A: Pt was offered support and encouragement. Pt was given scheduled medications. Pt was encouraged to attend groups. Q 15 minute checks were done for safety.  R:Pt attends groups and interacts well with peers and staff. Pt is taking medication. Pt has no complaints.Pt receptive to treatment and safety maintained on unit.

## 2016-06-12 NOTE — Social Work (Signed)
Pt's legal guardian, Corwin LevinsRon Hardie ph#: [phone] came to see patient to discuss patient's concerns. Patient refused to meet with guardian. CSW discussed patient's discharge plans and barriers with legal guardian - will follow-up with guardian Friday to discuss further.  Guardian stated that pt's funds are currently frozen and cannot pursue placement at this time.  Hampton AbbotKadijah Alane Hanssen, MSW, LCSW-A 06/12/2016, 2:09PM

## 2016-06-12 NOTE — Progress Notes (Signed)
Affect blunted.  Forwards little information.  Easily irritated.  Paces halls.  Stays to self.  Poor hygiene.  Support and encouragement offered.  Safety maintained.

## 2016-06-12 NOTE — [facility] Group Notes (Signed)
[facility] LCSW Group Therapy  06/12/2016 1pm  Type of Therapy: Group Therapy  Participation Level: Invited, chose not to attend  Summary of Progress/Problems: The topic for group therapy emotion regulation  Turrell Severt LCSW  
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
## 2016-06-12 NOTE — Plan of Care (Signed)
Problem: Education: Goal: Will be free of psychotic symptoms Outcome: Not Progressing Patient is still noted responding to internal stimuli.

## 2016-06-13 LAB — CLOZAPINE (CLOZARIL)
Clozapine Lvl: 424 ng/mL (ref 350–650)
NorClozapine: 134 ng/mL
Total(Cloz+Norcloz): 558 ng/mL

## 2016-06-13 NOTE — Plan of Care (Signed)
Problem: Education: Goal: Will be free of psychotic symptoms Outcome: Progressing Patient appears less psychotic not noted responding to internal stimuli.

## 2016-06-13 NOTE — Progress Notes (Signed)
Denies SI/HI/AVH.  Support and encouragement offered.  Safety maintained.   

## 2016-06-13 NOTE — [facility] Group Notes (Signed)
[facility] LCSW Group Therapy  06/13/2016 1:36 PM  Type of Therapy:  Group Therapy  Participation Level:  Patient did not attend group. CSW invited patient to group.   Summary of Progress/Problems: Balance in life: Patients will discuss the concept of balance and how it looks and feels to be unbalanced. Pt will identify areas in their life that is unbalanced and ways to become more balanced. They discussed what aspects in their lives has influenced their self care. Patients also discussed self care in the areas of self regulation/control, hygiene/appearance, sleep/relaxation, healthy leisure, healthy eating habits, exercise, inner peace/spirituality, self improvement, sobriety, and health management. They were challenged to identify changes that are needed in order to improve self care.  Ena Demary G. Garnette CzechSampson MSW, LCSWA 06/13/2016, 1:36 PM
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
## 2016-06-13 NOTE — Progress Notes (Signed)
D: Pt denies SI/HI/AVH. Pt is pleasant and cooperative, affect is flat, thoughts are more organized no bizarre behavior noted. Pt appears less anxious, not socializing with peers, not  interacting with peers and staff appropriately.  A: Pt was offered support and encouragement. Pt was given scheduled medications. Pt was encouraged to attend groups. Q 15 minute checks were done for safety.  R:Pt attends groups, not interacting with peers and staff appropriately.  Pt is complaint with medication. Pt has no complaints.Pt receptive to treatment and safety maintained on unit.

## 2016-06-13 NOTE — [facility] Group Notes (Signed)
[facility] LCSW Group Therapy Note  Type of Therapy and Topic:  Group Therapy:  Goals Group: SMART Goals  Participation Level:  Patient did not attend group. CSW invited patient to group.   Description of Group:   The purpose of a daily goals group is to assist and guide patients in setting recovery/wellness-related goals.  The objective is to set goals as they relate to the crisis in which they were admitted. Patients will be using SMART goal modalities to set measurable goals.  Characteristics of realistic goals will be discussed and patients will be assisted in setting and processing how one will reach their goal. Facilitator will also assist patients in applying interventions and coping skills learned in psycho-education groups to the SMART goal and process how one will achieve defined goal.  Therapeutic Goals: -Patients will develop and document one goal related to or their crisis in which brought them into treatment. -Patients will be guided by LCSW using SMART goal setting modality in how to set a measurable, attainable, realistic and time sensitive goal.  -Patients will process barriers in reaching goal. -Patients will process interventions in how to overcome and successful in reaching goal.   Summary of Patient Progress:  Patient Goal: Patient did not attend group. CSW invited patient to group.    Therapeutic Modalities:   Motivational Interviewing Engineer, manufacturing systemsCognitive Behavioral Therapy Crisis Intervention Model SMART goals setting  Taiyana Kissler G. Garnette CzechSampson MSW, LCSWA 06/13/2016 10:09 AM

## 2016-06-14 NOTE — Plan of Care (Signed)
Problem: Activity: Goal: Sleeping patterns will improve Outcome: Progressing Patient slept for Estimated Hours of 5.15; every 15 minutes safety round maintained, no injury or falls during this shift.    

## 2016-06-14 NOTE — Plan of Care (Signed)
Problem: Safety: Goal: Ability to redirect hostility and anger into socially appropriate behaviors will improve Outcome: Not Progressing Patient has exhibited aggressive behaviors toward his peers.  He remains a 1:1 observation for same.
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
## 2016-06-14 NOTE — [facility] Group Notes (Signed)
[facility] Group Notes:  (Nursing/MHT/Case Management/Adjunct)  Date:  06/14/2016  Time:  6:17 AM  Type of Therapy:  Psychoeducational Skills  Participation Level:  Minimal  Participation Quality:  Appropriate  Affect:  Appropriate  Cognitive:  Appropriate  Insight:  Limited  Engagement in Group:  Limited  Modes of Intervention:  Discussion, Socialization and Support  Summary of Progress/Problems:  Jeffrey MilroyLaquanda Y Dontell [name] 06/14/2016, 6:17 AM
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
## 2016-06-15 NOTE — Progress Notes (Signed)
Patient continues to be on 1:1 no distress noted.  

## 2016-06-15 NOTE — Plan of Care (Signed)
Problem: Coping: Goal: Ability to verbalize feelings will improve Outcome: Progressing Patient verbalized feelings to staff.    
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
## 2016-06-15 NOTE — Progress Notes (Signed)
On 1:1 no distress noted , no aggressive behavior noted, in dayroom for snacks will continue to monitor.
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
## 2016-06-15 NOTE — Progress Notes (Signed)
Patient continues to be on 1:1 no distress noted, will continue to monitor.  

## 2016-06-15 NOTE — [facility] Group Notes (Signed)
[facility] Group Notes:  (Nursing/MHT/Case Management/Adjunct)  Date:  06/15/2016  Time:  5:12 AM  Type of Therapy:  Psychoeducational Skills  Participation Level:  Active  Participation Quality:  Appropriate  Affect:  Appropriate  Cognitive:  Appropriate  Insight:  Appropriate  Engagement in Group:  Engaged  Modes of Intervention:  Discussion, Socialization and Support  Summary of Progress/Problems:  Chancy MilroyLaquanda Y Landyn Lorincz 06/15/2016, 5:12 AM

## 2016-06-15 NOTE — [facility] Group Notes (Signed)
[facility] LCSW Group Therapy  06/15/2016 3:03 PM  Type of Therapy:  Group Therapy  Participation Level:  Patient did not attend group. CSW invited patient to group.    Summary of Progress/Problems: Coping Skills: Patients defined and discussed healthy coping skills. Patients identified healthy coping skills they would like to try during hospitalization and after discharge. CSW offered insight to varying coping skills that may have been new to patients such as practicing mindfulness.  Bayley Hurn G. Garnette CzechSampson MSW, LCSWA 06/15/2016, 3:03 PM
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
## 2016-06-15 NOTE — Progress Notes (Signed)
On 1:1 no distress noted, resting in bed with eyes closed.

## 2016-06-15 NOTE — Progress Notes (Signed)
Patient continues to be on 1: 1 no distress noted , resting quietly in bed.  

## 2016-06-15 NOTE — Plan of Care (Signed)
Problem: Self-Care: Goal: Ability to participate in self-care as condition permits will improve Outcome: Not Progressing Remains malodorous, disheveled.

## 2016-06-15 NOTE — Progress Notes (Signed)
Patient continues to be on 1:1 no distress noted, sleeping soundly.

## 2016-06-15 NOTE — Progress Notes (Signed)
Patient is on 1:1 no distress noted, laying in bed with eyes closed will continue to monitor.

## 2016-06-15 NOTE — Progress Notes (Signed)
Patient in bed asleep no distress noted, continued to be on 1:1, will continue to monitor.

## 2016-06-16 NOTE — [facility] Group Notes (Signed)
[facility] LCSW Group Therapy  06/16/2016 2:47 PM  Type of Therapy:  Group Therapy  Participation Level:  Did Not Attend  Summary of Progress/Problems: Communications: Patients identify how individuals communicate with one another appropriately and inappropriately. Patients will be guided to discuss their thoughts, feelings, and behaviors related to barriers when communicating. The group will process together ways to execute positive and appropriate communications.   Leeandra Ellerson G. Garnette CzechSampson MSW, LCSWA 06/16/2016, 2:47 PM

## 2016-06-16 NOTE — Plan of Care (Signed)
Problem: Role Relationship: Goal: Ability to interact with others will improve Outcome: Progressing Isolates to room.  No interaction with peers when out in the milieu.

## 2016-06-16 NOTE — Progress Notes (Signed)
Patient delusional.  Irritable.  Patient presented to nurses station asking about his discharge.  States "I don't want the police to pick me up so thaty can take me anywhere, I want my family to.  I have a condominium that my momma left me after she died and that was just recently. "  Support offered.  Safety checks maintained.

## 2016-06-16 NOTE — Plan of Care (Signed)
Problem: Coping: Goal: Ability to verbalize feelings will improve Outcome: Progressing Patient has paced in the hallway and has spent brief periods in the dayroom watching the ball game with peers.  He continues to avoid eye contact and does not engage in assessment.  However, he has approached Clinical research associatewriter 3-4 times this evening making statements about discharge or asking about "who is going to talk to the police for him".  After going to sleep, he came out of the room, asked for writer and announced that "I just received a phone call that my ride will be here at 0505 to pick me up and you need to tell doctor P".  Each time he was given reality information and he walked away toward his room. There were no acts of aggression toward others this evening and Patient remains safe.
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
## 2016-06-16 NOTE — [facility] Group Notes (Signed)
[facility] Group Notes:  (Nursing/MHT/Case Management/Adjunct)  Date:  06/16/2016  Time:  12:02 AM  Type of Therapy:  Group Therapy  Participation Level:  Did Not Attend  Participation Quality: Summary of Progress/Problems:  Jeffrey NeerJackie L Leonetta Mcgivern 06/16/2016, 12:02 AM

## 2016-06-17 LAB — CBC WITH DIFFERENTIAL/PLATELET
Basophils Absolute: 0 10*3/uL (ref 0–0.1)
Basophils Relative: 1 %
EOS PCT: 0 %
Eosinophils Absolute: 0 10*3/uL (ref 0–0.7)
HCT: 41.8 % (ref 40.0–52.0)
Hemoglobin: 14.7 g/dL (ref 13.0–18.0)
LYMPHS ABS: 1.9 10*3/uL (ref 1.0–3.6)
LYMPHS PCT: 25 %
MCH: 29.7 pg (ref 26.0–34.0)
MCHC: 35.1 g/dL (ref 32.0–36.0)
MCV: 84.6 fL (ref 80.0–100.0)
MONO ABS: 0.8 10*3/uL (ref 0.2–1.0)
MONOS PCT: 10 %
Neutro Abs: 4.9 10*3/uL (ref 1.4–6.5)
Neutrophils Relative %: 64 %
PLATELETS: 198 10*3/uL (ref 150–440)
RBC: 4.94 MIL/uL (ref 4.40–5.90)
RDW: 13.5 % (ref 11.5–14.5)
WBC: 7.6 10*3/uL (ref 3.8–10.6)

## 2016-06-17 NOTE — [facility] Group Notes (Signed)
[facility] Group Notes:  (Nursing/MHT/Case Management/Adjunct)  Date:  06/17/2016  Time:  12:11 AM  Type of Therapy:  Group Therapy  Participation Level:  Active  Participation Quality:  Appropriate  Affect:  Appropriate  Cognitive:  Appropriate  Insight:  Appropriate  Engagement in Group:  Engaged  Modes of Intervention:  Discussion  Summary of Progress/Problems:  Jeffrey [name] 06/17/2016, 12:11 AM
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
## 2016-06-17 NOTE — Progress Notes (Signed)
[name] has been more isolative today.  Out to eat breakfast and dinner only.    Refused am labs and to have vital signs taken.  Denies SI/HI/AVH. Guarded and answers few questions.  Safety maintained with 15 min rounding.

## 2016-06-17 NOTE — Plan of Care (Signed)
Problem: Self-Care: Goal: Ability to participate in self-care as condition permits will improve Outcome: Not Progressing Disheveled, notable body odor

## 2016-06-17 NOTE — Progress Notes (Signed)
MEDICATION RELATED CONSULT NOTE - FOLLOW UP   Pharmacy Consult for Clozapine Monitoring  Indication: Schizophrenia  No Known Allergies  Patient Measurements: Height: 5\' 8"  (172.7 cm) Weight: 177 lb (80.3 kg) IBW/kg (Calculated) : 68.4   Vital Signs:    Labs:  Recent Labs  06/17/16 1503  WBC 7.6  HGB 14.7  HCT 41.8  PLT 198   CrCl cannot be calculated (Patient's most recent lab result is older than the maximum 21 days allowed.).   5/28:  ANC= 4.9  Plan:  Patient registered and eligible with [facility] under Dr. Toni Amendlapacs  [id number] Zip [phone] Patient lab information submitted to Cl[facility] program.  Next ANC due 06/24/16 while hospitalized  Horris LatinoHolly Demontre Padin, PharmD Pharmacy Resident 06/17/2016 3:39 PM
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
## 2016-06-18 NOTE — Progress Notes (Signed)
Recreation Therapy Notes  Date: 05.29.18 Time: 9:30 am Location: Craft Room  Group Topic: Self-expression  Goal Area(s) Addresses:  Patient will identify one color per emotion listed on wheel. Patient will verbalize benefit of using art as a means of self-expression. Patient will verbalize one emotion experienced during session. Patient will be educated on other forms of self-expression.  Behavioral Response: Did not attend  Intervention: Emotion Wheel  Activity: Patients were given an Emotion Wheel worksheet and were instructed to pick a color for each emotion listed on the wheel.  Education: LRT educated patients on other forms of self-expression.   Education Outcome: Patient did not attend group.  Clinical Observations/Feedback: Patient did not attend group.  Jacquelynn CreeGreene,Marymargaret Kirker M, LRT/CTRS 06/18/2016 10:07 AM

## 2016-06-18 NOTE — [facility] Group Notes (Signed)
[facility] LCSW Group Therapy Note  Date/Time: 06/18/16, 1500  Type of Therapy/Topic:  Group Therapy:  Feelings about Diagnosis  Participation Level:  Did Not Attend   Mood:   Description of Group:    This group will allow patients to explore their thoughts and feelings about diagnoses they have received. Patients will be guided to explore their level of understanding and acceptance of these diagnoses. Facilitator will encourage patients to process their thoughts and feelings about the reactions of others to their diagnosis, and will guide patients in identifying ways to discuss their diagnosis with significant others in their lives. This group will be process-oriented, with patients participating in exploration of their own experiences as well as giving and receiving support and challenge from other group members.   Therapeutic Goals: 1. Patient will demonstrate understanding of diagnosis as evidence by identifying two or more symptoms of the disorder:  2. Patient will be able to express two feelings regarding the diagnosis 3. Patient will demonstrate ability to communicate their needs through discussion and/or role plays  Summary of Patient Progress:        Therapeutic Modalities:   Cognitive Behavioral Therapy Brief Therapy Feelings Identification   Greg Kristene Liberati, LCSW 

## 2016-06-18 NOTE — Progress Notes (Signed)
Pt continues to display psychosis with irritable mood. Withdrawn and isolative to room. Pt does not participate in unit groups and activities. Pt is up at times frequently asking to use phone. Pt is however compliant with all medications. Pt has poor hygiene refuses to perform independent care. Pt encouraged by RN and staff to perform hygiene. Will continue to monitor.
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
## 2016-06-18 NOTE — [facility] Group Notes (Signed)
[facility] Group Notes:  (Nursing/MHT/Case Management/Adjunct)  Date:  06/18/2016  Time:  5:12 AM  Type of Therapy:  Group Therapy  Participation Level:  Did Not Attend    Jeffrey [name] 06/18/2016, 5:12 AM

## 2016-06-18 NOTE — Progress Notes (Signed)
D: Pt denies SI/HI/AVH but noted responding to internal stimuli. Pt is restless but cooperative with treatment, patient's thoughts are less disorganized, he appears less anxious and he is interacting with peers and staff appropriately.  A: Pt was offered support and encouragement. Pt was given scheduled medications. Pt was encouraged to attend groups. Q 15 minute checks were done for safety.  R:Pt attends groups and interacts well with peers and staff. Pt is compliant with medication. Pt has no complaints.Pt receptive to treatment and safety maintained on unit.

## 2016-06-18 NOTE — Plan of Care (Signed)
Problem: Coping: Goal: Ability to verbalize feelings will improve Outcome: Progressing Patient verbalized feelings to staff.    

## 2016-06-19 NOTE — [facility] Group Notes (Signed)
[facility] Group Notes:  (Nursing/MHT/Case Management/Adjunct)  Date:  06/19/2016  Time:  12:30 AM  Type of Therapy:  Group Therapy  Participation Level:  Active  Participation Quality:  Sharing  Affect:  Appropriate  Cognitive:  Alert  Insight:  Appropriate  Engagement in Group:  Engaged  Modes of Intervention:  Support and This time he stay until group was over.  Summary of Progress/Problems:  Mayra NeerJackie L Cheyann Blecha 06/19/2016, 12:30 AM
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
## 2016-06-19 NOTE — Progress Notes (Signed)
This morning pt had to be encouraged by RN several times to get up and eat breakfast.Pt compliant with depakote, clozaril, and metformin. Pt continues to be withdrawn and  isolative to room.Pt seems to be in a better mood and more polite towards staff. Pt continues to refuse to perform hygiene daily.Pt does not participate in groups and activities.

## 2016-06-19 NOTE — Plan of Care (Signed)
Problem: Coping: Goal: Ability to verbalize feelings will improve Outcome: Progressing Patient verbalized feelings to staff.    

## 2016-06-19 NOTE — Social Work (Signed)
[facility] ACT Team Lead, [name] met with patient briefly. Patient did not want to get up and meet with ACT Team at this time.   Glorious Peach, MSW, LCSW-A 06/19/2016, 2:15PM

## 2016-06-19 NOTE — Progress Notes (Signed)
D: Pt denies SI/HI/AVH. Pt is pleasant and cooperative, affect is flat but brightens upon approach. Patient is less disorganized, appears less anxious and he is interacting with peers and staff appropriately.  A: Pt was offered support and encouragement. Pt was given scheduled medications. Pt was encouraged to attend groups. Q 15 minute checks were done for safety.  R:Pt attends groups and interacts well with peers and staff. Pt is taking medication. Pt has no complaints.Pt receptive to treatment and safety maintained on unit.

## 2016-06-19 NOTE — Plan of Care (Signed)
Problem: Nutritional: Goal: Ability to achieve adequate nutritional intake will improve Outcome: Progressing Pt ate breakfast and dinner today with much encouragement from RN.

## 2016-06-19 NOTE — [facility] Group Notes (Signed)
[facility] LCSW Group Therapy  06/19/2016 1:55 PM  Type of Therapy:  Group Therapy  Participation Level:  Patient did not attend group. CSW invited patient to group.   Summary of Progress/Problems: Emotional Regulation: Patients will identify both negative and positive emotions. They will discuss emotions they have difficulty regulating and how they impact their lives. Patients will be asked to identify healthy coping skills to combat unhealthy reactions to negative emotions.   Jeffrey Magallon G. Garnette CzechSampson MSW, LCSWA 06/19/2016, 1:55 PM

## 2016-06-20 LAB — VALPROIC ACID LEVEL: VALPROIC ACID LVL: 92 ug/mL (ref 50.0–100.0)

## 2016-06-20 LAB — AMMONIA: Ammonia: 31 umol/L (ref 9–35)

## 2016-06-20 MED ORDER — DIVALPROEX SODIUM 500 MG PO DR TAB
500.0000 mg | DELAYED_RELEASE_TABLET | Freq: Two times a day (BID) | ORAL | Status: DC
Start: 2016-06-20 — End: 2016-06-21
  Administered 2016-06-20: 500 mg via ORAL

## 2016-06-20 NOTE — Progress Notes (Signed)
Linens changed, bed wiped down, bathroom wiped down. Urine noted to be all over pt's toilet. Pt continues to refuse hygiene although encouraged strongly. Will continue to monitor.

## 2016-06-20 NOTE — Plan of Care (Signed)
Problem: Coping: Goal: Ability to verbalize feelings will improve Outcome: Progressing Verbalized feelings to staff.    

## 2016-06-20 NOTE — [facility] Group Notes (Signed)
[facility] Group Notes:  (Nursing/MHT/Case Management/Adjunct)  Date:  06/20/2016  Time:  10:41 PM  Type of Therapy:  Group Therapy  Participation Level:  Active  Participation Quality:  Appropriate  Affect:  Appropriate  Cognitive:  Appropriate  Insight:  Appropriate  Engagement in Group:  Engaged  Modes of Intervention:  Discussion  Summary of Progress/Problems:  Jeffrey EkJanice Marie Venise [name] 06/20/2016, 10:41 PM

## 2016-06-20 NOTE — Progress Notes (Signed)
Pt in bed all day sleeping. Will not get up for meals or medications. Pt was compliant with medications when I took them to his bed. Poor hygiene, body odor present, refuses to shower, change his clothes. Will change linens once patient is awake and out of bed. Support and encouragement provided. Safety maintained with every 15 minute checks. Will continue to monitor.

## 2016-06-20 NOTE — [facility] Group Notes (Signed)
[facility] LCSW Group Therapy  06/20/2016 2:52 PM  Type of Therapy:  Group Therapy  Participation Level:  Patient did not attend group. CSW invited patient to group.   Summary of Progress/Problems: Balance in life: Patients will discuss the concept of balance and how it looks and feels to be unbalanced. Pt will identify areas in their life that is unbalanced and ways to become more balanced. They discussed what aspects in their lives has influenced their self care. Patients also discussed self care in the areas of self regulation/control, hygiene/appearance, sleep/relaxation, healthy leisure, healthy eating habits, exercise, inner peace/spirituality, self improvement, sobriety, and health management. They were challenged to identify changes that are needed in order to improve self care.  Tashiana Lamarca G. Garnette CzechSampson MSW, LCSWA 06/20/2016, 2:53 PM

## 2016-06-20 NOTE — [facility] Group Notes (Signed)
[facility] LCSW Group Therapy Note  Type of Therapy and Topic:  Group Therapy:  Goals Group: SMART Goals  Participation Level:  Patient did not attend group. CSW invited patient to group.   Description of Group:   The purpose of a daily goals group is to assist and guide patients in setting recovery/wellness-related goals.  The objective is to set goals as they relate to the crisis in which they were admitted. Patients will be using SMART goal modalities to set measurable goals.  Characteristics of realistic goals will be discussed and patients will be assisted in setting and processing how one will reach their goal. Facilitator will also assist patients in applying interventions and coping skills learned in psycho-education groups to the SMART goal and process how one will achieve defined goal.  Therapeutic Goals: -Patients will develop and document one goal related to or their crisis in which brought them into treatment. -Patients will be guided by LCSW using SMART goal setting modality in how to set a measurable, attainable, realistic and time sensitive goal.  -Patients will process barriers in reaching goal. -Patients will process interventions in how to overcome and successful in reaching goal.   Summary of Patient Progress:  Patient Goal: Patient did not attend group. CSW invited patient to group.    Therapeutic Modalities:   Motivational Interviewing Engineer, manufacturing systemsCognitive Behavioral Therapy Crisis Intervention Model SMART goals setting  Jeffrey Borunda G. Garnette CzechSampson MSW, [facility] Regional Health CenterCSWA 06/20/2016 10:33 AM

## 2016-06-20 NOTE — Progress Notes (Signed)
D: Pt denies SI/HI/AVH. Pt is pleasant and cooperative, affect is flat but brightens upon approach. Patient is less disorganized, appears less anxious and he is interacting with peers and staff appropriately.  A: Pt was offered support and encouragement. Pt was given scheduled medications. Pt was encouraged to attend groups. Q 15 minute checks were done for safety.  R:Pt attends groups and interacts well with peers and staff. Pt is taking medication. Pt has no complaints.Pt receptive to treatment and safety maintained on unit.   

## 2016-06-20 NOTE — [facility] Group Notes (Signed)
[facility] Group Notes:  (Nursing/MHT/Case Management/Adjunct)  Date:  06/20/2016  Time:  4:02 AM  Type of Therapy:  Psychoeducational Skills  Participation Level:  None  Participation Quality:  Inattentive  Affect:  Appropriate  Cognitive:  Disorganized, Delusional and Lacking  Insight:  None  Engagement in Group:  None  Modes of Intervention:  Clarification and Discussion  Summary of Progress/Problems:  Foy GuadalajaraJasmine R Caliber [place] 06/20/2016, 4:02 AM
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
## 2016-06-20 NOTE — Plan of Care (Signed)
Problem: Activity: Goal: Will verbalize the importance of balancing activity with adequate rest periods Outcome: Not Progressing Pt in bed asleep all day. Refuses to get up. Responds to voice.

## 2016-06-21 MED ORDER — DIVALPROEX SODIUM 250 MG PO DR TAB
750.0000 mg | DELAYED_RELEASE_TABLET | Freq: Two times a day (BID) | ORAL | Status: DC
  Administered 2016-06-21 – 2016-06-23 (×6): 750 mg via ORAL
  Filled 2016-06-21 (×6): qty 1

## 2016-06-21 NOTE — Plan of Care (Signed)
Problem: Coping: Goal: Ability to verbalize frustrations and anger appropriately will improve Outcome: Not Progressing Isolates to room.  No communication with staff or peers.

## 2016-06-21 NOTE — Progress Notes (Signed)
Isolates to his room.  Did not eat breakfast or lunch.  Up for dinner. No interaction with peers or staff.  Support offered.  Safety maintained.

## 2016-06-21 NOTE — Social Work (Signed)
CSW contacted pt's legal guardian, Corwin LevinsRon Hardie @ [phone] from [address] [place]. Pt's legal guardian unable to talk at the moment. CSW left voicemail and is awaiting return call at this time.   Hampton AbbotKadijah Erminie Foulks, MSW, LCSW-A 06/21/2016, 1:17PM

## 2016-06-21 NOTE — [facility] Group Notes (Signed)
[facility] LCSW Group Therapy  06/21/2016 2:36 PM  Type of Therapy:  Group Therapy  Participation Level:  Patient did not attend group. CSW invited patient to group.   Summary of Progress/Problems: Safety Planning: Patients identified fears or worries surrounding discharge. Patients offered support to their peers and openly developed safety plans for their individual needs. Patients developed their own safety plan. Patients discussed their warning signs, coping strategies, support system with family and friends, identified mental health professionals, and how to keep their environments safe (ex. Removing unnecessary medications or removing weapons/guns). Patients then discussed their personalized safety plan with the group.   Jesua Tamblyn G. Garnette CzechSampson MSW, LCSWA 06/21/2016, 2:36 PM

## 2016-06-21 NOTE — Tx Team (Signed)
Interdisciplinary Treatment and Diagnostic Plan Update  06/21/2016 Time of Session: 10:30am CURTISS MAHMOOD MRN: [id number]  Principal Diagnosis: Schizophrenia, undifferentiated (HCC)  Secondary Diagnoses: Principal Problem:   Schizophrenia, undifferentiated (HCC) Active Problems:   Tobacco use disorder   Noncompliance   Dyslipidemia   Constipation   Current Medications:  Current Facility-Administered Medications  Medication Dose Route Frequency Provider Last Rate Last Dose  . acetaminophen (TYLENOL) tablet 650 mg  650 mg Oral Q6H PRN Clapacs, John T, MD      . alum & mag hydroxide-simeth (MAALOX/MYLANTA) 200-200-20 MG/5ML suspension 30 mL  30 mL Oral Q4H PRN Clapacs, John T, MD      . chlorproMAZINE (THORAZINE) tablet 50 mg  50 mg Oral TID PRN Pucilowska, [name], MD   50 mg at 06/18/16 2142  . cloNIDine (CATAPRES) tablet 0.1 mg  0.1 mg Oral BID PRN Darliss Ridgel, MD      . cloZAPine (CLOZARIL) tablet 100 mg  100 mg Oral Daily Clapacs, [name], MD   100 mg at 06/21/16 0904  . cloZAPine (CLOZARIL) tablet 400 mg  400 mg Oral QHS Clapacs, John T, MD   400 mg at 06/20/16 2256  . divalproex (DEPAKOTE) DR tablet 750 mg  750 mg Oral Q12H Pucilowska, [name], MD   750 mg at 06/21/16 0905  . LORazepam (ATIVAN) tablet 2 mg  2 mg Oral Q4H PRN Pucilowska, [name], MD   2 mg at 06/19/16 2150  . magnesium hydroxide (MILK OF MAGNESIA) suspension 30 mL  30 mL Oral Daily PRN Clapacs, John T, MD      . metFORMIN (GLUCOPHAGE) tablet 500 mg  500 mg Oral BID WC Pucilowska, [name], MD   500 mg at 06/21/16 0905  . senna (SENOKOT) tablet 17.2 mg  2 tablet Oral QHS Clapacs, John T, MD   17.2 mg at 06/20/16 2256  . simvastatin (ZOCOR) tablet 40 mg  40 mg Oral QHS Clapacs, [name], MD   40 mg at 06/20/16 2256  . traZODone (DESYREL) tablet 200 mg  200 mg Oral QHS Clapacs, John T, MD   200 mg at 06/20/16 2256   PTA Medications: Prescriptions Prior to Admission  Medication Sig Dispense Refill Last Dose   . cloZAPine (CLOZARIL) 100 MG tablet Take 1-4 tablets (100-400 mg total) by mouth 2 (two) times daily. Patient takes 1 tablet (100 mg) in the morning and 4 tablets (400 mg) at bedtime. (Patient not taking: Reported on 04/26/2016) 150 tablet 0 Not Taking at Unknown time  . paliperidone (INVEGA) 6 MG 24 hr tablet Take 6 mg by mouth 2 (two) times daily.   Not Taking at Unknown time  . senna (SENOKOT) 8.6 MG TABS tablet Take 2 tablets (17.2 mg total) by mouth at bedtime. (Patient not taking: Reported on 04/26/2016) 120 each 0 Not Taking at Unknown time  . simvastatin (ZOCOR) 40 MG tablet Take 40 mg by mouth at bedtime.   Not Taking at Unknown time  . traZODone (DESYREL) 100 MG tablet Take 200 mg by mouth at bedtime.   Not Taking at Unknown time    Patient Stressors: Marital or family conflict Medication change or noncompliance Traumatic event  Patient Strengths: Astronomer fund of knowledge Physical Health  Treatment Modalities: Medication Management, Group therapy, Case management,  1 to 1 session with clinician, Psychoeducation, Recreational therapy.   Physician Treatment Plan for Primary Diagnosis: Schizophrenia, undifferentiated (HCC) Long Term Goal(s): Improvement in symptoms so as ready for  discharge NA   Short Term Goals: Ability to identify changes in lifestyle to reduce recurrence of condition will improve Ability to verbalize feelings will improve Ability to disclose and discuss suicidal ideas Ability to demonstrate self-control will improve Ability to identify and develop effective coping behaviors will improve Compliance with prescribed medications will improve Ability to identify triggers associated with substance abuse/mental health issues will improve NA  Medication Management: Evaluate patient's response, side effects, and tolerance of medication regimen.  Therapeutic Interventions: 1 to 1 sessions, Unit Group sessions and Medication  administration.  Evaluation of Outcomes: Progressing  Physician Treatment Plan for Secondary Diagnosis: Principal Problem:   Schizophrenia, undifferentiated (HCC) Active Problems:   Tobacco use disorder   Noncompliance   Dyslipidemia   Constipation  Long Term Goal(s): Improvement in symptoms so as ready for discharge NA   Short Term Goals: Ability to identify changes in lifestyle to reduce recurrence of condition will improve Ability to verbalize feelings will improve Ability to disclose and discuss suicidal ideas Ability to demonstrate self-control will improve Ability to identify and develop effective coping behaviors will improve Compliance with prescribed medications will improve Ability to identify triggers associated with substance abuse/mental health issues will improve NA     Medication Management: Evaluate patient's response, side effects, and tolerance of medication regimen.  Therapeutic Interventions: 1 to 1 sessions, Unit Group sessions and Medication administration.  Evaluation of Outcomes: Progressing   RN Treatment Plan for Primary Diagnosis: Schizophrenia, undifferentiated (HCC) Long Term Goal(s): Knowledge of disease and therapeutic regimen to maintain health will improve  Short Term Goals: Ability to remain free from injury will improve, Ability to verbalize frustration and anger appropriately will improve, Ability to demonstrate self-control, Ability to participate in decision making will improve and Ability to verbalize feelings will improve  Medication Management: RN will administer medications as ordered by provider, will assess and evaluate patient's response and provide education to patient for prescribed medication. RN will report any adverse and/or side effects to prescribing provider.  Therapeutic Interventions: 1 on 1 counseling sessions, Psychoeducation, Medication administration, Evaluate responses to treatment, Monitor vital signs and CBGs as  ordered, Perform/monitor CIWA, COWS, AIMS and Fall Risk screenings as ordered, Perform wound care treatments as ordered.  Evaluation of Outcomes: Progressing   LCSW Treatment Plan for Primary Diagnosis: Schizophrenia, undifferentiated (HCC) Long Term Goal(s): Safe transition to appropriate next level of care at discharge, Engage patient in therapeutic group addressing interpersonal concerns.  Short Term Goals: Engage patient in aftercare planning with referrals and resources, Increase social support, Increase ability to appropriately verbalize feelings and Increase emotional regulation  Therapeutic Interventions: Assess for all discharge needs, 1 to 1 time with Social worker, Explore available resources and support systems, Assess for adequacy in community support network, Educate family and significant other(s) on suicide prevention, Complete Psychosocial Assessment, Interpersonal group therapy.  Evaluation of Outcomes: Progressing   Progress in Treatment: Attending groups: No. Participating in groups: No. Taking medication as prescribed: Yes. Toleration medication: Yes. Family/Significant other contact made: Yes, individual(s) contacted:  guardian Patient understands diagnosis: Yes. Discussing patient identified problems/goals with staff: Yes. Medical problems stabilized or resolved: Yes. Denies suicidal/homicidal ideation: Yes. Issues/concerns per patient self-inventory: No. Other: n/a  New problem(s) identified: None identified at this time.   New Short Term/Long Term Goal(s): Patient unable to set goal at this time.   Discharge Plan or Barriers: Pt referred to [facility]. Patient is on waitlist.   Patient legal guardian stated that pt's disability money is on "  hold" and will not be able to help with placement at this time.  Reason for Continuation of Hospitalization: Aggression Depression Hallucinations  Estimated Length of Stay: 7 days.   Attendees: Patient: Jeanene ErbMichael Blackwelder  06/21/2016 1:17 PM  Physician: Dr. Kristine LineaJolanta Pucilowska, MD 06/21/2016 1:17 PM  Nursing: Leonia ReaderPhyllis Cobb, BSN, RN 06/21/2016 1:17 PM  RN Care Manager: 06/21/2016 1:17 PM  Social Worker: Hampton AbbotKadijah Karah Caruthers, MSW, LCSW-A 06/21/2016 1:17 PM  Recreational Therapist: Jacquelynn CreeElizabeth M. Greene, LRT/CTRS 06/21/2016 1:17 PM  Other:  06/21/2016 1:17 PM  Other:  06/21/2016 1:17 PM  Other: 06/21/2016 1:17 PM    Scribe for Treatment Team: Lynden OxfordKadijah R Moise Friday, LCSWA 06/21/2016 1:17 PM

## 2016-06-21 NOTE — Progress Notes (Signed)
Recreation Therapy Notes  Date: 06.01.18 Time: 9:30 am Location: Craft Room  Group Topic: Coping Skills  Goal Area(s) Addresses:  Patient will participate in healthy coping skill. Patient will verbalize benefit of using art as a coping skill.  Behavioral Response: Did not attend  Intervention: Coloring  Activity: Patients were given coloring sheets to color and were instructed to think about what emotions they were feeling as well as what their minds were focused on.  Education: LRT educated patients on healthy coping skills.  Education Outcome: Patient did not attend group.   Clinical Observations/Feedback: Patient did not attend group.  Rony Ratz M, LRT/CTRS 06/21/2016 10:32 AM 
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
## 2016-06-21 NOTE — [facility] Group Notes (Signed)
[facility] Group Notes:  (Nursing/MHT/Case Management/Adjunct)  Date:  06/21/2016  Time:  11:42 PM  Type of Therapy:  Group Therapy  Participation Level:  Active  Participation Quality:  Appropriate  Affect:  Appropriate  Cognitive:  Appropriate  Insight:  Appropriate  Engagement in Group:  Engaged  Modes of Intervention:  Discussion  Summary of Progress/Problems:  Burt EkJanice Marie Adiyah Lame 06/21/2016, 11:42 PM
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
## 2016-06-22 NOTE — Progress Notes (Signed)
Continues to isolate to room.  No interaction with peers of staff.  Support offered.  Safety maintained.

## 2016-06-22 NOTE — Progress Notes (Signed)
Patient pleasant throughout the shift.  He initiated conversation with Clinical research associatewriter about a movie he was watching called "Twister."  He went into detail about what he learned from the movie.

## 2016-06-22 NOTE — [facility] Group Notes (Signed)
[facility] LCSW Group Therapy  06/22/2016 3:14 PM  Type of Therapy:  Group Therapy  Participation Level:  None  Participation Quality:  Attentive  Affect:  Appropriate  Cognitive:  Lacking  Insight:  None  Engagement in Therapy:  None  Modes of Intervention:  Activity, Discussion, Education, Problem-solving, Dance movement psychotherapisteality Testing, Socialization and Support  Summary of Progress/Problems: Boundaries: Patients defined boundaries and discussed the importance having clear boundaries within their relationships. Patients identified their own boundaries. Patients established limits and rules within relationships both personally and professionally. Patients discussed ways to create and/ or improve their personal boundaries and utilizing assertive communications skills. Patient arrived to group late. Patient did not engage in group discussion but behavior in group was appropriate.   Ashawnti Tangen G. Garnette CzechSampson MSW, LCSWA 06/22/2016, 3:16 PM

## 2016-06-22 NOTE — [facility] Group Notes (Signed)
[facility] Group Notes:  (Nursing/MHT/Case Management/Adjunct)  Date:  06/22/2016  Time:  11:31 PM  Type of Therapy:  Psychoeducational Skills  Participation Level:  Active  Participation Quality:  Appropriate, Attentive and Sharing  Affect:  Appropriate  Cognitive:  Appropriate  Insight:  Appropriate  Engagement in Group:  Engaged  Modes of Intervention:  Discussion, Socialization and Support  Summary of Progress/Problems:  Jeffrey MilroyLaquanda Y Quinesha [name] 06/22/2016, 11:31 PM

## 2016-06-23 NOTE — Progress Notes (Signed)
Patient irritable, did not want to get out of bed for meals and medications but eventually got out of bed with a blanket over his shoulder. Patient was talked to and informed that he could not bring a blanket to the dayroom. Patient irritable, grumpy and required encouragement to do anything. Patient was not receptive of any teaching. No falls reported thus far. Will maintain verbal contacts to maintain safety.

## 2016-06-23 NOTE — Plan of Care (Signed)
Problem: Coping: Goal: Ability to cope will improve Outcome: Progressing Patient was observed walking in the halls wearing street clothes that did not smelled soiled and hair was combed.  He acknowledged Clinical research associatewriter when introduced as his Engineer, civil (consulting)nurse.  He was observed spending time in the dayroom watching television and joined the group for snack.  He displayed no negative behaviors this evening.

## 2016-06-23 NOTE — Plan of Care (Signed)
Problem: Safety: Goal: Ability to redirect hostility and anger into socially appropriate behaviors will improve Outcome: Progressing Patient spent time watching television with peers and also pacing in the hallway.  He acknowledged Clinical research associatewriter when addressed and did not display hostility or respond to unseen others this evening.  He initiated asking for a laxative and showed some difficulty in chosing words to describe what he needed.  He received MOM tonight.
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
## 2016-06-23 NOTE — Plan of Care (Signed)
Problem: Safety: Goal: Ability to remain free from injury will improve Outcome: Progressing No falls noted thus far.

## 2016-06-24 LAB — CBC WITH DIFFERENTIAL/PLATELET
Basophils Absolute: 0 10*3/uL (ref 0–0.1)
Basophils Relative: 1 %
Eosinophils Absolute: 0 10*3/uL (ref 0–0.7)
Eosinophils Relative: 0 %
HEMATOCRIT: 41.7 % (ref 40.0–52.0)
HEMOGLOBIN: 14.5 g/dL (ref 13.0–18.0)
LYMPHS ABS: 1.7 10*3/uL (ref 1.0–3.6)
LYMPHS PCT: 28 %
MCH: 30 pg (ref 26.0–34.0)
MCHC: 34.7 g/dL (ref 32.0–36.0)
MCV: 86.5 fL (ref 80.0–100.0)
Monocytes Absolute: 0.7 10*3/uL (ref 0.2–1.0)
Monocytes Relative: 12 %
NEUTROS ABS: 3.5 10*3/uL (ref 1.4–6.5)
NEUTROS PCT: 59 %
Platelets: 174 10*3/uL (ref 150–440)
RBC: 4.83 MIL/uL (ref 4.40–5.90)
RDW: 13.8 % (ref 11.5–14.5)
WBC: 5.9 10*3/uL (ref 3.8–10.6)

## 2016-06-24 MED ORDER — DIVALPROEX SODIUM 500 MG PO DR TAB
1000.0000 mg | DELAYED_RELEASE_TABLET | Freq: Two times a day (BID) | ORAL | Status: DC
  Administered 2016-06-24 – 2016-07-10 (×33): 1000 mg via ORAL
  Filled 2016-06-24 (×33): qty 2

## 2016-06-24 MED ORDER — TEMAZEPAM 15 MG PO CAPS
30.0000 mg | ORAL_CAPSULE | Freq: Every day | ORAL | Status: DC
  Administered 2016-06-24 – 2016-07-07 (×14): 30 mg via ORAL
  Filled 2016-06-24 (×14): qty 2

## 2016-06-24 NOTE — Progress Notes (Signed)
Patient  Was dressed in street clothes and awake until 0200, pacing in the hall. He complained of inability to sleep and hunger. He consumed milk, juice, and a sandwich tray after 0100.  He received Ativan 2 mg for anxiety. He as cooperative with care and suggestions.

## 2016-06-24 NOTE — Plan of Care (Signed)
Problem: Self-Care: Goal: Ability to participate in self-care as condition permits will improve Outcome: Not Progressing Patient disheveled with notable body odor.  Encouraged to take shower.

## 2016-06-24 NOTE — [facility] Group Notes (Signed)
[facility] LCSW Group Therapy   06/24/2016 1:00 pm Type of Therapy: Group Therapy   Participation Level: Patient invited but did not attend.   Hampton AbbotKadijah Tavish Gettis, MSW, LCSW-A 06/24/2016, 2:47PM

## 2016-06-24 NOTE — Progress Notes (Signed)
[facility] MD Progress Note  06/24/2016 9:27 AM Jeffrey [name]  MRN:  [id number]  Subjective:  Jeffrey [name] is a 56 year old male with schizophrenia admitted floridly psychotic in the context of treatment noncompliance following his mother's death. He is on Clozapine and Depakote improving very slowly with terrible hygiene. He has a new guardian. Awaiting transfer to [facility] or placement if improved.   06/24/2016. Jeffrey [name] is up this morning and quite agitated. He received a dose of Ativan last night. Spoke with me 5 times already demanding discharge, call sheriff Laural Benes and change his guardian to a "white women from psychiatric center on [address]". His hygiene is terrible. Last week he slept most of the time. Last night he slept 4 hours only and has been up all morning. He accepts medications and repports no side effects. Appetite is fair. No program participation.  Per nursing: Patient  Was dressed in street clothes and awake until 0200, pacing in the hall. He complained of inability to sleep and hunger. He consumed milk, juice, and a sandwich tray after 0100.  He received Ativan 2 mg for anxiety. He as cooperative with care and suggestions.  Principal Problem: Schizophrenia, undifferentiated (HCC) Diagnosis:   Patient Active Problem List   Diagnosis Date Noted  . Dyslipidemia [E78.5] 05/27/2016  . Constipation [K59.00] 05/27/2016  . Schizophrenia, undifferentiated (HCC) [F20.3] 05/27/2016  . Noncompliance [Z91.19] 02/15/2016  . 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 Schizophrenia (HCC) [F20.9] 11/08/2014  . Tobacco use disorder [F17.200] 06/30/2014  . 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 Hypertension [I10] 06/29/2014  . Hyponatremia [E87.1] 06/29/2014  . Laceration of neck [S11.91XA] 06/29/2014  . Suicidal behavior [R46.89] 06/29/2014   Total Time spent with patient: 20 minutes  Past Psychiatric History: schizophrenia.  Past Medical History:  Past Medical History:   Diagnosis Date  . Hypertension   . Schizophrenia [facility])     Past Surgical History:  Procedure Laterality Date  . 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 No pertinent family history. Family Psychiatric  History: none reported. Social History:  History  Alcohol Use No     History  Drug Use No    Social History   Social History  . Marital status: Single    Spouse name: N/A  . Number of children: N/A  . Years of education: N/A   Social History Main Topics  . Smoking status: Current Every Day Smoker    Packs/day: 1.00    Types: Cigarettes  . Smokeless tobacco: Current User  . Alcohol use No  . Drug use: No  . Sexual activity: Not Asked   Other Topics Concern  . None   Social History Narrative  . None   Additional Social History:    History of alcohol / drug use?: No history of alcohol / drug abuse      Current Medications: Current Facility-Administered Medications  Medication Dose Route Frequency Provider Last Rate Last Dose  . acetaminophen (TYLENOL) tablet 650 mg  650 mg Oral Q6H PRN Clapacs, John T, MD      . alum & mag hydroxide-simeth (MAALOX/MYLANTA) 200-200-20 MG/5ML suspension 30 mL  30 mL Oral Q4H PRN Clapacs, John T, MD      . chlorproMAZINE (THORAZINE) tablet 50 mg  50 mg Oral TID PRN Aleena Kirkeby B, MD   50 mg at 06/18/16 2142  . cloNIDine (CATAPRES) tablet 0.1 mg  0.1 mg Oral  BID PRN Darliss Ridgel, MD      . cloZAPine (CLOZARIL) tablet 100 mg  100 mg Oral Daily Clapacs, [name], MD   100 mg at 06/24/16 0854  . cloZAPine (CLOZARIL) tablet 400 mg  400 mg Oral QHS Clapacs, [name], MD   400 mg at 06/23/16 2116  . divalproex (DEPAKOTE) DR tablet 1,000 mg  1,000 mg Oral Q12H Larz Mark B, MD   1,000 mg at 06/24/16 0854  . LORazepam (ATIVAN) tablet 2 mg  2 mg Oral Q4H PRN Tirsa Gail B, MD   2 mg at 06/24/16 0043  . magnesium hydroxide (MILK OF MAGNESIA) suspension 30 mL  30  mL Oral Daily PRN Clapacs, [name], MD   30 mL at 06/23/16 2139  . metFORMIN (GLUCOPHAGE) tablet 500 mg  500 mg Oral BID WC Ricco Dershem B, MD   500 mg at 06/24/16 0854  . senna (SENOKOT) tablet 17.2 mg  2 tablet Oral QHS Clapacs, John T, MD   17.2 mg at 06/23/16 2118  . simvastatin (ZOCOR) tablet 40 mg  40 mg Oral QHS Clapacs, [name], MD   40 mg at 06/23/16 2118  . traZODone (DESYREL) tablet 200 mg  200 mg Oral QHS Clapacs, John T, MD   200 mg at 06/23/16 2117    Lab Results:  No results found for this or any previous visit (from the past 48 hour(s)).  Blood Alcohol level:  Lab Results  Component Value Date   ETH <5 05/26/2016   ETH <5 04/26/2016    Metabolic Disorder Labs: Lab Results  Component Value Date   HGBA1C 5.6 05/28/2016   MPG 114 05/28/2016   No results found for: PROLACTIN Lab Results  Component Value Date   CHOL 139 05/28/2016   TRIG 84 05/28/2016   HDL 43 05/28/2016   CHOLHDL 3.2 05/28/2016   VLDL 17 05/28/2016   LDLCALC 79 05/28/2016   LDLCALC 74 11/08/2014    Physical Findings: AIMS: Facial and Oral Movements Muscles of Facial Expression: None, normal Lips and Perioral Area: None, normal Jaw: None, normal Tongue: None, normal,Extremity Movements Upper (arms, wrists, hands, fingers): Minimal Lower (legs, knees, ankles, toes): Minimal, Trunk Movements Neck, shoulders, hips: None, normal, Overall Severity Severity of abnormal movements (highest score from questions above): Minimal Incapacitation due to abnormal movements: None, normal Patient's awareness of abnormal movements (rate only patient's report): No Awareness, Dental Status Current problems with teeth and/or dentures?: Yes (missing teeth ) Does patient usually wear dentures?: No  CIWA:    COWS:     Musculoskeletal: Strength & Muscle Tone: within normal limits Gait & Station: normal Patient leans: N/A  Psychiatric Specialty Exam: Physical Exam  Nursing note and vitals  reviewed. Musculoskeletal: He exhibits deformity.  Psychiatric: His affect is labile. His speech is slurred. He is hyperactive and actively hallucinating. Thought content is paranoid and delusional. Cognition and memory are impaired. He expresses impulsivity.    Review of Systems  Unable to perform ROS: Acuity of condition  Constitutional:       Left upper extremity has been amputated. VEry poor hygeine.  Psychiatric/Behavioral: Positive for hallucinations.  All other systems reviewed and are negative.   Blood pressure 111/78, pulse 98, temperature 98.3 F (36.8 C), temperature source Oral, resp. rate 18, height 5\' 8"  (1.727 m), weight 80.3 kg (177 lb), SpO2 99 %.Body mass index is 26.91 kg/m.  General Appearance: Fairly Groomed  Eye Contact:  Good  Speech:  Garbled and Pressured  Volume:  Normal  Mood:  Irritable  Affect:  Inappropriate and Labile  Thought Process:  NA  Orientation:  Full (Time, Place, and Person)  Thought Content:  NA, Delusions, Hallucinations: Auditory and Paranoid Ideation  Suicidal Thoughts:  No  Homicidal Thoughts:  No  Memory:  Immediate;   Poor Recent;   Poor Remote;   Poor  Judgement:  Impaired  Insight:  Lacking  Psychomotor Activity:  Increased  Concentration:  Concentration: Poor and Attention Span: Poor  Recall:  Poor  Fund of Knowledge:  Poor  Language:  Poor  Akathisia:  No  Handed:  Right  AIMS (if indicated):     Assets:  Communication Skills Desire for Improvement Financial Resources/Insurance Physical Health Resilience  ADL's:  Intact  Cognition:  WNL  Sleep:  Number of Hours: 4     Treatment Plan Summary: Daily contact with patient to assess and evaluate symptoms and progress in treatment and Medication management   [name] is a [age] year old male with a history of schizophrenia admitted for psychotic break in the context of medication noncompliance and severe social stressors.  1. Psychosis. He was  restarted Clozapine 100  mg in the morning 400 mg at night for psychosis and Depakote for mood stabilization.   2. Dyslipidemia. He is on Lipitor.  3. Insomnia. Will switch from Trazodone to Restoril.   4. Smoking. Nicotine patch is available.  5. Metabolic syndrome monitoring. Lipid panel and TSH are normal. HgbA1C 5.6. We restarted Metformin.  6. EKG. Normal sinus rhythm, QTc 427.  7. Social. The patient has a new legal guardian.   8. Agitation. Ativan and Thorazine are available and he has been receiving prn medications.  9. Disposition. [facility]. He will follow up with ACT team.     Kristine LineaJolanta Dorthia Tout, MD 06/24/2016, 9:27 AM

## 2016-06-24 NOTE — [facility] Group Notes (Signed)
[facility] Group Notes:  (Nursing/MHT/Case Management/Adjunct)  Date:  06/24/2016  Time:  4:55 AM  Type of Therapy:  Group Therapy  Participation Level:  Did Not Attend    Summary of Progress/Problems:  Jeffrey [name] 06/24/2016, 4:55 AM

## 2016-06-24 NOTE — [facility] Group Notes (Signed)
[facility] Group Notes:  (Nursing/MHT/Case Management/Adjunct)  Date:  06/24/2016  Time:  11:27 PM  Type of Therapy:  Psychoeducational Skills  Participation Level:  Did Not Attend  Camay Pedigo R Fotini Lemus 06/24/2016, 11:27 PM 

## 2016-06-24 NOTE — Progress Notes (Signed)
Patient stays to himself.  Refused to have lab drawn first am but agreed to having labs drawn later in the day.  Stays to self.  Up to dayroom periodically watching TV.  No interaction noted with peers. Medication compliant.  Support offered. Safety maintained.

## 2016-06-24 NOTE — Progress Notes (Signed)
Recreation Therapy Notes  Date: 06.04.18 Time: 9:30 am Location: Craft Room  Group Topic: Self-expression  Goal Area(s) Addresses:  Patient will be able to identify a color that represents each emotion. Patient will verbalize benefit of using art as a means of self-expression. Patient will verbalize one emotion experienced while participating in activity.  Behavioral Response: Did not attend  Intervention: The Colors Within Me  Activity: Patients were given a blank face worksheet and were instructed to pick a color for each emotion they were feeling and show on the worksheet how much of that emotion they were feeling.  Education: LRT educated patients on other forms of self-expression.  Education Outcome: Patient did not attend group.  Clinical Observations/Feedback: Patient did not attend group.  Jacquelynn CreeGreene,Mattias Walmsley M, LRT/CTRS 06/24/2016 10:17 AM

## 2016-06-24 NOTE — Progress Notes (Signed)
[facility] [facility], [phone].  Pt still not near the top of the wait list.  SHe had questions regarding pt housing/finances and where [facility] be [facility].   Garner NashGregory Vicktoria Muckey, MSW, LCSW Clinical Social Worker 06/24/2016 3:30 PM

## 2016-06-24 NOTE — BH Assessment (Signed)
[facility] called to f/u w/ pt referral. [facility] informed pt has been placed on inpatient unit.

## 2016-06-25 MED ORDER — TUBERCULIN PPD 5 UNIT/0.1ML ID SOLN
5.0000 [IU] | Freq: Once | INTRADERMAL | Status: AC
  Administered 2016-06-30: 5 [IU] via INTRADERMAL
  Filled 2016-06-25: qty 0.1

## 2016-06-25 NOTE — [facility] Group Notes (Signed)
[facility] LCSW Group Therapy  06/25/2016 3:51 PM  Type of Therapy:  Group Therapy  " Feelings around Diagnosis"   Participation Level:  Did Not Attend    Jeffrey [name] N 06/25/2016, 3:51 PM  

## 2016-06-25 NOTE — Progress Notes (Signed)
Recreation Therapy Notes  Date: 06.05.18 Time: 9:30 am Location: Craft Room  Group Topic: Coping Skills  Goal Area(s) Addresses:  Patient will write healthy coping skills. Patient will verbalize benefit of using healthy coping skills.  Behavioral Response: Did not attend  Intervention: Coping Skills Alphabet  Activity: Patients were given a Coping Skills Alphabet worksheet and were instructed to write healthy coping skills for each letter of the alphabet.  Education: LRT educated patients on healthy coping skills.  Education Outcome: Patient did not attend group.  Clinical Observations/Feedback: Patient did not attend group.  Jacquelynn CreeGreene,Mckennah Kretchmer M, LRT/CTRS 06/25/2016 10:17 AM

## 2016-06-25 NOTE — NC FL2 (Signed)
[place] MEDICAID FL2 LEVEL OF CARE SCREENING TOOL     IDENTIFICATION  Patient Name: Jeffrey [name] Birthdate: 1960/07/04 Sex: male Admission Date (Current Location): 05/27/2016  [place]  [place] [id number] [facility] Facility [facility] Address:  [facility], [address], [place], [place] [address]      Provider Number: [id number]  Attending Physician Name and Address:  Shari ProwsPucilowska, [name], MD  Relative Name and Phone Number:  Zack SealRonnie Hardie, legal guardian.  [phone]    Current Level of Care: Hospital Recommended Level of Care: Family Care Home Prior Approval Number:    Date Approved/Denied:   PASRR Number:    Discharge Plan: Other (Comment) (family care home)    Current Diagnoses: Patient Active Problem List   Diagnosis Date Noted  . Dyslipidemia 05/27/2016  . Constipation 05/27/2016  . Schizophrenia, undifferentiated (HCC) 05/27/2016  . Noncompliance 02/15/2016  . Disorganized schizophrenia (HCC)   . Schizophrenia (HCC) 11/08/2014  . Tobacco use disorder 06/30/2014  . 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 Hypertension 06/29/2014  . Hyponatremia 06/29/2014  . Laceration of neck 06/29/2014  . Suicidal behavior 06/29/2014    Orientation RESPIRATION BLADDER Height & Weight     Self, Place  Normal Incontinent Weight: 177 lb (80.3 kg) Height:  5\' 8"  (172.7 cm)  BEHAVIORAL SYMPTOMS/MOOD NEUROLOGICAL BOWEL NUTRITION STATUS  Other (Comment) (has had altercations with others)  (na) Continent  (na)  AMBULATORY STATUS COMMUNICATION OF NEEDS Skin   Independent Verbally Normal                       Personal Care Assistance Level of Assistance   (na)           Functional Limitations Info   (na)          SPECIAL CARE FACTORS FREQUENCY   (na)                    Contractures Contractures Info: Not present    Additional Factors Info   (na)               Current Medications (06/25/2016):  This  is the current hospital active medication list Current Facility-Administered Medications  Medication Dose Route Frequency Provider Last Rate Last Dose  . acetaminophen (TYLENOL) tablet 650 mg  650 mg Oral Q6H PRN Clapacs, John T, MD      . alum & mag hydroxide-simeth (MAALOX/MYLANTA) 200-200-20 MG/5ML suspension 30 mL  30 mL Oral Q4H PRN Clapacs, John T, MD      . chlorproMAZINE (THORAZINE) tablet 50 mg  50 mg Oral TID PRN Pucilowska, [name], MD   50 mg at 06/18/16 2142  . cloNIDine (CATAPRES) tablet 0.1 mg  0.1 mg Oral BID PRN Darliss RidgelKapur, Aarti K, MD      . cloZAPine (CLOZARIL) tablet 100 mg  100 mg Oral Daily Clapacs, Jackquline DenmarkJohn T, MD   100 mg at 06/25/16 0849  . cloZAPine (CLOZARIL) tablet 400 mg  400 mg Oral QHS Clapacs, Jackquline DenmarkJohn T, MD   400 mg at 06/24/16 2223  . divalproex (DEPAKOTE) DR tablet 1,000 mg  1,000 mg Oral Q12H Pucilowska, [name], MD   1,000 mg at 06/25/16 0849  . LORazepam (ATIVAN) tablet 2 mg  2 mg Oral Q4H PRN Pucilowska, [name], MD   2 mg at 06/24/16 0043  . magnesium hydroxide (MILK OF MAGNESIA) suspension 30 mL  30 mL Oral Daily PRN Clapacs, Jackquline DenmarkJohn T, MD  30 mL at 06/24/16 2259  . metFORMIN (GLUCOPHAGE) tablet 500 mg  500 mg Oral BID WC Pucilowska, [name], MD   500 mg at 06/25/16 0849  . senna (SENOKOT) tablet 17.2 mg  2 tablet Oral QHS Clapacs, John T, MD   17.2 mg at 06/24/16 2222  . simvastatin (ZOCOR) tablet 40 mg  40 mg Oral QHS Clapacs, [name], MD   40 mg at 06/24/16 2223  . temazepam (RESTORIL) capsule 30 mg  30 mg Oral QHS Pucilowska, [name], MD   30 mg at 06/24/16 2223     Discharge Medications: Please see discharge summary for a list of discharge medications.  Relevant Imaging Results:  Relevant Lab Results:   Additional Information None  Lorri Frederick, LCSW

## 2016-06-25 NOTE — Progress Notes (Signed)
Continues to isolate.  Minimal interaction.  Medication compliant.  Support and encouragement offered.  Safety maintained.

## 2016-06-25 NOTE — Progress Notes (Addendum)
Jeffrey [name]  06/25/2016 4:47 PM Jeffrey [name]  MRN:  [id number]  Subjective:  Jeffrey [name] is a 56 year old male with schizophrenia admitted floridly psychotic in the context of treatment noncompliance following his mother's death. He is on Clozapine and Depakote improving very slowly with terrible hygiene. He has a new guardian. Awaiting transfer to [facility] or placement if improved.   06/24/2016. Jeffrey [name] is up this morning and quite agitated. He received a dose of Ativan last night. Spoke with me 5 times already demanding discharge, call sheriff Laural Benes and change his guardian to a "white women from psychiatric center on [address]". His hygiene is terrible. Last week he slept most of the time. Last night he slept 4 hours only and has been up all morning. He accepts medications and repports no side effects. Appetite is fair. No program participation.  06/25/2016.Jeffrey [name] is up and running today. He is asking for discharge with family to "his house". He seems not to understand that he will be placed in a group home. He has lived in a group home before. He refuses to see his guardian and demands a meeting with a "[facility] women from mental [facility]". Apparently this is a member of his ACT team. Unfortunatly the patient has racist tendencies. He takes medications. Sleep and appetite are fair. [facility] is unlikely to take him as they have many conditions for admission, like stable living situation. We will request case manager from his Medicaid provider and a meeting with the ACT team and the guardian.  Per nursing: Patient stays to himself.  Refused to have lab drawn first am but agreed to having labs drawn later in the day.  Stays to self.  Up to dayroom periodically watching TV.  No interaction noted with peers. Medication compliant.  Support offered. Safety maintained.  Principal Problem: Schizophrenia, undifferentiated (HCC) Diagnosis:   Patient Active Problem List   Diagnosis Date Noted   . Dyslipidemia [E78.5] 05/27/2016  . Constipation [K59.00] 05/27/2016  . Schizophrenia, undifferentiated (HCC) [F20.3] 05/27/2016  . Noncompliance [Z91.19] 02/15/2016  . 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 Schizophrenia (HCC) [F20.9] 11/08/2014  . Tobacco use disorder [F17.200] 06/30/2014  . 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 Hypertension [I10] 06/29/2014  . Hyponatremia [E87.1] 06/29/2014  . Laceration of neck [S11.91XA] 06/29/2014  . Suicidal behavior [R46.89] 06/29/2014   Total Time spent with patient: 20 minutes  Past Psychiatric History: schizophrenia.  Past Medical History:  Past Medical History:  Diagnosis Date  . Hypertension   . Schizophrenia [facility])     Past Surgical History:  Procedure Laterality Date  . 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 No pertinent family history. Family Psychiatric  History: none reported. Social History:  History  Alcohol Use No     History  Drug Use No    Social History   Social History  . Marital status: Single    Spouse name: N/A  . Number of children: N/A  . Years of education: N/A   Social History Main Topics  . Smoking status: Current Every Day Smoker    Packs/day: 1.00    Types: Cigarettes  . Smokeless tobacco: Current User  . Alcohol use No  . Drug use: No  . Sexual activity: Not Asked   Other Topics Concern  . None   Social History Narrative  . None   Additional Social History:  History of alcohol / drug use?: No history of alcohol / drug abuse      Current Medications: Current Facility-Administered Medications  Medication Dose Route Frequency Provider Last Rate Last Dose  . acetaminophen (TYLENOL) tablet 650 mg  650 mg Oral Q6H PRN Clapacs, John T, MD      . alum & mag hydroxide-simeth (MAALOX/MYLANTA) 200-200-20 MG/5ML suspension 30 mL  30 mL Oral Q4H PRN Clapacs, John T, MD      . chlorproMAZINE (THORAZINE)  tablet 50 mg  50 mg Oral TID PRN Latrise Bowland B, MD   50 mg at 06/18/16 2142  . cloNIDine (CATAPRES) tablet 0.1 mg  0.1 mg Oral BID PRN Darliss RidgelKapur, Aarti K, MD      . cloZAPine (CLOZARIL) tablet 100 mg  100 mg Oral Daily Clapacs, Jackquline DenmarkJohn T, MD   100 mg at 06/25/16 0849  . cloZAPine (CLOZARIL) tablet 400 mg  400 mg Oral QHS Clapacs, Jackquline DenmarkJohn T, MD   400 mg at 06/24/16 2223  . divalproex (DEPAKOTE) DR tablet 1,000 mg  1,000 mg Oral Q12H Jshon Ibe B, MD   1,000 mg at 06/25/16 0849  . LORazepam (ATIVAN) tablet 2 mg  2 mg Oral Q4H PRN Akela Pocius B, MD   2 mg at 06/24/16 0043  . magnesium hydroxide (MILK OF MAGNESIA) suspension 30 mL  30 mL Oral Daily PRN Clapacs, Jackquline DenmarkJohn T, MD   30 mL at 06/24/16 2259  . metFORMIN (GLUCOPHAGE) tablet 500 mg  500 mg Oral BID WC Lalani Winkles B, MD   500 mg at 06/25/16 0849  . senna (SENOKOT) tablet 17.2 mg  2 tablet Oral QHS Clapacs, John T, MD   17.2 mg at 06/24/16 2222  . simvastatin (ZOCOR) tablet 40 mg  40 mg Oral QHS Clapacs, Jackquline DenmarkJohn T, MD   40 mg at 06/24/16 2223  . temazepam (RESTORIL) capsule 30 mg  30 mg Oral QHS Monchel Pollitt B, MD   30 mg at 06/24/16 2223    Lab Results:  Results for orders placed or performed during the hospital encounter of 05/27/16 (from the past 48 hour(s))  CBC with Differential/Platelet     Status: None   Collection Time: 06/24/16  3:17 PM  Result Value Ref Range   WBC 5.9 3.8 - 10.6 K/uL   RBC 4.83 4.40 - 5.90 MIL/uL   Hemoglobin 14.5 13.0 - 18.0 g/dL   HCT 16.141.7 09.640.0 - 04.552.0 %   MCV 86.5 80.0 - 100.0 fL   MCH 30.0 26.0 - 34.0 pg   MCHC 34.7 32.0 - 36.0 g/dL   RDW 40.913.8 81.111.5 - 91.414.5 %   Platelets 174 150 - 440 K/uL   Neutrophils Relative % 59 %   Neutro Abs 3.5 1.4 - 6.5 K/uL   Lymphocytes Relative 28 %   Lymphs Abs 1.7 1.0 - 3.6 K/uL   Monocytes Relative 12 %   Monocytes Absolute 0.7 0.2 - 1.0 K/uL   Eosinophils Relative 0 %   Eosinophils Absolute 0.0 0 - 0.7 K/uL   Basophils Relative 1 %   Basophils  Absolute 0.0 0 - 0.1 K/uL    Blood Alcohol level:  Lab Results  Component Value Date   [facility]5 05/26/2016   ETH <5 04/26/2016    Metabolic Disorder Labs: Lab Results  Component Value Date   HGBA1C 5.6 05/28/2016   MPG 114 05/28/2016   No results found for: PROLACTIN Lab Results  Component Value Date   CHOL 139 05/28/2016  TRIG 84 05/28/2016   HDL 43 05/28/2016   CHOLHDL 3.2 05/28/2016   VLDL 17 05/28/2016   LDLCALC 79 05/28/2016   LDLCALC 74 11/08/2014    Physical Findings: AIMS: Facial and Oral Movements Muscles of Facial Expression: None, normal Lips and Perioral Area: None, normal Jaw: None, normal Tongue: None, normal,Extremity Movements Upper (arms, wrists, hands, fingers): Minimal Lower (legs, knees, ankles, toes): Minimal, Trunk Movements Neck, shoulders, hips: None, normal, Overall Severity Severity of abnormal movements (highest score from questions above): Minimal Incapacitation due to abnormal movements: None, normal Patient's awareness of abnormal movements (rate only patient's report): No Awareness, Dental Status Current problems with teeth and/or dentures?: Yes (missing teeth ) Does patient usually wear dentures?: No  CIWA:    COWS:     Musculoskeletal: Strength & Muscle Tone: within normal limits Gait & Station: normal Patient leans: N/A  Psychiatric Specialty Exam: Physical Exam  Nursing [name] and vitals reviewed. Musculoskeletal: He exhibits deformity.  Psychiatric: His affect is labile. His speech is slurred. He is hyperactive and actively hallucinating. Thought content is paranoid and delusional. Cognition and memory are impaired. He expresses impulsivity.    Review of Systems  Unable to perform ROS: Acuity of condition  Constitutional:       Left upper extremity has been amputated. VEry poor hygeine.  Psychiatric/Behavioral: Positive for hallucinations.  All other systems reviewed and are negative.   Blood pressure 111/78, pulse 98,  temperature 98.3 F (36.8 C), temperature source Oral, resp. rate 18, height 5\' 8"  (1.727 m), weight 80.3 kg (177 lb), SpO2 99 %.Body mass index is 26.91 kg/m.  General Appearance: Fairly Groomed  Eye Contact:  Good  Speech:  Garbled and Pressured  Volume:  Normal  Mood:  Irritable  Affect:  Inappropriate and Labile  Thought Process:  NA  Orientation:  Full (Time, Place, and Person)  Thought Content:  NA, Delusions, Hallucinations: Auditory and Paranoid Ideation  Suicidal Thoughts:  No  Homicidal Thoughts:  No  Memory:  Immediate;   Poor Recent;   Poor Remote;   Poor  Judgement:  Impaired  Insight:  Lacking  Psychomotor Activity:  Increased  Concentration:  Concentration: Poor and Attention Span: Poor  Recall:  Poor  Fund of Knowledge:  Poor  Language:  Poor  Akathisia:  No  Handed:  Right  AIMS (if indicated):     Assets:  Communication Skills Desire for Improvement Financial Resources/Insurance Physical Health Resilience  ADL's:  Intact  Cognition:  WNL  Sleep:  Number of Hours: 4     Treatment Plan Summary: Daily contact with patient to assess and evaluate symptoms and progress in treatment and Medication management   Jeffrey [name] is a 56 year old male with a history of schizophrenia admitted for psychotic break in the context of medication noncompliance and severe social stressors.  1. Psychosis. He was  restarted Clozapine 100 mg in the morning 400 mg at night for psychosis and Depakote for mood stabilization.   2. Dyslipidemia. He is on Lipitor.  3. Insomnia. Will switch from Trazodone to Restoril.   4. Smoking. Nicotine patch is available.  5. Metabolic syndrome monitoring. Lipid panel and TSH are normal. HgbA1C 5.6. We restarted Metformin.  6. EKG. Normal sinus rhythm, QTc 427.  7. Social. The patient has a new legal guardian.   8. Agitation. Ativan and Thorazine are available and he has been receiving prn medications.  9. Disposition. We made  referral to [facility]. He will follow up with ACT team.  I certify that the services received since the previous certification/recertification were and continue to be medically necessary as the treatment provided can be reasonably expected to improve the patient's condition; the medical record documents that the services furnished were intensive treatment services or their equivalent services, and this patient continues to need, on a daily basis, active treatment furnished directly by or requiring the supervision of inpatient psychiatric personnel.    Kristine Linea, MD 06/25/2016, 4:47 PM

## 2016-06-26 MED ORDER — MAGNESIUM CITRATE PO SOLN
1.0000 | Freq: Once | ORAL | Status: AC
  Administered 2016-06-26: 1 via ORAL
  Filled 2016-06-26: qty 296

## 2016-06-26 MED ORDER — FLEET ENEMA 7-19 GM/118ML RE ENEM
1.0000 | ENEMA | Freq: Two times a day (BID) | RECTAL | Status: DC | PRN
  Administered 2016-06-27: 1 via RECTAL
  Filled 2016-06-26: qty 1

## 2016-06-26 MED ORDER — DESMOPRESSIN ACETATE 0.2 MG PO TABS
0.2000 mg | ORAL_TABLET | Freq: Every day | ORAL | Status: DC
  Administered 2016-06-26 – 2016-07-09 (×14): 0.2 mg via ORAL
  Filled 2016-06-26 (×14): qty 1

## 2016-06-26 NOTE — Progress Notes (Signed)
D: Pt denies SI/HI/AVH, but noted responding to internal stimuli. Patient's affect is bright, his mood is pleasant, thoughts are disorganized and speech is tangential. Pt appears less anxious and he is interacting with peers and staff appropriately.  A: Pt was offered support and encouragement. Pt was given scheduled medications. Pt was encouraged to attend groups. Q 15 minute checks were done for safety.  R:Pt attends groups and interacts well with peers and staff. Pt is taking medication. Pt has no complaints.Pt receptive to treatment and safety maintained on unit.   

## 2016-06-26 NOTE — Plan of Care (Signed)
Problem: Self-Care: Goal: Ability to participate in self-care as condition permits will improve Outcome: Progressing Patient requested to do laundry today.

## 2016-06-26 NOTE — Plan of Care (Signed)
  Problem: Coping: Goal: Ability to verbalize frustrations and anger appropriately will improve Outcome: Progressing  Patient verbalized frustrations and anger to staff.  

## 2016-06-26 NOTE — Progress Notes (Signed)
CS[facility] [phone], and discussed the guardian report that they are working on his funds being available after his mother's death.  Plan continues to be group home placement long term once his funds are available.  Boneta LucksJenny is going to call [facility] act team and ask them to visit pt. Garner NashGregory Hyden Soley, MSW, LCSW Clinical Social Worker 06/26/2016 8:34 AM

## 2016-06-26 NOTE — [facility] Group Notes (Signed)
[facility] Group Notes:  (Nursing/MHT/Case Management/Adjunct)  Date:  06/26/2016  Time:  11:41 PM  Type of Therapy:  Group Therapy  Participation Level:  Active  Participation Quality:  Appropriate  Affect:  Appropriate  Cognitive:  Appropriate  Insight:  Appropriate  Engagement in Group:  None  Modes of Intervention:  Support  Summary of Progress/Problems:  Burt EkJanice Marie Crystalle Popwell 06/26/2016, 11:41 PM

## 2016-06-26 NOTE — Tx Team (Signed)
Interdisciplinary Treatment and Diagnostic Plan Update  06/26/2016 Time of Session: 1107am Jeffrey [name] MRN: [id number]  Principal Diagnosis: Schizophrenia, undifferentiated (HCC)  Secondary Diagnoses: Principal Problem:   Schizophrenia, undifferentiated (HCC) Active Problems:   Tobacco use disorder   Noncompliance   Dyslipidemia   Constipation   Current Medications:  Current Facility-Administered Medications  Medication Dose Route Frequency Provider Last Rate Last Dose  . acetaminophen (TYLENOL) tablet 650 mg  650 mg Oral Q6H PRN Clapacs, John T, MD      . alum & mag hydroxide-simeth (MAALOX/MYLANTA) 200-200-20 MG/5ML suspension 30 mL  30 mL Oral Q4H PRN Clapacs, John T, MD      . chlorproMAZINE (THORAZINE) tablet 50 mg  50 mg Oral TID PRN Pucilowska, [name], MD   50 mg at 06/18/16 2142  . cloNIDine (CATAPRES) tablet 0.1 mg  0.1 mg Oral BID PRN Darliss Ridgel, MD      . cloZAPine (CLOZARIL) tablet 100 mg  100 mg Oral Daily Clapacs, John T, MD   100 mg at 06/26/16 0830  . cloZAPine (CLOZARIL) tablet 400 mg  400 mg Oral QHS Clapacs, [name], MD   400 mg at 06/25/16 2209  . divalproex (DEPAKOTE) DR tablet 1,000 mg  1,000 mg Oral Q12H Pucilowska, [name], MD   1,000 mg at 06/26/16 0830  . LORazepam (ATIVAN) tablet 2 mg  2 mg Oral Q4H PRN Pucilowska, [name], MD   2 mg at 06/24/16 0043  . magnesium hydroxide (MILK OF MAGNESIA) suspension 30 mL  30 mL Oral Daily PRN Clapacs, [name], MD   30 mL at 06/25/16 2209  . metFORMIN (GLUCOPHAGE) tablet 500 mg  500 mg Oral BID WC Pucilowska, [name], MD   500 mg at 06/26/16 0830  . senna (SENOKOT) tablet 17.2 mg  2 tablet Oral QHS Clapacs, John T, MD   17.2 mg at 06/25/16 2209  . simvastatin (ZOCOR) tablet 40 mg  40 mg Oral QHS Clapacs, [name], MD   40 mg at 06/25/16 2209  . temazepam (RESTORIL) capsule 30 mg  30 mg Oral QHS Pucilowska, [name], MD   30 mg at 06/25/16 2209  . tuberculin injection 5 Units  5 Units Intradermal Once  Pucilowska, [name], MD       PTA Medications: Prescriptions Prior to Admission  Medication Sig Dispense Refill Last Dose  . cloZAPine (CLOZARIL) 100 MG tablet Take 1-4 tablets (100-400 mg total) by mouth 2 (two) times daily. Patient takes 1 tablet (100 mg) in the morning and 4 tablets (400 mg) at bedtime. (Patient not taking: Reported on 04/26/2016) 150 tablet 0 Not Taking at Unknown time  . paliperidone (INVEGA) 6 MG 24 hr tablet Take 6 mg by mouth 2 (two) times daily.   Not Taking at Unknown time  . senna (SENOKOT) 8.6 MG TABS tablet Take 2 tablets (17.2 mg total) by mouth at bedtime. (Patient not taking: Reported on 04/26/2016) 120 each 0 Not Taking at Unknown time  . simvastatin (ZOCOR) 40 MG tablet Take 40 mg by mouth at bedtime.   Not Taking at Unknown time  . traZODone (DESYREL) 100 MG tablet Take 200 mg by mouth at bedtime.   Not Taking at Unknown time    Patient Stressors: Marital or family conflict Medication change or noncompliance Traumatic event  Patient Strengths: Astronomer fund of knowledge Physical Health  Treatment Modalities: Medication Management, Group therapy, Case management,  1 to 1 session with clinician, Psychoeducation, Recreational  therapy.   Physician Treatment Plan for Primary Diagnosis: Schizophrenia, undifferentiated (HCC) Long Term Goal(s): Improvement in symptoms so as ready for discharge NA   Short Term Goals: Ability to identify changes in lifestyle to reduce recurrence of condition will improve Ability to verbalize feelings will improve Ability to disclose and discuss suicidal ideas Ability to demonstrate self-control will improve Ability to identify and develop effective coping behaviors will improve Compliance with prescribed medications will improve Ability to identify triggers associated with substance abuse/mental health issues will improve NA  Medication Management: Evaluate patient's response, side effects, and tolerance of  medication regimen.  Therapeutic Interventions: 1 to 1 sessions, Unit Group sessions and Medication administration.  Evaluation of Outcomes: Progressing  Physician Treatment Plan for Secondary Diagnosis: Principal Problem:   Schizophrenia, undifferentiated (HCC) Active Problems:   Tobacco use disorder   Noncompliance   Dyslipidemia   Constipation  Long Term Goal(s): Improvement in symptoms so as ready for discharge NA   Short Term Goals: Ability to identify changes in lifestyle to reduce recurrence of condition will improve Ability to verbalize feelings will improve Ability to disclose and discuss suicidal ideas Ability to demonstrate self-control will improve Ability to identify and develop effective coping behaviors will improve Compliance with prescribed medications will improve Ability to identify triggers associated with substance abuse/mental health issues will improve NA     Medication Management: Evaluate patient's response, side effects, and tolerance of medication regimen.  Therapeutic Interventions: 1 to 1 sessions, Unit Group sessions and Medication administration.  Evaluation of Outcomes: Progressing   RN Treatment Plan for Primary Diagnosis: Schizophrenia, undifferentiated (HCC) Long Term Goal(s): Knowledge of disease and therapeutic regimen to maintain health will improve  Short Term Goals: Ability to remain free from injury will improve, Ability to verbalize frustration and anger appropriately will improve, Ability to demonstrate self-control, Ability to participate in decision making will improve and Ability to verbalize feelings will improve  Medication Management: RN will administer medications as ordered by provider, will assess and evaluate patient's response and provide education to patient for prescribed medication. RN will report any adverse and/or side effects to prescribing provider.  Therapeutic Interventions: 1 on 1 counseling sessions,  Psychoeducation, Medication administration, Evaluate responses to treatment, Monitor vital signs and CBGs as ordered, Perform/monitor CIWA, COWS, AIMS and Fall Risk screenings as ordered, Perform wound care treatments as ordered.  Evaluation of Outcomes: Progressing   LCSW Treatment Plan for Primary Diagnosis: Schizophrenia, undifferentiated (HCC) Long Term Goal(s): Safe transition to appropriate next level of care at discharge, Engage patient in therapeutic group addressing interpersonal concerns.  Short Term Goals: Engage patient in aftercare planning with referrals and resources, Increase social support, Increase ability to appropriately verbalize feelings and Increase emotional regulation  Therapeutic Interventions: Assess for all discharge needs, 1 to 1 time with Social worker, Explore available resources and support systems, Assess for adequacy in community support network, Educate family and significant other(s) on suicide prevention, Complete Psychosocial Assessment, Interpersonal group therapy.  Evaluation of Outcomes: Progressing   Progress in Treatment: Attending groups: No. Participating in groups: No. Taking medication as prescribed: Yes. Toleration medication: Yes. Family/Significant other contact made: Yes, individual(s) contacted:  guardian Patient understands diagnosis: Yes. Discussing patient identified problems/goals with staff: Yes. Medical problems stabilized or resolved: Yes. Denies suicidal/homicidal ideation: Yes. Issues/concerns per patient self-inventory: No. Other: n/a  New problem(s) identified: None identified at this time.   New Short Term/Long Term Goal(s): Patient unable to set goal at this time.   Discharge Plan  or Barriers: Pt referred to [facility]. Patient is on waitlist.   Patient legal guardian stated that pt's disability money is on "hold" and will not be able to help with placement at this time.  Reason for Continuation of Hospitalization:  Aggression Depression Hallucinations  Estimated Length of Stay: 7 days.  Attendees: Patient: 06/26/2016   Physician: Dr. Jennet Maduro, MD 06/26/2016   Nursing: Leonia Reader, RN 06/26/2016   RN Care Manager: 06/26/2016   Social Worker: Daleen Squibb, LCSW 06/26/2016   Recreational Therapist: Hershal Coria, LRT/CTRS  06/26/2016   Other:  06/26/2016   Other:  06/26/2016   Other: 06/26/2016         Scribe for Treatment Team: Lorri Frederick, LCSW 06/26/2016 12:53 PM

## 2016-06-26 NOTE — Progress Notes (Signed)
Recreation Therapy Notes  Date: 06.06.18 Time: 9:30 am Location: Craft Room  Group Topic: Self-esteem  Goal Area(s) Addresses: Patient will write at least one positive trait about self. Patient will verbalize benefit of having a healthy self-esteem.   Behavioral Response: Did not attend  Intervention: I Am   Activity: Patients were given a worksheet with the letter I on it and were instructed to write as many positive traits about themselves inside the letter.  Education: LRT educated patients on ways to increase their self-esteem.   Education Outcome: Patient did not attend group.   Clinical Observations/Feedback: Patient did not attend group.  Jacquelynn CreeGreene,Megen Madewell M, LRT/CTRS 06/26/2016 10:00 AM

## 2016-06-26 NOTE — Progress Notes (Signed)
Minimal interaction.  Stays to self.  Did not eat breakfast or lunch but ate dinner.  Disheveled and has body odor.  Patient did request to do laundry today.  Medication compliant.  Support offered.  Safety maintained.

## 2016-06-26 NOTE — Progress Notes (Signed)
[facility] MD Progress Note  06/26/2016 4:35 PM Jeffrey [name]  MRN:  [id number]  Subjective:  Jeffrey [name] is a 56 year old male with schizophrenia admitted floridly psychotic in the context of treatment noncompliance following his mother's death. He is on Clozapine and Depakote improving very slowly with terrible hygiene. He has a new guardian. Awaiting transfer to [facility] or placement if improved.   06/24/2016. Jeffrey [name] is up this morning and quite agitated. He received a dose of Ativan last night. Spoke with me 5 times already demanding discharge, call sheriff Laural Benes and change his guardian to a "white women from psychiatric center on [address]". His hygiene is terrible. Last week he slept most of the time. Last night he slept 4 hours only and has been up all morning. He accepts medications and repports no side effects. Appetite is fair. No program participation.  06/25/2016.Jeffrey [name] is up and running today. He is asking for discharge with family to "his house". He seems not to understand that he will be placed in a group home. He has lived in a group home before. He refuses to see his guardian and demands a meeting with a "[facility] women from mental [facility]". Apparently this is a member of his ACT team. Unfortunatly the patient has racist tendencies. He takes medications. Sleep and appetite are fair. [facility] is unlikely to take him as they have many conditions for admission, like stable living situation. We will request case manager from his Medicaid provider and a meeting with the ACT team and the guardian.  06/26/2016. Jeffrey [name] is more animated today. Unfortunately he "his placement in a group home as he believes that he could sell his mother's house and live independently. He also refuses to go to a group home with black people. He takes medications. No side effects are reported. He took a shower and his hands. However he is an smell of urine.  Per nursing: D: Pt denies SI/HI/AVH, but noted  responding to internal stimuli. Patient's affect is bright, his mood is pleasant, thoughts are disorganized and speech is tangential. Pt appears less anxious and he is interacting with peers and staff appropriately.  A: Pt was offered support and encouragement. Pt was given scheduled medications. Pt was encouraged to attend groups. Q 15 minute checks were done for safety.  R:Pt attends groups and interacts well with peers and staff. Pt is taking medication. Pt has no complaints.Pt receptive to treatment and safety maintained on unit.  Principal Problem: Schizophrenia, undifferentiated (HCC) Diagnosis:   Patient Active Problem List   Diagnosis Date Noted  . Dyslipidemia [E78.5] 05/27/2016  . Constipation [K59.00] 05/27/2016  . Schizophrenia, undifferentiated (HCC) [F20.3] 05/27/2016  . Noncompliance [Z91.19] 02/15/2016  . 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 Schizophrenia (HCC) [F20.9] 11/08/2014  . Tobacco use disorder [F17.200] 06/30/2014  . 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 Hypertension [I10] 06/29/2014  . Hyponatremia [E87.1] 06/29/2014  . Laceration of neck [S11.91XA] 06/29/2014  . Suicidal behavior [R46.89] 06/29/2014   Total Time spent with patient: 20 minutes  Past Psychiatric History: schizophrenia.  Past Medical History:  Past Medical History:  Diagnosis Date  . Hypertension   . Schizophrenia [facility])     Past Surgical History:  Procedure Laterality Date  . 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 No pertinent family history. Family Psychiatric  History: none reported. Social History:  History  Alcohol Use No  History  Drug Use No    Social History   Social History  . Marital status: Single    Spouse name: N/A  . Number of children: N/A  . Years of education: N/A   Social History Main Topics  . Smoking status: Current Every Day Smoker    Packs/day: 1.00    Types:  Cigarettes  . Smokeless tobacco: Current User  . Alcohol use No  . Drug use: No  . Sexual activity: Not Asked   Other Topics Concern  . None   Social History Narrative  . None   Additional Social History:    History of alcohol / drug use?: No history of alcohol / drug abuse      Current Medications: Current Facility-Administered Medications  Medication Dose Route Frequency Provider Last Rate Last Dose  . acetaminophen (TYLENOL) tablet 650 mg  650 mg Oral Q6H PRN Clapacs, John T, MD      . alum & mag hydroxide-simeth (MAALOX/MYLANTA) 200-200-20 MG/5ML suspension 30 mL  30 mL Oral Q4H PRN Clapacs, John T, MD      . chlorproMAZINE (THORAZINE) tablet 50 mg  50 mg Oral TID PRN Savvy Peeters B, MD   50 mg at 06/18/16 2142  . cloNIDine (CATAPRES) tablet 0.1 mg  0.1 mg Oral BID PRN Darliss Ridgel, MD      . cloZAPine (CLOZARIL) tablet 100 mg  100 mg Oral Daily Clapacs, John T, MD   100 mg at 06/26/16 0830  . cloZAPine (CLOZARIL) tablet 400 mg  400 mg Oral QHS Clapacs, [name], MD   400 mg at 06/25/16 2209  . divalproex (DEPAKOTE) DR tablet 1,000 mg  1,000 mg Oral Q12H Anayeli Arel B, MD   1,000 mg at 06/26/16 0830  . LORazepam (ATIVAN) tablet 2 mg  2 mg Oral Q4H PRN Gurdeep Keesey B, MD   2 mg at 06/24/16 0043  . magnesium hydroxide (MILK OF MAGNESIA) suspension 30 mL  30 mL Oral Daily PRN Clapacs, [name], MD   30 mL at 06/25/16 2209  . metFORMIN (GLUCOPHAGE) tablet 500 mg  500 mg Oral BID WC Donnabelle Blanchard B, MD   500 mg at 06/26/16 0830  . senna (SENOKOT) tablet 17.2 mg  2 tablet Oral QHS Clapacs, John T, MD   17.2 mg at 06/25/16 2209  . simvastatin (ZOCOR) tablet 40 mg  40 mg Oral QHS Clapacs, [name], MD   40 mg at 06/25/16 2209  . temazepam (RESTORIL) capsule 30 mg  30 mg Oral QHS Othmar Ringer B, MD   30 mg at 06/25/16 2209  . tuberculin injection 5 Units  5 Units Intradermal Once Mikenzie Mccannon B, MD        Lab Results:  No results found for this  or any previous visit (from the past 48 hour(s)).  Blood Alcohol level:  Lab Results  Component Value Date   ETH <5 05/26/2016   ETH <5 04/26/2016    Metabolic Disorder Labs: Lab Results  Component Value Date   HGBA1C 5.6 05/28/2016   MPG 114 05/28/2016   No results found for: PROLACTIN Lab Results  Component Value Date   CHOL 139 05/28/2016   TRIG 84 05/28/2016   HDL 43 05/28/2016   CHOLHDL 3.2 05/28/2016   VLDL 17 05/28/2016   LDLCALC 79 05/28/2016   LDLCALC 74 11/08/2014    Physical Findings: AIMS: Facial and Oral Movements Muscles of Facial Expression: None, normal Lips and Perioral Area: None, normal  Jaw: None, normal Tongue: None, normal,Extremity Movements Upper (arms, wrists, hands, fingers): Minimal Lower (legs, knees, ankles, toes): Minimal, Trunk Movements Neck, shoulders, hips: None, normal, Overall Severity Severity of abnormal movements (highest score from questions above): Minimal Incapacitation due to abnormal movements: None, normal Patient's awareness of abnormal movements (rate only patient's report): No Awareness, Dental Status Current problems with teeth and/or dentures?: Yes (missing teeth ) Does patient usually wear dentures?: No  CIWA:    COWS:     Musculoskeletal: Strength & Muscle Tone: within normal limits Gait & Station: normal Patient leans: N/A  Psychiatric Specialty Exam: Physical Exam  Nursing note and vitals reviewed. Musculoskeletal: He exhibits deformity.  Psychiatric: His affect is labile. His speech is slurred. He is hyperactive and actively hallucinating. Thought content is paranoid and delusional. Cognition and memory are impaired. He expresses impulsivity.    Review of Systems  Unable to perform ROS: Acuity of condition  Constitutional:       Left upper extremity has been amputated. VEry poor hygeine.  Psychiatric/Behavioral: Positive for hallucinations.  All other systems reviewed and are negative.   Blood pressure  111/78, pulse 98, temperature 98.3 F (36.8 C), temperature source Oral, resp. rate 18, height 5\' 8"  (1.727 m), weight 80.3 kg (177 lb), SpO2 99 %.Body mass index is 26.91 kg/m.  General Appearance: Fairly Groomed  Eye Contact:  Good  Speech:  Garbled and Pressured  Volume:  Normal  Mood:  Irritable  Affect:  Inappropriate and Labile  Thought Process:  NA  Orientation:  Full (Time, Place, and Person)  Thought Content:  NA, Delusions, Hallucinations: Auditory and Paranoid Ideation  Suicidal Thoughts:  No  Homicidal Thoughts:  No  Memory:  Immediate;   Poor Recent;   Poor Remote;   Poor  Judgement:  Impaired  Insight:  Lacking  Psychomotor Activity:  Increased  Concentration:  Concentration: Poor and Attention Span: Poor  Recall:  Poor  Fund of Knowledge:  Poor  Language:  Poor  Akathisia:  No  Handed:  Right  AIMS (if indicated):     Assets:  Communication Skills Desire for Improvement Financial Resources/Insurance Physical Health Resilience  ADL's:  Intact  Cognition:  WNL  Sleep:  Number of Hours: 6     Treatment Plan Summary: Daily contact with patient to assess and evaluate symptoms and progress in treatment and Medication management   Mr. Marlett is a 56 year old male with a history of schizophrenia admitted for psychotic break in the context of medication noncompliance and severe social stressors.  1. Psychosis. He was  restarted Clozapine 100 mg in the morning 400 mg at night for psychosis and Depakote for mood stabilization.   2. Dyslipidemia. He is on Lipitor.  3. Insomnia. Will switch from Trazodone to Restoril.   4. Smoking. Nicotine patch is available.  5. Metabolic syndrome monitoring. Lipid panel and TSH are normal. HgbA1C 5.6. We restarted Metformin.  6. EKG. Normal sinus rhythm, QTc 427.  7. Social. The patient has a new legal guardian.   8. Agitation. Ativan and Thorazine are available and he has been receiving prn medications.  9.  Incontinence. We will start DDAVP.   10. Disposition. We made referral to [facility]. He will follow up with ACT team.   I certify that the services received since the previous certification/recertification were and continue to be medically necessary as the treatment provided can be reasonably expected to improve the patient's condition; the medical record documents that the services furnished were intensive treatment  services or their equivalent services, and this patient continues to need, on a daily basis, active treatment furnished directly by or requiring the supervision of inpatient psychiatric personnel.    Kristine LineaJolanta Charelle Petrakis, MD 06/26/2016, 4:35 PM

## 2016-06-27 MED ORDER — DOCUSATE SODIUM 100 MG PO CAPS
200.0000 mg | ORAL_CAPSULE | Freq: Two times a day (BID) | ORAL | Status: DC
  Administered 2016-06-27 – 2016-07-10 (×26): 200 mg via ORAL
  Filled 2016-06-27 (×26): qty 2

## 2016-06-27 MED ORDER — POLYETHYLENE GLYCOL 3350 17 G PO PACK
17.0000 g | PACK | Freq: Every day | ORAL | Status: DC
  Administered 2016-06-27 – 2016-07-09 (×5): 17 g via ORAL
  Filled 2016-06-27 (×8): qty 1

## 2016-06-27 NOTE — Plan of Care (Signed)
Problem: Bowel/Gastric: Goal: Will not experience complications related to bowel motility Outcome: Progressing Complained of constipation last night and this morning. Pt had bowel movement today after pt given mag citrate, colace, Miralax and fleets enema. Encouraged fluids.

## 2016-06-27 NOTE — [facility] Group Notes (Signed)
[facility] LCSW Group Therapy  06/27/2016 2:25 PM  Type of Therapy:  Group Therapy  Participation Level:  Patient did not attend group. CSW invited patient to group.   Summary of Progress/Problems: Balance in life: Patients will discuss the concept of balance and how it looks and feels to be unbalanced. Pt will identify areas in their life that is unbalanced and ways to become more balanced. They discussed what aspects in their lives has influenced their self care. Patients also discussed self care in the areas of self regulation/control, hygiene/appearance, sleep/relaxation, healthy leisure, healthy eating habits, exercise, inner peace/spirituality, self improvement, sobriety, and health management. They were challenged to identify changes that are needed in order to improve self care.  Mihir Flanigan G. Garnette CzechSampson MSW, LCSWA 06/27/2016, 2:25 PM

## 2016-06-27 NOTE — Progress Notes (Signed)
[facility], TB skin test was not administered that is to be read today. MD and SW informed. Pt is voicing refusal of going to group home on discharge, stating "I have a home." Discharge disposition still pending per MD and SW. Will continue to monitor.

## 2016-06-27 NOTE — Progress Notes (Addendum)
[facility] MD Progress Note  06/27/2016 11:06 AM Jeffrey [name]  MRN:  [id number]  Subjective:  Jeffrey [name] is a 56 year old male with schizophrenia admitted floridly psychotic in the context of treatment noncompliance following his mother's death. He is on Clozapine and Depakote improving very slowly with terrible hygiene. He has a new guardian. Awaiting transfer to [facility] or placement if improved.   06/24/2016. Jeffrey [name] is up this morning and quite agitated. He received a dose of Ativan last night. Spoke with me 5 times already demanding discharge, call sheriff Laural Benes and change his guardian to a "white women from psychiatric center on [address]". His hygiene is terrible. Last week he slept most of the time. Last night he slept 4 hours only and has been up all morning. He accepts medications and repports no side effects. Appetite is fair. No program participation.  06/25/2016.Jeffrey [name] is up and running today. He is asking for discharge with family to "his house". He seems not to understand that he will be placed in a group home. He has lived in a group home before. He refuses to see his guardian and demands a meeting with a "[facility] women from mental health [facility]". Apparently this is a member of his ACT team. Unfortunatly the patient has racist tendencies. He takes medications. Sleep and appetite are fair. [facility] is unlikely to take him as they have many conditions for admission, like stable living situation. We will request case manager from his Medicaid provider and a meeting with the ACT team and the guardian.  06/26/2016. Jeffrey [name] is more animated today. Unfortunately he "his placement in a group home as he believes that he could sell his mother's house and live independently. He also refuses to go to a group home with black people. He takes medications. No side effects are reported. He took a shower and his hands. However his pants smell of urine.  06/27/2016. Jeffrey [name] is constipated today. He did not  respond to Mag Citrate. Fleet enema today. He denies any symptoms of depression, anxiety or psychosis. He is still talking to himself but closer to baseline. He is still delusional and refuses placement. He seems not to understand that he has a guardian now, wants to "sell his mother's house". Accepts medications. Hygiene is improving but refuses to wash cloths.  Per nursing: D: Pt denies SI/HI/AVH. Pt is pleasant and cooperative, affect is labile. . Pt stated he feels constipated and would prefer a magnesium citrate, MD on call notified and magnesium was citrate was given, in addition patient was encouraged to increase fluids. Patient appears less anxious and he is interacting with peers and staff appropriately.  A: Pt was offered support and encouragement. Pt was given scheduled medications. Pt was encouraged to attend groups. Q 15 minute checks were done for safety.  R:Pt attends groups and interacts well with peers and staff. Pt is taking medication. Pt receptive to treatment and safety maintained on unit, will continue to closely monitor.   Approached pt with fleet's enema ordered PRN. Pt refuses to get out of bed right now, states, "just put it in the bathroom, I'll do it in a little bit." Informed pt that this nurse would keep the dose at the nurse's station, dose available when he is ready. Verbalized understanding. Safety maintained. Will continue to monitor.  Principal Problem: Schizophrenia, undifferentiated (HCC) Diagnosis:   Patient Active Problem List   Diagnosis Date Noted  . Dyslipidemia [E78.5] 05/27/2016  . Constipation [K59.00] 05/27/2016  .  Schizophrenia, undifferentiated (HCC) [F20.3] 05/27/2016  . Noncompliance [Z91.19] 02/15/2016  . 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 Schizophrenia (HCC) [F20.9] 11/08/2014  . Tobacco use disorder [F17.200] 06/30/2014  . 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 Hypertension [I10] 06/29/2014  . Hyponatremia [E87.1]  06/29/2014  . Laceration of neck [S11.91XA] 06/29/2014  . Suicidal behavior [R46.89] 06/29/2014   Total Time spent with patient: 20 minutes  Past Psychiatric History: schizophrenia.  Past Medical History:  Past Medical History:  Diagnosis Date  . Hypertension   . Schizophrenia [facility](HCC)     Past Surgical History:  Procedure Laterality Date  . 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 No pertinent family history. Family Psychiatric  History: none reported. Social History:  History  Alcohol Use No     History  Drug Use No    Social History   Social History  . Marital status: Single    Spouse name: N/A  . Number of children: N/A  . Years of education: N/A   Social History Main Topics  . Smoking status: Current Every Day Smoker    Packs/day: 1.00    Types: Cigarettes  . Smokeless tobacco: Current User  . Alcohol use No  . Drug use: No  . Sexual activity: Not Asked   Other Topics Concern  . None   Social History Narrative  . None   Additional Social History:    History of alcohol / drug use?: No history of alcohol / drug abuse      Current Medications: Current Facility-Administered Medications  Medication Dose Route Frequency Provider Last Rate Last Dose  . acetaminophen (TYLENOL) tablet 650 mg  650 mg Oral Q6H PRN Clapacs, John T, MD      . alum & mag hydroxide-simeth (MAALOX/MYLANTA) 200-200-20 MG/5ML suspension 30 mL  30 mL Oral Q4H PRN Clapacs, John T, MD      . chlorproMAZINE (THORAZINE) tablet 50 mg  50 mg Oral TID PRN Wagner Tanzi B, MD   50 mg at 06/26/16 2200  . cloNIDine (CATAPRES) tablet 0.1 mg  0.1 mg Oral BID PRN Darliss RidgelKapur, Aarti K, MD      . cloZAPine (CLOZARIL) tablet 100 mg  100 mg Oral Daily Clapacs, Jackquline DenmarkJohn T, MD   100 mg at 06/27/16 0846  . cloZAPine (CLOZARIL) tablet 400 mg  400 mg Oral QHS Clapacs, Jackquline DenmarkJohn T, MD   400 mg at 06/26/16 2159  . desmopressin (DDAVP) tablet 0.2 mg  0.2 mg  Oral QHS Luana Tatro B, MD   0.2 mg at 06/26/16 2200  . divalproex (DEPAKOTE) DR tablet 1,000 mg  1,000 mg Oral Q12H Zaryah Seckel B, MD   1,000 mg at 06/27/16 0846  . LORazepam (ATIVAN) tablet 2 mg  2 mg Oral Q4H PRN Lashala Laser B, MD   2 mg at 06/24/16 0043  . magnesium hydroxide (MILK OF MAGNESIA) suspension 30 mL  30 mL Oral Daily PRN Clapacs, Jackquline DenmarkJohn T, MD   30 mL at 06/25/16 2209  . metFORMIN (GLUCOPHAGE) tablet 500 mg  500 mg Oral BID WC Sharnell Knight B, MD   500 mg at 06/27/16 0846  . senna (SENOKOT) tablet 17.2 mg  2 tablet Oral QHS Clapacs, John T, MD   17.2 mg at 06/26/16 2159  . simvastatin (ZOCOR) tablet 40 mg  40 mg Oral QHS Clapacs, John T, MD   40 mg at 06/26/16 2200  . sodium phosphate (  FLEET) 7-19 GM/118ML enema 1 enema  1 enema Rectal Q12H PRN Clapacs, John T, MD      . temazepam (RESTORIL) capsule 30 mg  30 mg Oral QHS Angelina Neece B, MD   30 mg at 06/26/16 2159  . tuberculin injection 5 Units  5 Units Intradermal Once Kjersti Dittmer B, MD        Lab Results:  No results found for this or any previous visit (from the past 48 hour(s)).  Blood Alcohol level:  Lab Results  Component Value Date   ETH <5 05/26/2016   ETH <5 04/26/2016    Metabolic Disorder Labs: Lab Results  Component Value Date   HGBA1C 5.6 05/28/2016   MPG 114 05/28/2016   No results found for: PROLACTIN Lab Results  Component Value Date   CHOL 139 05/28/2016   TRIG 84 05/28/2016   HDL 43 05/28/2016   CHOLHDL 3.2 05/28/2016   VLDL 17 05/28/2016   LDLCALC 79 05/28/2016   LDLCALC 74 11/08/2014    Physical Findings: AIMS: Facial and Oral Movements Muscles of Facial Expression: None, normal Lips and Perioral Area: None, normal Jaw: None, normal Tongue: None, normal,Extremity Movements Upper (arms, wrists, hands, fingers): Minimal Lower (legs, knees, ankles, toes): Minimal, Trunk Movements Neck, shoulders, hips: None, normal, Overall Severity Severity  of abnormal movements (highest score from questions above): Minimal Incapacitation due to abnormal movements: None, normal Patient's awareness of abnormal movements (rate only patient's report): No Awareness, Dental Status Current problems with teeth and/or dentures?: Yes (missing teeth ) Does patient usually wear dentures?: No  CIWA:    COWS:     Musculoskeletal: Strength & Muscle Tone: within normal limits Gait & Station: normal Patient leans: N/A  Psychiatric Specialty Exam: Physical Exam  Nursing note and vitals reviewed. Musculoskeletal: He exhibits deformity.  Psychiatric: His affect is labile. His speech is slurred. He is hyperactive and actively hallucinating. Thought content is paranoid and delusional. Cognition and memory are impaired. He expresses impulsivity.    Review of Systems  Unable to perform ROS: Acuity of condition  Constitutional:       Left upper extremity has been amputated. VEry poor hygeine.  Psychiatric/Behavioral: Positive for hallucinations.  All other systems reviewed and are negative.   Blood pressure 111/78, pulse 98, temperature 98.3 F (36.8 C), temperature source Oral, resp. rate 18, height 5\' 8"  (1.727 m), weight 80.3 kg (177 lb), SpO2 99 %.Body mass index is 26.91 kg/m.  General Appearance: Fairly Groomed  Eye Contact:  Good  Speech:  Garbled and Pressured  Volume:  Normal  Mood:  Irritable  Affect:  Inappropriate and Labile  Thought Process:  NA  Orientation:  Full (Time, Place, and Person)  Thought Content:  NA, Delusions, Hallucinations: Auditory and Paranoid Ideation  Suicidal Thoughts:  No  Homicidal Thoughts:  No  Memory:  Immediate;   Poor Recent;   Poor Remote;   Poor  Judgement:  Impaired  Insight:  Lacking  Psychomotor Activity:  Increased  Concentration:  Concentration: Poor and Attention Span: Poor  Recall:  Poor  Fund of Knowledge:  Poor  Language:  Poor  Akathisia:  No  Handed:  Right  AIMS (if indicated):      Assets:  Communication Skills Desire for Improvement Financial Resources/Insurance Physical Health Resilience  ADL's:  Intact  Cognition:  WNL  Sleep:  Number of Hours: 6     Treatment Plan Summary: Daily contact with patient to assess and evaluate symptoms and progress in treatment  and Medication management   Jeffrey [name] is a 56 year old male with a history of schizophrenia admitted for psychotic break in the context of medication noncompliance and severe social stressors.  1. Psychosis. He was  restarted Clozapine 100 mg in the morning 400 mg at night for psychosis and Depakote for mood stabilization.   2. Dyslipidemia. He is on Lipitor.  3. Insomnia. Will switch from Trazodone to Restoril.   4. Smoking. Nicotine patch is available.  5. Metabolic syndrome monitoring. Lipid panel and TSH are normal. HgbA1C 5.6. We restarted Metformin.  6. EKG. Normal sinus rhythm, QTc 427.  7. Social. The patient has a new legal guardian.   8. Agitation. Ativan and Thorazine are available and he has been receiving prn medications.  9. Urinary incontinence. We will start DDAVP.   10. Constipation. Will start bowel regimen.   11. Disposition. We made referral to [facility]. He will follow up with ACT team.      Kristine Linea, MD 06/27/2016, 11:06 AM

## 2016-06-27 NOTE — [facility] Group Notes (Signed)
  [facility] LCSW Group Therapy Note  Date/Time: 06/26/16, 1300 late entry note  Type of Therapy/Topic:  Group Therapy:  Emotion Regulation  Participation Level:  Did Not Attend   Mood:  Description of Group:    The purpose of this group is to assist patients in learning to regulate negative emotions and experience positive emotions. Patients will be guided to discuss ways in which they have been vulnerable to their negative emotions. These vulnerabilities will be juxtaposed with experiences of positive emotions or situations, and patients challenged to use positive emotions to combat negative ones. Special emphasis will be placed on coping with negative emotions in conflict situations, and patients will process healthy conflict resolution skills.  Therapeutic Goals: 1. Patient will identify two positive emotions or experiences to reflect on in order to balance out negative emotions:  2. Patient will label two or more emotions that they find the most difficult to experience:  3. Patient will be able to demonstrate positive conflict resolution skills through discussion or role plays:   Summary of Patient Progress:       Therapeutic Modalities:   Cognitive Behavioral Therapy Feelings Identification Dialectical Behavioral Therapy  Greg Zohar Maroney, LCSW 

## 2016-06-27 NOTE — Progress Notes (Signed)
Approached pt with fleet's enema ordered PRN. Pt refuses to get out of bed right now, states, "just put it in the bathroom, I'll do it in a little bit." Informed pt that this nurse would keep the dose at the nurse's station, dose available when he is ready. Verbalized understanding. Safety maintained. Will continue to monitor.

## 2016-06-27 NOTE — Progress Notes (Signed)
Patient out of bed with obvious feces on his clothing to meet with group home as scheduled with social worker (this nurse unaware of scheduled meeting), then patient went straight back to bed. This nurse and a MHT approached patient with clean scrubs, hygiene products and towels provided. Much encouragement provided for patient to at least allow nurse to take soiled clothing for wash and change linens because they are covered in feces. Pt refuses, stating "I don't want to right now, I just want to lay in my bed. I'm thinking about my mama, just leave me alone." Pt irritable with this nurse, refuses to get out of bed and remove soiled clothing. Safety maintained. Will continue to monitor.

## 2016-06-27 NOTE — Progress Notes (Signed)
Pt continues to complain of constipation. Denies having a bowel movement after dose of mag citrate (see MAR). Requesting enema. Order available for enema PRN, will give and continue to monitor.

## 2016-06-27 NOTE — Progress Notes (Signed)
Recreation Therapy Notes  Date: 06.07.18 Time: 9:30 am Location: Craft Room  Group Topic: Leisure Education  Goal Area(s) Addresses:  Patient will identify things they are grateful for. Patient will identify how being grateful can influence decision making.  Behavioral Response: Did not attend  Intervention: Grateful Wheel  Activity: Patients were given an I Am Grateful For worksheet and were instructed to write things they are grateful for under each category.   Education: LRT educated patients on leisure.  Education Outcome: Patient did not attend group.  Clinical Observations/Feedback: Patient did not attend group.  Jacquelynn CreeGreene,Yuna Pizzolato M, LRT/CTRS 06/27/2016 10:10 AM

## 2016-06-27 NOTE — Progress Notes (Signed)
Pt met with Lavada Mesi of [facility] center [facility].  PASSR also scheduled to take place Friday AM, 06/28/16. Winferd Humphrey, MSW, LCSW Clinical Social Worker 06/27/2016 2:50 PM

## 2016-06-27 NOTE — Progress Notes (Signed)
D: Pt denies SI/HI/AVH. Pt is pleasant and cooperative, affect is labile. . Pt stated he feels constipated and would prefer a magnesium citrate, MD on call notified and magnesium was citrate was given, in addition patient was encouraged to increase fluids. Patient appears less anxious and he is interacting with peers and staff appropriately.  A: Pt was offered support and encouragement. Pt was given scheduled medications. Pt was encouraged to attend groups. Q 15 minute checks were done for safety.  R:Pt attends groups and interacts well with peers and staff. Pt is taking medication. Pt receptive to treatment and safety maintained on unit, will continue to closely monitor.

## 2016-06-27 NOTE — Progress Notes (Signed)
Pt back to nurse's station reporting effectiveness from enema. This nurse went into pt's bathroom where evidence of bowel movement was present, obtained plastic enema bottle from trash for safety. Pt requesting another enema, informed pt that he could have another dose in 12 hours as ordered PRN. Will continue to monitor.

## 2016-06-27 NOTE — [facility] Group Notes (Signed)
[facility] LCSW Group Therapy Note  Type of Therapy and Topic:  Group Therapy:  Goals Group: SMART Goals  Participation Level:  Patient did not attend group. CSW invited patient to group.   Description of Group:   The purpose of a daily goals group is to assist and guide patients in setting recovery/wellness-related goals.  The objective is to set goals as they relate to the crisis in which they were admitted. Patients will be using SMART goal modalities to set measurable goals.  Characteristics of realistic goals will be discussed and patients will be assisted in setting and processing how one will reach their goal. Facilitator will also assist patients in applying interventions and coping skills learned in psycho-education groups to the SMART goal and process how one will achieve defined goal.  Therapeutic Goals: -Patients will develop and document one goal related to or their crisis in which brought them into treatment. -Patients will be guided by LCSW using SMART goal setting modality in how to set a measurable, attainable, realistic and time sensitive goal.  -Patients will process barriers in reaching goal. -Patients will process interventions in how to overcome and successful in reaching goal.   Summary of Patient Progress:  Patient Goal: Patient did not attend group. CSW invited patient to group.   Therapeutic Modalities:   Motivational Interviewing Engineer, manufacturing systemsCognitive Behavioral Therapy Crisis Intervention Model SMART goals setting  Iylah Dworkin G. Garnette CzechSampson MSW, LCSWA 06/27/2016 11:06 AM

## 2016-06-27 NOTE — [facility] Counselor (Signed)
Adult Comprehensive Assessment  Patient ID: Jeffrey [name], male   DOB: 1960-06-23, 56 y.o.   MRN: [id number]  Information Source: Information source:  Previous assessment and from patient.  Current Stressors:  Educational / Learning stressors: None reported  Employment / Job issues: None reported. Family Relationships: Pt lives in mother's home.  Housing / Lack of housing: Patient is going to have to move to a group home although he is reporting this is OK currently. Physical health (include injuries & life threatening diseases): None reported  Social relationships: None reported  Substance abuse: None reported  Bereavement / Loss: Death of mother is significant current stressor at this time.  Living/Environment/Situation:  Living Arrangements: Mother's home. Living conditions (as described by patient or guardian): Unable to care for himself.  How long has patient lived in current situation?: His entire life.  What is atmosphere in current home: Unable to care for himself at this time.  Family History:  Marital status: Single Does patient have children?: No  Childhood History:  By whom was/is the patient raised?: Mother Description of patient's relationship with caregiver when they were a child: Good  Patient's description of current relationship with people who raised him/her: Mother passed away when patient was hospitalized.  Does patient have siblings?: No Did patient suffer any verbal/emotional/physical/sexual abuse as a child?: No Did patient suffer from severe childhood neglect?: No Has patient ever been sexually abused/assaulted/raped as an adolescent or adult?: No Was the patient ever a victim of a crime or a disaster?: No Witnessed domestic violence?: No Has patient been effected by domestic violence as an adult?: No  Education:  Highest grade of school patient has completed: high school  Currently a Consulting civil engineerstudent?: No Learning disability?: No  Employment/Work  Situation:   Employment situation: On disability Why is patient on disability: Schizophrenia  How long has patient been on disability: Many years  Patient's job has been impacted by current illness: No What is the longest time patient has a held a job?: NA Where was the patient employed at that time?: NA  Has patient ever been in the [employer] and Companymilitary?: No  Financial Resources:   Surveyor, quantityinancial resources: Insurance claims handlereceives SSDI Does patient have a Lawyerrepresentative payee or guardian?: Yes Name of representative payee or guardian: Guardian ad litam   Alcohol/Substance Abuse:   What has been your use of drugs/alcohol within the last 12 months?: Pt denies  Alcohol/Substance Abuse Treatment Hx: Denies past history Has alcohol/substance abuse ever caused legal problems?: No  Social Support System:   Patient's Community Support System: Good Describe Community Support System: ACT Team  Type of faith/religion: Christianity  How does patient's faith help to cope with current illness?: Comfort   Leisure/Recreation:   Leisure and Hobbies: walking around, watching tv   Strengths/Needs:   In what areas does patient struggle / problems for patient: Non compliance on medications, aggression   Discharge Plan:   Does patient have access to transportation?: Yes Will patient be returning to same living situation after discharge?: Yes Currently receiving community mental health services: Yes (From Whom) ([facility] ) Does patient have financial barriers related to discharge medications?: No  Summary/Recommendations:   Jeffrey [name] is a 56 year old male who has [facility] and is due for reassessment. Patient's mother who was his only support has passed away and pt now has state appointed guardian.   Patient is unable to care for himself, has been on the waiting list for the state hospital  for several weeks.  Group home placement is also being pursued at this time.   He receives  outpatient services at [facility] and will continue those services at discharge. Recommendations include crisis stabilization, medication management, therapeutic milieu and encourage group attendance and participation.   Daleen Squibb, MSW, LCSW 06/27/2016, 1530.

## 2016-06-27 NOTE — Progress Notes (Signed)
Pt got out of bed and changed his clothing, brought clothing to nurse to be washed in laundry room. Allowed this nurse to change his linens. Dirty linens obviously soaked in sweat, urine and had feces evident on them as well. Wiped bed down and bathroom down with bleach cleansing wipes. Clean linens on patient's bed, bathroom without urine/feces on the toilet. Safety maintained. Will continue to monitor.

## 2016-06-27 NOTE — Progress Notes (Signed)
Pt to nurse requesting fleet's enema as ordered. Pt requesting to administer enema himself, "just get it ready for me and shut the door, I can do it myself, I've done it before." This nurse went to pt's room,  removed seal and orange colored top of enema and removed from pt room, left patient's room and shut the door to patient's room for privacy. Informed pt to let nurse know of results, will reassess. Safety maintained. Will continue to monitor.

## 2016-06-28 NOTE — Progress Notes (Signed)
PASSR screener Verlon AuLeslie completed screening.  Pt refused to come out of his room to meet with her but Verlon AuLeslie said she can complete the screening based of available information.  [phone]. Garner NashGregory Nydia Ytuarte, MSW, LCSW Clinical Social Worker 06/28/2016 9:17 AM

## 2016-06-28 NOTE — [facility] Group Notes (Signed)
[facility] Group Notes:  (Nursing/MHT/Case Management/Adjunct)  Date:  06/28/2016  Time:  12:47 AM  Type of Therapy:  Group Therapy  Participation Level:  Active  Participation Quality:  Appropriate  Affect:  Appropriate  Cognitive:  Appropriate  Insight:  Good  Engagement in Group:  Supportive  Modes of Intervention:  Support  Summary of Progress/Problems:  Jeffrey [name] 06/28/2016, 12:47 AM

## 2016-06-28 NOTE — Progress Notes (Signed)
Pt. Isolative to room for majority of shift.  In morning med pass, pt awakes and tells nurse to pour meds in his mouth instead of self administering, then returns to sleep.   Refuses to participate in milieu.  Remains disorganized, illogical.  Remains disheveled although body odor has improved since shower yesterday.  During 1700 med pass, pt talks/cusses to self angrily.  Statements are disorganized and pt refuses to elaborate thoughts/feelings with this RN.  Returns to room, remains isolative.

## 2016-06-28 NOTE — Progress Notes (Addendum)
[facility] MD Progress Note  06/28/2016 12:19 PM Jeffrey [name]  MRN:  [id number]  Subjective:  [name] is a 56 year old male with schizophrenia admitted floridly psychotic in the context of treatment noncompliance following his mother's death. He is on Clozapine and Depakote improving very slowly with terrible hygiene. He has a new guardian. Awaiting transfer to [facility] or placement if improved.   06/24/2016. [name] is up this morning and quite agitated. He received a dose of Ativan last night. Spoke with me 5 times already demanding discharge, call sheriff Wynetta Emery and change his guardian to a "white women from psychiatric center on [address]". His hygiene is terrible. Last week he slept most of the time. Last night he slept 4 hours only and has been up all morning. He accepts medications and repports no side effects. Appetite is fair. No program participation.  06/25/2016.[name] is up and running today. He is asking for discharge with family to "his house". He seems not to understand that he will be placed in a group home. He has lived in a group home before. He refuses to see his guardian and demands a meeting with a "red [facility] women from mental health center". Apparently this is a member of his ACT team. Unfortunatly the patient has racist tendencies. He takes medications. Sleep and appetite are fair. [facility] is unlikely to take him as they have many conditions for admission, like stable living situation. We will request case manager from his Medicaid provider and a meeting with the ACT team and the guardian.  06/26/2016. [name] is more animated today. Unfortunately he "his placement in a group home as he believes that he could sell his mother's house and live independently. He also refuses to go to a group home with black people. He takes medications. No side effects are reported. He took a shower and his hands. However his pants smell of urine.  06/27/2016. [name] is constipated today. He did not  respond to Mag Citrate. Fleet enema today. He denies any symptoms of depression, anxiety or psychosis. He is still talking to himself but closer to baseline. He is still delusional and refuses placement. He seems not to understand that he has a guardian now, wants to "sell his mother's house". Accepts medications. Hygiene is improving but refuses to wash cloths.  07/28/2016.[name] much better today. He has showered and changed his clothes. This is mostly due to severe diarrhea after he was given laxatives. At last, he was able to recognize the need for hygiene. He met with prospective group home owner yesterday. It did go well except for the fact that he was soiled. He takes medications and has no complaints. He is secluded to his room and skips meals. He did not eat lunch today. His thinking continues to improve and he is now able to hold a brief almost sensible conversation.  Per nursing: D: Pt denies SI/HI/AVH, but noted responding to internal stimuli.  Pt is pleasant and cooperative. Pt stated he feels better from taking magnesium citrate also enema and he had a bowel movement on this shift. Patient is interacting with peers and staff appropriately.  A: Pt was offered support and encouragement. Pt was given scheduled medications. Pt was encouraged to attend groups. Q 15 minute checks were done for safety.  R:Pt attends groups and interacts well with peers and staff. Pt is taking medication. Pt has no complaints.Pt receptive to treatment and safety maintained on unit.  Principal Problem: Schizophrenia,  undifferentiated ([place]) Diagnosis:   Patient Active Problem List   Diagnosis Date Noted  . Dyslipidemia [E78.5] 05/27/2016  . Constipation [K59.00] 05/27/2016  . Schizophrenia, undifferentiated ([place]) [F20.3] 05/27/2016  . Noncompliance [Z91.19] 02/15/2016  . 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 Schizophrenia ([place]) [F20.9] 11/08/2014  . Tobacco use disorder [F17.200] 06/30/2014  .  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 Hypertension [I10] 06/29/2014  . Hyponatremia [E87.1] 06/29/2014  . Laceration of neck [S11.91XA] 06/29/2014  . Suicidal behavior [R46.89] 06/29/2014   Total Time spent with patient: 20 minutes  Past Psychiatric History: schizophrenia.  Past Medical History:  Past Medical History:  Diagnosis Date  . Hypertension   . Schizophrenia [facility])     Past Surgical History:  Procedure Laterality Date  . 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 No pertinent family history. Family Psychiatric  History: none reported. Social History:  History  Alcohol Use No     History  Drug Use No    Social History   Social History  . Marital status: Single    Spouse name: N/A  . Number of children: N/A  . Years of education: N/A   Social History Main Topics  . Smoking status: Current Every Day Smoker    Packs/day: 1.00    Types: Cigarettes  . Smokeless tobacco: Current User  . Alcohol use No  . Drug use: No  . Sexual activity: Not Asked   Other Topics Concern  . None   Social History Narrative  . None   Additional Social History:    History of alcohol / drug use?: No history of alcohol / drug abuse      Current Medications: Current Facility-Administered Medications  Medication Dose Route Frequency Provider Last Rate Last Dose  . acetaminophen (TYLENOL) tablet 650 mg  650 mg Oral Q6H PRN Clapacs, John T, MD      . alum & mag hydroxide-simeth (MAALOX/MYLANTA) 200-200-20 MG/5ML suspension 30 mL  30 mL Oral Q4H PRN Clapacs, John T, MD      . chlorproMAZINE (THORAZINE) tablet 50 mg  50 mg Oral TID PRN Tanishka Drolet B, MD   50 mg at 06/27/16 2306  . cloNIDine (CATAPRES) tablet 0.1 mg  0.1 mg Oral BID PRN Chauncey Mann, MD      . cloZAPine (CLOZARIL) tablet 100 mg  100 mg Oral Daily Clapacs, John T, MD   100 mg at 06/28/16 0930  . cloZAPine (CLOZARIL) tablet 400 mg  400  mg Oral QHS Clapacs, John T, MD   400 mg at 06/27/16 2200  . desmopressin (DDAVP) tablet 0.2 mg  0.2 mg Oral QHS Caitlin Ainley B, MD   0.2 mg at 06/27/16 2200  . divalproex (DEPAKOTE) DR tablet 1,000 mg  1,000 mg Oral Q12H Dewan Emond B, MD   1,000 mg at 06/28/16 0929  . docusate sodium (COLACE) capsule 200 mg  200 mg Oral BID Mujahid Jalomo B, MD   200 mg at 06/28/16 0929  . LORazepam (ATIVAN) tablet 2 mg  2 mg Oral Q4H PRN Britney Newstrom B, MD   2 mg at 06/24/16 0043  . magnesium hydroxide (MILK OF MAGNESIA) suspension 30 mL  30 mL Oral Daily PRN Clapacs, Madie Reno, MD   30 mL at 06/25/16 2209  . metFORMIN (GLUCOPHAGE) tablet 500 mg  500 mg Oral BID WC Merit Maybee B, MD   500 mg at 06/28/16 0929  .  polyethylene glycol (MIRALAX / GLYCOLAX) packet 17 g  17 g Oral Daily [place] Basham B, MD   17 g at 06/27/16 1156  . senna (SENOKOT) tablet 17.2 mg  2 tablet Oral QHS Clapacs, John T, MD   17.2 mg at 06/27/16 2300  . simvastatin (ZOCOR) tablet 40 mg  40 mg Oral QHS Clapacs, John T, MD   40 mg at 06/27/16 2300  . sodium phosphate (FLEET) 7-19 GM/118ML enema 1 enema  1 enema Rectal Q12H PRN Clapacs, Madie Reno, MD   1 enema at 06/27/16 1732  . temazepam (RESTORIL) capsule 30 mg  30 mg Oral QHS Deseri Loss B, MD   30 mg at 06/27/16 2200  . tuberculin injection 5 Units  5 Units Intradermal Once Brieonna Crutcher B, MD        Lab Results:  No results found for this or any previous visit (from the past 48 hour(s)).  Blood Alcohol level:  Lab Results  Component Value Date   ETH <5 05/26/2016   ETH <5 78/67/6720    Metabolic Disorder Labs: Lab Results  Component Value Date   HGBA1C 5.6 05/28/2016   MPG 114 05/28/2016   No results found for: PROLACTIN Lab Results  Component Value Date   CHOL 139 05/28/2016   TRIG 84 05/28/2016   HDL 43 05/28/2016   CHOLHDL 3.2 05/28/2016   VLDL 17 05/28/2016   LDLCALC 79 05/28/2016   LDLCALC 74 11/08/2014     Physical Findings: AIMS: Facial and Oral Movements Muscles of Facial Expression: None, normal Lips and Perioral Area: None, normal Jaw: None, normal Tongue: None, normal,Extremity Movements Upper (arms, wrists, hands, fingers): Minimal Lower (legs, knees, ankles, toes): Minimal, Trunk Movements Neck, shoulders, hips: None, normal, Overall Severity Severity of abnormal movements (highest score from questions above): Minimal Incapacitation due to abnormal movements: None, normal Patient's awareness of abnormal movements (rate only patient's report): No Awareness, Dental Status Current problems with teeth and/or dentures?: Yes (missing teeth ) Does patient usually wear dentures?: No  CIWA:    COWS:     Musculoskeletal: Strength & Muscle Tone: within normal limits Gait & Station: normal Patient leans: N/A  Psychiatric Specialty Exam: Physical Exam  Nursing note and vitals reviewed. Musculoskeletal: He exhibits deformity.  Psychiatric: His affect is labile. His speech is slurred. He is hyperactive and actively hallucinating. Thought content is paranoid and delusional. Cognition and memory are impaired. He expresses impulsivity.    Review of Systems  Unable to perform ROS: Acuity of condition  Constitutional:       Left upper extremity has been amputated. VEry poor hygeine.  Psychiatric/Behavioral: Positive for hallucinations.  All other systems reviewed and are negative.   Blood pressure 130/80, pulse 82, temperature 98.1 F (36.7 C), temperature source Oral, resp. rate 18, height 5' 8"  (1.727 m), weight 80.3 kg (177 lb), SpO2 99 %.Body mass index is 26.91 kg/m.  General Appearance: Fairly Groomed  Eye Contact:  Good  Speech:  Garbled and Pressured  Volume:  Normal  Mood:  Irritable  Affect:  Inappropriate and Labile  Thought Process:  NA  Orientation:  Full (Time, Place, and Person)  Thought Content:  NA, Delusions, Hallucinations: Auditory and Paranoid Ideation   Suicidal Thoughts:  No  Homicidal Thoughts:  No  Memory:  Immediate;   Poor Recent;   Poor Remote;   Poor  Judgement:  Impaired  Insight:  Lacking  Psychomotor Activity:  Increased  Concentration:  Concentration: Poor and Attention Span: Poor  Recall:  Poor  Fund of Knowledge:  Poor  Language:  Poor  Akathisia:  No  Handed:  Right  AIMS (if indicated):     Assets:  Communication Skills Desire for Improvement Financial Resources/Insurance Physical Health Resilience  ADL's:  Intact  Cognition:  WNL  Sleep:  Number of Hours: 6     Treatment Plan Summary: Daily contact with patient to assess and evaluate symptoms and progress in treatment and Medication management   Mr. Monarch is a 56 year old male with a history of schizophrenia admitted for psychotic break in the context of medication noncompliance and severe social stressors.  1. Psychosis. He was  restarted Clozapine 100 mg in the morning 400 mg at night for psychosis and Depakote for mood stabilization.   2. Dyslipidemia. He is on Lipitor.  3. Insomnia. Will switch from Trazodone to Restoril.   4. Smoking. Nicotine patch is available.  5. Metabolic syndrome monitoring. Lipid panel and TSH are normal. HgbA1C 5.6. We restarted Metformin.  6. EKG. Normal sinus rhythm, QTc 427.  7. Social. The patient has a new legal guardian.   8. Agitation. Ativan and Thorazine are available and he has been receiving prn medications.  9. Urinary incontinence. We will start DDAVP.   10. Constipation. Will start bowel regimen.   11. Disposition. We made referral to [facility]. He will follow up with ACT team.      Orson Slick, MD 06/28/2016, 12:19 PM

## 2016-06-28 NOTE — Progress Notes (Signed)
D: Pt denies SI/HI/AVH, but noted talking to self earlier this shift. Patient affect is labile mood is bright, thoughts are less disorganized. Pt is pleasant and cooperative with treatment plan. Patient expressed the enema and magnesium  Citrate has helped and he had a bowel movement on our shift. Pt appears less anxious and he is interacting with peers and staff appropriately.  A: Pt was offered support and encouragement. Pt was given scheduled medications. Pt was encouraged to attend groups. Q 15 minute checks were done for safety.  R:Pt attends groups and interacts well with peers and staff. Pt is taking medication. Pt has no complaints.Pt receptive to treatment and safety maintained on unit.

## 2016-06-28 NOTE — Plan of Care (Signed)
Problem: Self-Care: Goal: Ability to participate in self-care as condition permits will improve Outcome: Not Progressing Remains disheveled, poor hygiene. Will not participate in self care.

## 2016-06-28 NOTE — Plan of Care (Signed)
Problem: Coping: Goal: Ability to verbalize feelings will improve Outcome: Progressing Patient was able to verbalize how he was hopefull he would be able to sell mother's house and live independently.

## 2016-06-28 NOTE — Progress Notes (Signed)
D: Pt denies SI/HI/AVH, but noted responding to internal stimuli.  Pt is pleasant and cooperative. Pt stated he feels better from taking magnesium citrate also enema and he had a bowel movement on this shift. Patient is interacting with peers and staff appropriately.  A: Pt was offered support and encouragement. Pt was given scheduled medications. Pt was encouraged to attend groups. Q 15 minute checks were done for safety.  R:Pt attends groups and interacts well with peers and staff. Pt is taking medication. Pt has no complaints.Pt receptive to treatment and safety maintained on unit.

## 2016-06-29 DIAGNOSIS — E785 Hyperlipidemia, unspecified: Secondary | ICD-10-CM

## 2016-06-29 DIAGNOSIS — K5903 Drug induced constipation: Secondary | ICD-10-CM

## 2016-06-29 NOTE — Progress Notes (Signed)
[facility] MD Progress Note  06/29/2016 2:07 PM Jeffrey [name]  MRN:  [id number]  Subjective:  Jeffrey [name] is a 56 year old male with schizophrenia admitted floridly psychotic in the context of treatment noncompliance following his mother's death. He is on Clozapine and Depakote improving very slowly with terrible hygiene. He has a new guardian. Awaiting transfer to [facility] or placement if improved.   06/24/2016. Jeffrey [name] is up this morning and quite agitated. He received a dose of Ativan last night. Spoke with me 5 times already demanding discharge, call sheriff Wynetta Emery and change his guardian to a "white women from psychiatric center on [address]". His hygiene is terrible. Last week he slept most of the time. Last night he slept 4 hours only and has been up all morning. He accepts medications and repports no side effects. Appetite is fair. No program participation.  06/25/2016.Jeffrey [name] is up and running today. He is asking for discharge with family to "his house". He seems not to understand that he will be placed in a group home. He has lived in a group home before. He refuses to see his guardian and demands a meeting with a "red hair women from mental health center". Apparently this is a member of his ACT team. Unfortunatly the patient has racist tendencies. He takes medications. Sleep and appetite are fair. [facility] is unlikely to take him as they have many conditions for admission, like stable living situation. We will request case manager from his Medicaid provider and a meeting with the ACT team and the guardian.  06/26/2016. Jeffrey [name] is more animated today. Unfortunately he "his placement in a group home as he believes that he could sell his mother's house and live independently. He also refuses to go to a group home with black people. He takes medications. No side effects are reported. He took a shower and his hands. However his pants smell of urine.  06/27/2016. Jeffrey [name] is constipated today. He did not  respond to Mag Citrate. Fleet enema today. He denies any symptoms of depression, anxiety or psychosis. He is still talking to himself but closer to baseline. He is still delusional and refuses placement. He seems not to understand that he has a guardian now, wants to "sell his mother's house". Accepts medications. Hygiene is improving but refuses to wash cloths.  06/28/2016.Jeffrey [name] much better today. He has showered and changed his clothes. This is mostly due to severe diarrhea after he was given laxatives. At last, he was able to recognize the need for hygiene. He met with prospective group home owner yesterday. It did go well except for the fact that he was soiled. He takes medications and has no complaints. He is secluded to his room and skips meals. He did not eat lunch today. His thinking continues to improve and he is now able to hold a brief almost sensible conversation.  06/29/2016. Jeffrey [name] feels ready for discharge and is requesting a meetin with his guardian, forgetting that it is not a woman. Accepts medications and tolerates them well. Better hygiene. No behavioral problems.   Per nursing: Problem: Coping: Goal: Ability to cope will improve Outcome: Progressing Patient has spent most of this evening with peer group although he is observed having only minimal contact with others.  He initiated asking staff to take the patients outside "one more time" and he was seen as assertive.  He stayed in the dayroom until 2355 to watch the basketball game and was observed to be  attentive.  He denied feeling constipated tonight.  He is without aggressive or bizarre behavior thus far tonight.  Principal Problem: Schizophrenia, undifferentiated ([place]) Diagnosis:   Patient Active Problem List   Diagnosis Date Noted  . Dyslipidemia [E78.5] 05/27/2016  . Constipation [K59.00] 05/27/2016  . Schizophrenia, undifferentiated ([place]) [F20.3] 05/27/2016  . Noncompliance [Z91.19] 02/15/2016  .  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 Schizophrenia ([place]) [F20.9] 11/08/2014  . Tobacco use disorder [F17.200] 06/30/2014  . 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 Hypertension [I10] 06/29/2014  . Hyponatremia [E87.1] 06/29/2014  . Laceration of neck [S11.91XA] 06/29/2014  . Suicidal behavior [R46.89] 06/29/2014   Total Time spent with patient: 20 minutes  Past Psychiatric History: schizophrenia.  Past Medical History:  Past Medical History:  Diagnosis Date  . Hypertension   . Schizophrenia [facility])     Past Surgical History:  Procedure Laterality Date  . 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 No pertinent family history. Family Psychiatric  History: none reported. Social History:  History  Alcohol Use No     History  Drug Use No    Social History   Social History  . Marital status: Single    Spouse name: N/A  . Number of children: N/A  . Years of education: N/A   Social History Main Topics  . Smoking status: Current Every Day Smoker    Packs/day: 1.00    Types: Cigarettes  . Smokeless tobacco: Current User  . Alcohol use No  . Drug use: No  . Sexual activity: Not Asked   Other Topics Concern  . None   Social History Narrative  . None   Additional Social History:    History of alcohol / drug use?: No history of alcohol / drug abuse      Current Medications: Current Facility-Administered Medications  Medication Dose Route Frequency Provider Last Rate Last Dose  . acetaminophen (TYLENOL) tablet 650 mg  650 mg Oral Q6H PRN Clapacs, John T, MD      . alum & mag hydroxide-simeth (MAALOX/MYLANTA) 200-200-20 MG/5ML suspension 30 mL  30 mL Oral Q4H PRN Clapacs, John T, MD      . chlorproMAZINE (THORAZINE) tablet 50 mg  50 mg Oral TID PRN Yeng Frankie B, MD   50 mg at 06/27/16 2306  . cloNIDine (CATAPRES) tablet 0.1 mg  0.1 mg Oral BID PRN Chauncey Mann, MD      . cloZAPine  (CLOZARIL) tablet 100 mg  100 mg Oral Daily Clapacs, Madie Reno, MD   100 mg at 06/29/16 0904  . cloZAPine (CLOZARIL) tablet 400 mg  400 mg Oral QHS Clapacs, Madie Reno, MD   400 mg at 06/28/16 2114  . desmopressin (DDAVP) tablet 0.2 mg  0.2 mg Oral QHS Nickoles Gregori B, MD   0.2 mg at 06/28/16 2114  . divalproex (DEPAKOTE) DR tablet 1,000 mg  1,000 mg Oral Q12H Markail Diekman B, MD   1,000 mg at 06/29/16 0903  . docusate sodium (COLACE) capsule 200 mg  200 mg Oral BID Tanna Loeffler B, MD   200 mg at 06/29/16 0904  . LORazepam (ATIVAN) tablet 2 mg  2 mg Oral Q4H PRN Dusan Lipford B, MD   2 mg at 06/24/16 0043  . magnesium hydroxide (MILK OF MAGNESIA) suspension 30 mL  30 mL Oral Daily PRN Clapacs, Madie Reno, MD   30 mL at 06/25/16 2209  .  metFORMIN (GLUCOPHAGE) tablet 500 mg  500 mg Oral BID WC Ismael Karge B, MD   500 mg at 06/29/16 0904  . polyethylene glycol (MIRALAX / GLYCOLAX) packet 17 g  17 g Oral Daily Classie Weng B, MD   17 g at 06/27/16 1156  . senna (SENOKOT) tablet 17.2 mg  2 tablet Oral QHS Clapacs, John T, MD   17.2 mg at 06/28/16 2114  . simvastatin (ZOCOR) tablet 40 mg  40 mg Oral QHS Clapacs, Madie Reno, MD   40 mg at 06/28/16 2115  . sodium phosphate (FLEET) 7-19 GM/118ML enema 1 enema  1 enema Rectal Q12H PRN Clapacs, Madie Reno, MD   1 enema at 06/27/16 1732  . temazepam (RESTORIL) capsule 30 mg  30 mg Oral QHS Aaryanna Hyden B, MD   30 mg at 06/28/16 2350  . tuberculin injection 5 Units  5 Units Intradermal Once Kresha Abelson B, MD        Lab Results:  No results found for this or any previous visit (from the past 48 hour(s)).  Blood Alcohol level:  Lab Results  Component Value Date   ETH <5 05/26/2016   ETH <5 09/03/4816    Metabolic Disorder Labs: Lab Results  Component Value Date   HGBA1C 5.6 05/28/2016   MPG 114 05/28/2016   No results found for: PROLACTIN Lab Results  Component Value Date   CHOL 139 05/28/2016   TRIG 84  05/28/2016   HDL 43 05/28/2016   CHOLHDL 3.2 05/28/2016   VLDL 17 05/28/2016   LDLCALC 79 05/28/2016   LDLCALC 74 11/08/2014    Physical Findings: AIMS: Facial and Oral Movements Muscles of Facial Expression: None, normal Lips and Perioral Area: None, normal Jaw: None, normal Tongue: None, normal,Extremity Movements Upper (arms, wrists, hands, fingers): Minimal Lower (legs, knees, ankles, toes): Minimal, Trunk Movements Neck, shoulders, hips: None, normal, Overall Severity Severity of abnormal movements (highest score from questions above): Minimal Incapacitation due to abnormal movements: None, normal Patient's awareness of abnormal movements (rate only patient's report): No Awareness, Dental Status Current problems with teeth and/or dentures?: Yes (missing teeth ) Does patient usually wear dentures?: No  CIWA:    COWS:     Musculoskeletal: Strength & Muscle Tone: within normal limits Gait & Station: normal Patient leans: N/A  Psychiatric Specialty Exam: Physical Exam  Nursing note and vitals reviewed. Musculoskeletal: He exhibits deformity.  Psychiatric: His affect is labile. His speech is slurred. He is hyperactive and actively hallucinating. Thought content is paranoid and delusional. Cognition and memory are impaired. He expresses impulsivity.    Review of Systems  Unable to perform ROS: Acuity of condition  Constitutional:       Left upper extremity has been amputated. VEry poor hygeine.  Psychiatric/Behavioral: Positive for hallucinations.  All other systems reviewed and are negative.   Blood pressure 130/80, pulse 82, temperature 98.1 F (36.7 C), temperature source Oral, resp. rate 18, height 5' 8"  (1.727 m), weight 80.3 kg (177 lb), SpO2 99 %.Body mass index is 26.91 kg/m.  General Appearance: Fairly Groomed  Eye Contact:  Good  Speech:  Garbled and Pressured  Volume:  Normal  Mood:  Irritable  Affect:  Inappropriate and Labile  Thought Process:  NA   Orientation:  Full (Time, Place, and Person)  Thought Content:  NA, Delusions, Hallucinations: Auditory and Paranoid Ideation  Suicidal Thoughts:  No  Homicidal Thoughts:  No  Memory:  Immediate;   Poor Recent;   Poor Remote;  Poor  Judgement:  Impaired  Insight:  Lacking  Psychomotor Activity:  Increased  Concentration:  Concentration: Poor and Attention Span: Poor  Recall:  Poor  Fund of Knowledge:  Poor  Language:  Poor  Akathisia:  No  Handed:  Right  AIMS (if indicated):     Assets:  Communication Skills Desire for Improvement Financial Resources/Insurance Physical Health Resilience  ADL's:  Intact  Cognition:  WNL  Sleep:  Number of Hours: 5.45     Treatment Plan Summary: Daily contact with patient to assess and evaluate symptoms and progress in treatment and Medication management   Jeffrey [name] is a 56 year old male with a history of schizophrenia admitted for psychotic break in the context of medication noncompliance and severe social stressors.  1. Psychosis. He was  restarted Clozapine 100 mg in the morning 400 mg at night for psychosis and Depakote for mood stabilization.   2. Dyslipidemia. He is on Lipitor.  3. Insomnia. Will switch from Trazodone to Restoril.   4. Smoking. Nicotine patch is available.  5. Metabolic syndrome monitoring. Lipid panel and TSH are normal. HgbA1C 5.6. We restarted Metformin.  6. EKG. Normal sinus rhythm, QTc 427.  7. Social. The patient has a new legal guardian.   8. Agitation. Ativan and Thorazine are available and he has been receiving prn medications.  9. Urinary incontinence. We will start DDAVP.   10. Constipation. Will start bowel regimen.   11. Disposition. We made referral to [facility]. He will follow up with ACT team.      Orson Slick, MD 06/29/2016, 2:07 PM

## 2016-06-29 NOTE — [facility] Group Notes (Signed)
[facility] Group Notes:  (Nursing/MHT/Case Management/Adjunct)  Date:  06/29/2016  Time:  2:27 AM  Type of Therapy:  Evening Wrap-up Group  Participation Level:  Active  Participation Quality:  Appropriate and Attentive  Affect:  Appropriate  Cognitive:  Alert and Appropriate  Insight:  Appropriate  Engagement in Group:  Engaged  Modes of Intervention:  Activity and Discussion  Summary of Progress/Problems:  Jeffrey MorrowChelsea Nanta [name] 06/29/2016, 2:27 AM

## 2016-06-29 NOTE — Progress Notes (Signed)
[facility] LCSW Group Therapy   06/29/2016  1 PM  Type of Therapy: Group Therapy   Participation Level: Did Not Attend. Patient invited to participate but declined.    Hong Moring F. Keyah Blizard, MSW, LCSWA, LCAS     

## 2016-06-29 NOTE — Plan of Care (Signed)
Problem: Coping: Goal: Ability to cope will improve Outcome: Not Progressing Remains irritable and impulsive

## 2016-06-29 NOTE — Progress Notes (Signed)
Pt. Remains isolative to room for majority of day.  Labile.  Irritable, but compliant with morning meds.  Demands to speak with case worker, although case worker is not present on unit.  Limited interaction observed with staff and/or peers.  Continues to refuse PPD, "I'm not getting no shots.  Give me the pills."  Attempted to educate, pt refused.  Med compliant.  Refused to participate in assessment.  Will continue to monitor.

## 2016-06-29 NOTE — Plan of Care (Signed)
Problem: Coping: Goal: Ability to cope will improve Outcome: Progressing Patient has spent most of this evening with peer group although he is observed having only minimal contact with others.  He initiated asking staff to take the patients outside "one more time" and he was seen as assertive.  He stayed in the dayroom until 2355 to watch the basketball game and was observed to be attentive.  He denied feeling constipated tonight.  He is without aggressive or bizarre behavior thus far tonight.

## 2016-06-30 NOTE — Progress Notes (Signed)
Pt remains isolative to room for majority of shift.  Dismissive, irritable.  Med compliant.  Reports he wants to speak with his case worker and/or guardian but refuses to speak with social worker about this and states, "YOU talk to them!"  And lays back down.  No participation in treatment or milieu.  Later in shift, reports to nurse that he wants to speak to guardian and instructs this RN to arrange that visit.  RN reported to pt that his request would be documented but instructed him to discuss this with his Child psychotherapistsocial worker and treatment team tomorrow.  No other behavioral issues to report.

## 2016-06-30 NOTE — Progress Notes (Signed)
[name] MD Progress Note  06/30/2016 7:32 AM [name]  MRN:  [id number]  Subjective:  [name] is a 56 year old male with schizophrenia admitted floridly psychotic in the context of treatment noncompliance following his mother's death. He is on Clozapine and Depakote improving very slowly with terrible hygiene. He has a new guardian. Awaiting transfer to [facility] or placement if improved.   06/24/2016. Jeffrey [name] is up this morning and quite agitated. He received a dose of Ativan last night. Spoke with me 5 times already demanding discharge, call sheriff Wynetta Emery and change his guardian to a "white women from psychiatric center on [address]". His hygiene is terrible. Last week he slept most of the time. Last night he slept 4 hours only and has been up all morning. He accepts medications and repports no side effects. Appetite is fair. No program participation.  06/25/2016.Jeffrey [name] is up and running today. He is asking for discharge with family to "his house". He seems not to understand that he will be placed in a group home. He has lived in a group home before. He refuses to see his guardian and demands a meeting with a "red hair women from mental health center". Apparently this is a member of his ACT team. Unfortunatly the patient has racist tendencies. He takes medications. Sleep and appetite are fair. [facility] is unlikely to take him as they have many conditions for admission, like stable living situation. We will request case manager from his Medicaid provider and a meeting with the ACT team and the guardian.  06/26/2016. Jeffrey [name] is more animated today. Unfortunately he "his placement in a group home as he believes that he could sell his mother's house and live independently. He also refuses to go to a group home with black people. He takes medications. No side effects are reported. He took a shower and his hands. However his pants smell of urine.  06/27/2016. Jeffrey [name] is constipated today. He did not  respond to Mag Citrate. Fleet enema today. He denies any symptoms of depression, anxiety or psychosis. He is still talking to himself but closer to baseline. He is still delusional and refuses placement. He seems not to understand that he has a guardian now, wants to "sell his mother's house". Accepts medications. Hygiene is improving but refuses to wash cloths.  06/28/2016.Jeffrey [name] propes much better today. He has showered and changed his clothes. This is mostly due to severe diarrhea after he was given laxatives. At last, he was able to recognize the need for hygiene. He met with prospective group home owner yesterday. It did go well except for the fact that he was soiled. He takes medications and has no complaints. He is secluded to his room and skips meals. He did not eat lunch today. His thinking continues to improve and he is now able to hold a brief almost sensible conversation.  06/29/2016. Jeffrey [name] feels ready for discharge and is requesting a meetin with his guardian, forgetting that it is not a woman. Accepts medications and tolerates them well. Better hygiene. No behavioral problems.   06/30/2016. Mr. Perrell is very anxious today believing that I am going to keep him in the hospital forever. He now wants to be dcharged to a group home as soon as possible. He seems at his baseline. We will contact Consepcion Hearing, group home owner, and his guardian to coordinate. He takes medications and tolerates them well. Much better hygiene.  Per nursing: Problem: Coping: Goal: Ability to  cope will improve Outcome: Progressing Patient is observed initiating requests to staff.  He enjoyed being outside in the courtyard.  He continues to isolate himself from peers but appears calm when with others. He watched sports on television until 1130.  He is expressing fear about the future and is asking that writer help him get to a group home.  Principal Problem: Schizophrenia, undifferentiated (Jeffrey [name]) Diagnosis:    Patient Active Problem List   Diagnosis Date Noted  . Dyslipidemia [E78.5] 05/27/2016  . Constipation [K59.00] 05/27/2016  . Schizophrenia, undifferentiated ([name]) [F20.3] 05/27/2016  . Noncompliance [Z91.19] 02/15/2016  . 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 Schizophrenia ([name]) [F20.9] 11/08/2014  . Tobacco use disorder [F17.200] 06/30/2014  . 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 Hypertension [I10] 06/29/2014  . Hyponatremia [E87.1] 06/29/2014  . Laceration of neck [S11.91XA] 06/29/2014  . Suicidal behavior [R46.89] 06/29/2014   Total Time spent with patient: 20 minutes  Past Psychiatric History: schizophrenia.  Past Medical History:  Past Medical History:  Diagnosis Date  . Hypertension   . Schizophrenia [name])     Past Surgical History:  Procedure Laterality Date  . 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 No pertinent family history. Family Psychiatric  History: none reported. Social History:  History  Alcohol Use No     History  Drug Use No    Social History   Social History  . Marital status: Single    Spouse name: N/A  . Number of children: N/A  . Years of education: N/A   Social History Main Topics  . Smoking status: Current Every Day Smoker    Packs/day: 1.00    Types: Cigarettes  . Smokeless tobacco: Current User  . Alcohol use No  . Drug use: No  . Sexual activity: Not Asked   Other Topics Concern  . None   Social History Narrative  . None   Additional Social History:    History of alcohol / drug use?: No history of alcohol / drug abuse      Current Medications: Current Facility-Administered Medications  Medication Dose Route Frequency Provider Last Rate Last Dose  . acetaminophen (TYLENOL) tablet 650 mg  650 mg Oral Q6H PRN Clapacs, John T, MD      . alum & mag hydroxide-simeth (MAALOX/MYLANTA) 200-200-20 MG/5ML suspension 30 mL  30 mL Oral Q4H PRN  Clapacs, John T, MD      . chlorproMAZINE (THORAZINE) tablet 50 mg  50 mg Oral TID PRN Jazae Gandolfi B, MD   50 mg at 06/27/16 2306  . cloNIDine (CATAPRES) tablet 0.1 mg  0.1 mg Oral BID PRN Chauncey Mann, MD      . cloZAPine (CLOZARIL) tablet 100 mg  100 mg Oral Daily Clapacs, Madie Reno, MD   100 mg at 06/29/16 0904  . cloZAPine (CLOZARIL) tablet 400 mg  400 mg Oral QHS Clapacs, Madie Reno, MD   400 mg at 06/29/16 2145  . desmopressin (DDAVP) tablet 0.2 mg  0.2 mg Oral QHS Avion Kutzer B, MD   0.2 mg at 06/29/16 2146  . divalproex (DEPAKOTE) DR tablet 1,000 mg  1,000 mg Oral Q12H Corayma Cashatt B, MD   1,000 mg at 06/29/16 2145  . docusate sodium (COLACE) capsule 200 mg  200 mg Oral BID Jams Trickett B, MD   200 mg at 06/29/16 1650  . LORazepam (ATIVAN) tablet 2 mg  2 mg  Oral Q4H PRN Chrystle Murillo B, MD   2 mg at 06/24/16 0043  . magnesium hydroxide (MILK OF MAGNESIA) suspension 30 mL  30 mL Oral Daily PRN Clapacs, Madie Reno, MD   30 mL at 06/25/16 2209  . metFORMIN (GLUCOPHAGE) tablet 500 mg  500 mg Oral BID WC Emer Onnen B, MD   500 mg at 06/29/16 1650  . polyethylene glycol (MIRALAX / GLYCOLAX) packet 17 g  17 g Oral Daily Kainon Varady B, MD   17 g at 06/27/16 1156  . senna (SENOKOT) tablet 17.2 mg  2 tablet Oral QHS Clapacs, John T, MD   17.2 mg at 06/29/16 2145  . simvastatin (ZOCOR) tablet 40 mg  40 mg Oral QHS Clapacs, Madie Reno, MD   40 mg at 06/29/16 2146  . sodium phosphate (FLEET) 7-19 GM/118ML enema 1 enema  1 enema Rectal Q12H PRN Clapacs, Madie Reno, MD   1 enema at 06/27/16 1732  . temazepam (RESTORIL) capsule 30 mg  30 mg Oral QHS Virgil Slinger B, MD   30 mg at 06/29/16 2232  . tuberculin injection 5 Units  5 Units Intradermal Once Alric Geise B, MD        Lab Results:  No results found for this or any previous visit (from the past 48 hour(s)).  Blood Alcohol level:  Lab Results  Component Value Date   ETH <5 05/26/2016   ETH <5  26/33/3545    Metabolic Disorder Labs: Lab Results  Component Value Date   HGBA1C 5.6 05/28/2016   MPG 114 05/28/2016   No results found for: PROLACTIN Lab Results  Component Value Date   CHOL 139 05/28/2016   TRIG 84 05/28/2016   HDL 43 05/28/2016   CHOLHDL 3.2 05/28/2016   VLDL 17 05/28/2016   LDLCALC 79 05/28/2016   LDLCALC 74 11/08/2014    Physical Findings: AIMS: Facial and Oral Movements Muscles of Facial Expression: None, normal Lips and Perioral Area: None, normal Jaw: None, normal Tongue: None, normal,Extremity Movements Upper (arms, wrists, hands, fingers): Minimal Lower (legs, knees, ankles, toes): Minimal, Trunk Movements Neck, shoulders, hips: None, normal, Overall Severity Severity of abnormal movements (highest score from questions above): Minimal Incapacitation due to abnormal movements: None, normal Patient's awareness of abnormal movements (rate only patient's report): No Awareness, Dental Status Current problems with teeth and/or dentures?: Yes (missing teeth ) Does patient usually wear dentures?: No  CIWA:    COWS:     Musculoskeletal: Strength & Muscle Tone: within normal limits Gait & Station: normal Patient leans: N/A  Psychiatric Specialty Exam: Physical Exam  Nursing note and vitals reviewed. Musculoskeletal: He exhibits deformity.  Psychiatric: His affect is labile. His speech is slurred. He is hyperactive and actively hallucinating. Thought content is paranoid and delusional. Cognition and memory are impaired. He expresses impulsivity.    Review of Systems  Unable to perform ROS: Acuity of condition  Constitutional:       Left upper extremity has been amputated. VEry poor hygeine.  Psychiatric/Behavioral: Positive for hallucinations.  All other systems reviewed and are negative.   Blood pressure 103/70, pulse 84, temperature 97.8 F (36.6 C), temperature source Oral, resp. rate 18, height '5\' 8"'$  (1.727 m), weight 80.3 kg (177 lb),  SpO2 99 %.Body mass index is 26.91 kg/m.  General Appearance: Fairly Groomed  Eye Contact:  Good  Speech:  Garbled and Pressured  Volume:  Normal  Mood:  Irritable  Affect:  Inappropriate and Labile  Thought Process:  NA  Orientation:  Full (Time, Place, and Person)  Thought Content:  NA, Delusions, Hallucinations: Auditory and Paranoid Ideation  Suicidal Thoughts:  No  Homicidal Thoughts:  No  Memory:  Immediate;   Poor Recent;   Poor Remote;   Poor  Judgement:  Impaired  Insight:  Lacking  Psychomotor Activity:  Increased  Concentration:  Concentration: Poor and Attention Span: Poor  Recall:  Poor  Fund of Knowledge:  Poor  Language:  Poor  Akathisia:  No  Handed:  Right  AIMS (if indicated):     Assets:  Communication Skills Desire for Improvement Financial Resources/Insurance Physical Health Resilience  ADL's:  Intact  Cognition:  WNL  Sleep:  Number of Hours: 7     Treatment Plan Summary: Daily contact with patient to assess and evaluate symptoms and progress in treatment and Medication management   [name] is a 56 year old male with a history of schizophrenia admitted for psychotic break in the context of medication noncompliance and severe social stressors.  1. Psychosis. He was  restarted Clozapine 100 mg in the morning 400 mg at night for psychosis and Depakote for mood stabilization.   2. Dyslipidemia. He is on Lipitor.  3. Insomnia. Will switch from Trazodone to Restoril.   4. Smoking. Nicotine patch is available.  5. Metabolic syndrome monitoring. Lipid panel and TSH are normal. HgbA1C 5.6. We restarted Metformin.  6. EKG. Normal sinus rhythm, QTc 427.  7. Social. The patient has a new legal guardian.   8. Agitation. Ativan and Thorazine are available and he has been receiving prn medications.  9. Urinary incontinence. We will start DDAVP.   10. Constipation. Will start bowel regimen.   11. Disposition. We made referral to [facility]. He  will follow up with ACT team.      Orson Slick, MD 06/30/2016, 7:32 AM

## 2016-06-30 NOTE — Plan of Care (Signed)
Problem: Health Behavior/Discharge Planning: Goal: Ability to manage health-related needs will improve Outcome: Not Progressing Will not participate in care nor be proactive in communication with treatment team.

## 2016-06-30 NOTE — Plan of Care (Signed)
Problem: Coping: Goal: Ability to cope will improve Outcome: Progressing Patient is observed initiating requests to staff.  He enjoyed being outside in the courtyard.  He continues to isolate himself from peers but appears calm when with others. He watched sports on television until 1130.  He is expressing fear about the future and is asking that writer help him get to a group home.

## 2016-06-30 NOTE — [facility] Group Notes (Signed)
[facility] Group Notes:  (Nursing/MHT/Case Management/Adjunct)  Date:  06/30/2016  Time:  11:28 PM  Type of Therapy:  Psychoeducational Skills  Participation Level:  Minimal  Participation Quality:  Appropriate  Affect:  Not Congruent  Cognitive:  Disorganized and Lacking  Insight:  Lacking and Limited  Engagement in Group:  Distracting and Limited  Modes of Intervention:  Discussion  Summary of Progress/Problems:  Foy GuadalajaraJasmine R Cornelious Bartolucci 06/30/2016, 11:28 PM

## 2016-06-30 NOTE — [facility] Group Notes (Signed)
[facility] Group Notes: (Clinical Social Work)   06/30/2016      Type of Therapy:  Group Therapy   Participation Level:  Did Not Attend despite invitation   Ambrose MantleMareida Grossman-Orr, LCSW 06/30/2016, 3:07 PM

## 2016-06-30 NOTE — Social Work (Signed)
Clinical Social Work Note  Pt knocked on office door numerous times, very upset stating "Ya'll can't keep me here forever."  He would like the number for his guardian/advocate but was easily assuaged that this person would be able to be contacted tomorrow.  Ambrose MantleMareida Grossman-Orr, LCSW 06/30/2016, 3:07 PM

## 2016-07-01 NOTE — BH Assessment (Signed)
Writer spoke with [facility] ([phone]) patient remains on the Wait List. 

## 2016-07-01 NOTE — [facility] Group Notes (Signed)
[facility]  Date/Time: 07/01/2016, 1pm  Type of Therapy and Topic:  Group Therapy:  Overcoming Obstacles  Participation Level:  DID NOT ATTEND   Jeffrey [name] 07/01/2016, 2:49 PM

## 2016-07-01 NOTE — Plan of Care (Signed)
Problem: Coping: Goal: Ability to cope will improve Outcome: Progressing Patient was cooperative in the early part of this shift and initiated request "to take Jeffrey [name] outside, please" at change of shift.  Jeffrey [name] watched baseball game in the dayroom with peers until 2300.  He took his Restoril after dayroom was closed and had difficulty falling asleep.  He paced in the hallway and made frequent stops at the Nurse's office to ask questions about discharge, placement in a group home, and concerns about how he will care for himself (financial issues).  He requested Ativan 2 mg at 1150. He also expressed concerns about not knowing where his ID and wallet are located and "how will I access money in the bank".  Most of the content indicated he was ruminating and fearful.  He was allowed to sit in the dayroom without television and was given a snack along with reassurance/support.  He returned to his room at 0020 and was observed sleeping by 0045.

## 2016-07-01 NOTE — Progress Notes (Signed)
Denies SI/HI/AVH.  Preoccupied with discharge.  Requesting to talk with his guardian.  Support offered.  Safety maintained.

## 2016-07-01 NOTE — Progress Notes (Signed)
[facility] MD Progress Note  07/01/2016 12:55 PM Jeffrey [name]  MRN:  [id number]  Subjective:  [name] is a 56 year old male with schizophrenia admitted floridly psychotic in the context of treatment noncompliance following his mother's death. He is on Clozapine and Depakote improving very slowly with terrible hygiene. He has a new guardian. Awaiting transfer to [facility] or placement if improved.   06/24/2016. [name] is up this morning and quite agitated. He received a dose of Ativan last night. Spoke with me 5 times already demanding discharge, call sheriff Wynetta Emery and change his guardian to a "white women from psychiatric center on [address]". His hygiene is terrible. Last week he slept most of the time. Last night he slept 4 hours only and has been up all morning. He accepts medications and repports no side effects. Appetite is fair. No program participation.  06/25/2016.[name] is up and running today. He is asking for discharge with family to "his house". He seems not to understand that he will be placed in a group home. He has lived in a group home before. He refuses to see his guardian and demands a meeting with a "red hair women from mental health center". Apparently this is a member of his ACT team. Unfortunatly the patient has racist tendencies. He takes medications. Sleep and appetite are fair. [facility] is unlikely to take him as they have many conditions for admission, like stable living situation. We will request case manager from his Medicaid provider and a meeting with the ACT team and the guardian.  06/26/2016. [name] is more animated today. Unfortunately he "his placement in a group home as he believes that he could sell his mother's house and live independently. He also refuses to go to a group home with black people. He takes medications. No side effects are reported. He took a shower and his hands. However his pants smell of urine.  06/27/2016. [name] is constipated today. He did not  respond to Mag Citrate. Fleet enema today. He denies any symptoms of depression, anxiety or psychosis. He is still talking to himself but closer to baseline. He is still delusional and refuses placement. He seems not to understand that he has a guardian now, wants to "sell his mother's house". Accepts medications. Hygiene is improving but refuses to wash cloths.  06/28/2016.[name] much better today. He has showered and changed his clothes. This is mostly due to severe diarrhea after he was given laxatives. At last, he was able to recognize the need for hygiene. He met with prospective group home owner yesterday. It did go well except for the fact that he was soiled. He takes medications and has no complaints. He is secluded to his room and skips meals. He did not eat lunch today. His thinking continues to improve and he is now able to hold a brief almost sensible conversation.  06/29/2016. [name] feels ready for discharge and is requesting a meetin with his guardian, forgetting that it is not a woman. Accepts medications and tolerates them well. Better hygiene. No behavioral problems.   06/30/2016. [name] is very anxious today believing that I am going to keep him in the hospital forever. He now wants to be dcharged to a group home as soon as possible. He seems at his baseline. We will contact Consepcion Hearing, group home owner, and his guardian to coordinate. He takes medications and tolerates them well. Much better hygiene.  6/11 patient continues to have very poor  hygiene and grooming, with a strong body odor. Patient was in his bed all covered with the sheets. He did not speak to me or uncover his head. He mumbled a few things but I was unable to understand him.  Per nurse's he is slept about 5 hours last night. There are no vital signs from today as he has not allowed the nurse to check his vitals. His blood pressure and heart rate from yesterday were within the normal limits. His oral intake is  good. He has compliant with all medications   Per nursing: Patient was cooperative in the early part of this shift and initiated request "to take [name] outside, please" at change of shift.  Jeffrey [name] watched baseball game in the dayroom with peers until 2300.  He took his Restoril after dayroom was closed and had difficulty falling asleep.  He paced in the hallway and made frequent stops at the Nurse's office to ask questions about discharge, placement in a group home, and concerns about how he will care for himself (financial issues).  He requested Ativan 2 mg at 1150. He also expressed concerns about not knowing where his ID and wallet are located and "how will I access money in the bank".  Most of the content indicated he was ruminating and fearful.  He was allowed to sit in the dayroom without television and was given a snack along with reassurance/support.  He returned to his room at [facility] and was observed sleeping by [facility].  Principal Problem: Schizophrenia, undifferentiated ([place]) Diagnosis:   Patient Active Problem List   Diagnosis Date Noted  . Dyslipidemia [E78.5] 05/27/2016  . Constipation [K59.00] 05/27/2016  . Schizophrenia, undifferentiated ([place]) [F20.3] 05/27/2016  . Noncompliance [Z91.19] 02/15/2016  . Tobacco use disorder [F17.200] 06/30/2014  . Hypertension [I10] 06/29/2014   Total Time spent with patient: 20 minutes  Past Psychiatric History: schizophrenia.  Past Medical History:  Past Medical History:  Diagnosis Date  . Hypertension   . Schizophrenia [facility])     Past Surgical History:  Procedure Laterality Date  . 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 No pertinent family history. Family Psychiatric  History: none reported. Social History:  History  Alcohol Use No     History  Drug Use No    Social History   Social History  . Marital status: Single    Spouse name: N/A  . Number of children: N/A  .  Years of education: N/A   Social History Main Topics  . Smoking status: Current Every Day Smoker    Packs/day: 1.00    Types: Cigarettes  . Smokeless tobacco: Current User  . Alcohol use No  . Drug use: No  . Sexual activity: Not Asked   Other Topics Concern  . None   Social History Narrative  . None   Additional Social History:    History of alcohol / drug use?: No history of alcohol / drug abuse      Current Medications: Current Facility-Administered Medications  Medication Dose Route Frequency Provider Last Rate Last Dose  . acetaminophen (TYLENOL) tablet 650 mg  650 mg Oral Q6H PRN Clapacs, John T, MD      . alum & mag hydroxide-simeth (MAALOX/MYLANTA) 200-200-20 MG/5ML suspension 30 mL  30 mL Oral Q4H PRN Clapacs, John T, MD      . chlorproMAZINE (THORAZINE) tablet 50 mg  50 mg Oral TID PRN Pucilowska, [name],  MD   50 mg at 06/27/16 2306  . cloNIDine (CATAPRES) tablet 0.1 mg  0.1 mg Oral BID PRN Chauncey Mann, MD      . cloZAPine (CLOZARIL) tablet 100 mg  100 mg Oral Daily Clapacs, Madie Reno, MD   100 mg at 07/01/16 0910  . cloZAPine (CLOZARIL) tablet 400 mg  400 mg Oral QHS Clapacs, John T, MD   400 mg at 06/30/16 2147  . desmopressin (DDAVP) tablet 0.2 mg  0.2 mg Oral QHS Pucilowska, [name], MD   0.2 mg at 06/30/16 2147  . divalproex (DEPAKOTE) DR tablet 1,000 mg  1,000 mg Oral Q12H Pucilowska, [name], MD   1,000 mg at 07/01/16 0910  . docusate sodium (COLACE) capsule 200 mg  200 mg Oral BID Pucilowska, [name], MD   200 mg at 07/01/16 0910  . LORazepam (ATIVAN) tablet 2 mg  2 mg Oral Q4H PRN Pucilowska, [name], MD   2 mg at 06/30/16 2349  . magnesium hydroxide (MILK OF MAGNESIA) suspension 30 mL  30 mL Oral Daily PRN Clapacs, Madie Reno, MD   30 mL at 06/25/16 2209  . metFORMIN (GLUCOPHAGE) tablet 500 mg  500 mg Oral BID WC Pucilowska, [name], MD   500 mg at 07/01/16 0910  . polyethylene glycol (MIRALAX / GLYCOLAX) packet 17 g  17 g Oral Daily Pucilowska,  [name], MD   17 g at 06/27/16 1156  . senna (SENOKOT) tablet 17.2 mg  2 tablet Oral QHS Clapacs, John T, MD   17.2 mg at 06/30/16 2147  . simvastatin (ZOCOR) tablet 40 mg  40 mg Oral QHS Clapacs, Madie Reno, MD   40 mg at 06/30/16 2148  . sodium phosphate (FLEET) 7-19 GM/118ML enema 1 enema  1 enema Rectal Q12H PRN Clapacs, Madie Reno, MD   1 enema at 06/27/16 1732  . temazepam (RESTORIL) capsule 30 mg  30 mg Oral QHS Pucilowska, [name], MD   30 mg at 06/30/16 2247  . tuberculin injection 5 Units  5 Units Intradermal Once Pucilowska, [name], MD   5 Units at 06/30/16 2057    Lab Results:  No results found for this or any previous visit (from the past 48 hour(s)).  Blood Alcohol level:  Lab Results  Component Value Date   ETH <5 05/26/2016   ETH <5 67/59/1638    Metabolic Disorder Labs: Lab Results  Component Value Date   HGBA1C 5.6 05/28/2016   MPG 114 05/28/2016   No results found for: PROLACTIN Lab Results  Component Value Date   CHOL 139 05/28/2016   TRIG 84 05/28/2016   HDL 43 05/28/2016   CHOLHDL 3.2 05/28/2016   VLDL 17 05/28/2016   LDLCALC 79 05/28/2016   LDLCALC 74 11/08/2014    Physical Findings: AIMS: Facial and Oral Movements Muscles of Facial Expression: None, normal Lips and Perioral Area: None, normal Jaw: None, normal Tongue: None, normal,Extremity Movements Upper (arms, wrists, hands, fingers): Minimal Lower (legs, knees, ankles, toes): Minimal, Trunk Movements Neck, shoulders, hips: None, normal, Overall Severity Severity of abnormal movements (highest score from questions above): Minimal Incapacitation due to abnormal movements: None, normal Patient's awareness of abnormal movements (rate only patient's report): No Awareness, Dental Status Current problems with teeth and/or dentures?: Yes (missing teeth ) Does patient usually wear dentures?: No  CIWA:    COWS:     Musculoskeletal: Strength & Muscle Tone: within normal limits Gait & Station:  normal Patient leans: N/A  Psychiatric Specialty Exam: Physical Exam  Nursing note and vitals reviewed. Constitutional: He is oriented to person, place, and time. He appears well-developed and well-nourished.  HENT:  Head: Normocephalic and atraumatic.  Eyes: EOM are normal.  Respiratory: Effort normal.  Musculoskeletal: Normal range of motion.  Neurological: He is alert and oriented to person, place, and time.    Review of Systems  Unable to perform ROS: Acuity of condition  Constitutional:       Left upper extremity has been amputated. VEry poor hygeine.    Blood pressure 103/70, pulse 84, temperature 97.8 F (36.6 C), temperature source Oral, resp. rate 18, height _0  (1.727 m), weight 80.3 kg (177 lb), SpO2 99 %.Body mass index is 26.91 kg/m.  General Appearance: Fairly Groomed  Eye Contact:  Good  Speech:  Garbled and Pressured  Volume:  Normal  Mood:  Irritable  Affect:  Inappropriate and Labile  Thought Process:  NA  Orientation:  Full (Time, Place, and Person)  Thought Content:  NA, Delusions, Hallucinations: Auditory and Paranoid Ideation  Suicidal Thoughts:  No  Homicidal Thoughts:  No  Memory:  Immediate;   Poor Recent;   Poor Remote;   Poor  Judgement:  Impaired  Insight:  Lacking  Psychomotor Activity:  Increased  Concentration:  Concentration: Poor and Attention Span: Poor  Recall:  Poor  Fund of Knowledge:  Poor  Language:  Poor  Akathisia:  No  Handed:  Right  AIMS (if indicated):     Assets:  Communication Skills Desire for Improvement Financial Resources/Insurance Physical Health Resilience  ADL's:  Intact  Cognition:  WNL  Sleep:  Number of Hours: 5.3     Treatment Plan Summary: Daily contact with patient to assess and evaluate symptoms and progress in treatment and Medication management   Mr. Schweer is a 56 year old male with a history of schizophrenia admitted for psychotic break in the context of medication noncompliance and severe  social stressors.  Schizophrenia:  He was  restarted Clozapine 100 mg in the morning 400 mg at night for psychosis and Depakote for irritability, 1000 mg every 12 hours. His last Depakote level on May 31 was 92  Agitation patient has orders for Thorazine 50 mg 3 times a day as needed and Ativan 2 mg every 4 hours as needed  Insomnia continue Restoril 30 mg daily at bedtime  Enuresis: Patient has been is started on desmopressin  Prevention of metabolic syndrome: Continue metformin 500 mg twice a day  Dyslipidemia: Continue Zocor 40 mg by mouth daily  Constipation continue Senokot 2 tablets at bedtime, Colace 200 mg twice a day and MiraLAX daily  Smoking: Continue nicotine patch  Labs:Marland Kitchen Lipid panel and TSH are normal. HgbA1C 5.6. We restarted Metformin.    EKG. Normal sinus rhythm, QTc 427.  Social: The patient has a new legal guardian.   Disposition. We made referral to [facility]. He will follow up with ACT team.   PPD placed on 6/10  Referral has been made to [facility]. The patient is currently on their waiting list. In the meantime were also trying to prepare things for future placement in a group home   Hildred Priest, MD 07/01/2016, 12:55 PM

## 2016-07-02 MED ORDER — METOPROLOL TARTRATE 25 MG PO TABS
12.5000 mg | ORAL_TABLET | Freq: Two times a day (BID) | ORAL | Status: DC
  Administered 2016-07-02 – 2016-07-10 (×16): 12.5 mg via ORAL
  Filled 2016-07-02 (×8): qty 1
  Filled 2016-07-02: qty 2
  Filled 2016-07-02 (×7): qty 1

## 2016-07-02 MED ORDER — FLUOXETINE HCL 10 MG PO CAPS
10.0000 mg | ORAL_CAPSULE | Freq: Every day | ORAL | Status: DC
  Administered 2016-07-02 – 2016-07-10 (×9): 10 mg via ORAL
  Filled 2016-07-02 (×9): qty 1

## 2016-07-02 NOTE — Progress Notes (Signed)
[facility] MD Progress Note  07/02/2016 10:42 AM Jeffrey [name]  MRN:  [id number]  Subjective:  Jeffrey [name] is a 56 year old male with schizophrenia admitted floridly psychotic in the context of treatment noncompliance following his mother's death. He is on Clozapine and Depakote improving very slowly with terrible hygiene. He has a new guardian. Awaiting transfer to [facility] or placement if improved.   06/24/2016. Jeffrey [name] is up this morning and quite agitated. He received a dose of Ativan last night. Spoke with me 5 times already demanding discharge, call sheriff Wynetta Emery and change his guardian to a "white [facility] on [address]". His hygiene is terrible. Last week he slept most of the time. Last night he slept 4 hours only and has been up all morning. He accepts medications and repports no side effects. Appetite is fair. No program participation.  06/25/2016.Jeffrey [name] is up and running today. He is asking for discharge with family to "his house". He seems not to understand that he will be placed in a group home. He has lived in a group home before. He refuses to see his guardian and demands a meeting with a "[facility]". Apparently this is a member of his ACT team. Unfortunatly the patient has racist tendencies. He takes medications. Sleep and appetite are fair. [facility] is unlikely to take him as they have many conditions for admission, like stable living situation. We will request case manager from his Medicaid provider and a meeting with the ACT team and the guardian.  06/26/2016. Jeffrey [name] is more animated today. Unfortunately he "his placement in a group home as he believes that he could sell his mother's house and live independently. He also refuses to go to a group home with black people. He takes medications. No side effects are reported. He took a shower and his hands. However his pants smell of urine.  06/27/2016. Jeffrey [name] is constipated today. He did not  respond to Mag Citrate. Fleet enema today. He denies any symptoms of depression, anxiety or psychosis. He is still talking to himself but closer to baseline. He is still delusional and refuses placement. He seems not to understand that he has a guardian now, wants to "sell his mother's house". Accepts medications. Hygiene is improving but refuses to wash cloths.  06/28/2016.Jeffrey [name] much better today. He has showered and changed his clothes. This is mostly due to severe diarrhea after he was given laxatives. At last, he was able to recognize the need for hygiene. He met with prospective group home owner yesterday. It did go well except for the fact that he was soiled. He takes medications and has no complaints. He is secluded to his room and skips meals. He did not eat lunch today. His thinking continues to improve and he is now able to hold a brief almost sensible conversation.  06/29/2016. Jeffrey [name] feels ready for discharge and is requesting a meetin with his guardian, forgetting that it is not a woman. Accepts medications and tolerates them well. Better hygiene. No behavioral problems.   06/30/2016. Jeffrey [name] is very anxious today believing that I am going to keep him in the hospital forever. He now wants to be dcharged to a group home as soon as possible. He seems at his baseline. We will contact Consepcion Hearing, group home owner, and his guardian to coordinate. He takes medications and tolerates them well. Much better hygiene.  6/11 patient continues to have very poor  hygiene and grooming, with a strong body odor. Patient was in his bed all covered with the sheets. He did not speak to me or uncover his head. He mumbled a few things but I was unable to understand him.  Per nurse's he is slept about 5 hours last night. There are no vital signs from today as he has not allowed the nurse to check his vitals. His blood pressure and heart rate from yesterday were within the normal limits. His oral intake is  good. He has compliant with all medications  6/12 withdrawn to his room. Lying in bed, appears that hygiene has improved, not body odor was detected today. Continues to comply with medications. He denied having any issues or concerns. Denies side effects from medications or physical complaints He did not get out of bed and talk to me while he was covered with sheets. He denies having any  "bad thoughts". He says he was a doctor his guardian.   Per nursing: D:Pt denies SI/HI/AVH, but noted responding to internal stimuli. Patient's affect is bright, his mood is pleasant, thoughts are disorganized and speech is tangential. Pt appears less anxious and he is interacting with peers and staff appropriately.  A:Pt was offered support and encouragement. Pt was given scheduled medications. Pt was encouraged to attend groups. Q 15 minute checks were done for safety.  R:Pt attends groups and interacts well with peers and staff. Pt is taking medication. Pt has no complaints.Pt receptive to treatment and safety maintained on unit.  Principal Problem: Schizophrenia, undifferentiated ([place]) Diagnosis:   Patient Active Problem List   Diagnosis Date Noted  . Dyslipidemia [E78.5] 05/27/2016  . Constipation [K59.00] 05/27/2016  . Schizophrenia, undifferentiated ([place]) [F20.3] 05/27/2016  . Noncompliance [Z91.19] 02/15/2016  . Tobacco use disorder [F17.200] 06/30/2014  . Hypertension [I10] 06/29/2014   Total Time spent with patient: 20 minutes  Past Psychiatric History: schizophrenia.  Past Medical History:  Past Medical History:  Diagnosis Date  . Hypertension   . Schizophrenia [facility])     Past Surgical History:  Procedure Laterality Date  . 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 No pertinent family history. Family Psychiatric  History: none reported. Social History:  History  Alcohol Use No     History  Drug Use No    Social  History   Social History  . Marital status: Single    Spouse name: N/A  . Number of children: N/A  . Years of education: N/A   Social History Main Topics  . Smoking status: Current Every Day Smoker    Packs/day: 1.00    Types: Cigarettes  . Smokeless tobacco: Current User  . Alcohol use No  . Drug use: No  . Sexual activity: Not Asked   Other Topics Concern  . None   Social History Narrative  . None   Additional Social History:    History of alcohol / drug use?: No history of alcohol / drug abuse      Current Medications: Current Facility-Administered Medications  Medication Dose Route Frequency Provider Last Rate Last Dose  . acetaminophen (TYLENOL) tablet 650 mg  650 mg Oral Q6H PRN Clapacs, John T, MD      . alum & mag hydroxide-simeth (MAALOX/MYLANTA) 200-200-20 MG/5ML suspension 30 mL  30 mL Oral Q4H PRN Clapacs, John T, MD      . chlorproMAZINE (THORAZINE) tablet 50 mg  50 mg Oral TID  PRN Orson Slick B, MD   50 mg at 07/01/16 2158  . cloNIDine (CATAPRES) tablet 0.1 mg  0.1 mg Oral BID PRN Chauncey Mann, MD   0.1 mg at 07/01/16 2159  . cloZAPine (CLOZARIL) tablet 100 mg  100 mg Oral Daily Clapacs, Madie Reno, MD   100 mg at 07/02/16 0839  . cloZAPine (CLOZARIL) tablet 400 mg  400 mg Oral QHS Clapacs, Madie Reno, MD   400 mg at 07/01/16 2205  . desmopressin (DDAVP) tablet 0.2 mg  0.2 mg Oral QHS Pucilowska, [name], MD   0.2 mg at 07/01/16 2202  . divalproex (DEPAKOTE) DR tablet 1,000 mg  1,000 mg Oral Q12H Pucilowska, [name], MD   1,000 mg at 07/02/16 0839  . docusate sodium (COLACE) capsule 200 mg  200 mg Oral BID Pucilowska, [name], MD   200 mg at 07/02/16 0839  . LORazepam (ATIVAN) tablet 2 mg  2 mg Oral Q4H PRN Pucilowska, [name], MD   2 mg at 06/30/16 2349  . magnesium hydroxide (MILK OF MAGNESIA) suspension 30 mL  30 mL Oral Daily PRN Clapacs, Madie Reno, MD   30 mL at 06/25/16 2209  . metFORMIN (GLUCOPHAGE) tablet 500 mg  500 mg Oral BID WC Pucilowska,  [name], MD   500 mg at 07/02/16 0839  . polyethylene glycol (MIRALAX / GLYCOLAX) packet 17 g  17 g Oral Daily Pucilowska, [name], MD   17 g at 06/27/16 1156  . senna (SENOKOT) tablet 17.2 mg  2 tablet Oral QHS Clapacs, John T, MD   17.2 mg at 07/01/16 2201  . simvastatin (ZOCOR) tablet 40 mg  40 mg Oral QHS Clapacs, Madie Reno, MD   40 mg at 07/01/16 2201  . sodium phosphate (FLEET) 7-19 GM/118ML enema 1 enema  1 enema Rectal Q12H PRN Clapacs, Madie Reno, MD   1 enema at 06/27/16 1732  . temazepam (RESTORIL) capsule 30 mg  30 mg Oral QHS Pucilowska, [name], MD   30 mg at 07/01/16 2200  . tuberculin injection 5 Units  5 Units Intradermal Once Pucilowska, [name], MD   5 Units at 06/30/16 2057    Lab Results:  No results found for this or any previous visit (from the past 48 hour(s)).  Blood Alcohol level:  Lab Results  Component Value Date   ETH <5 05/26/2016   ETH <5 41/28/7867    Metabolic Disorder Labs: Lab Results  Component Value Date   HGBA1C 5.6 05/28/2016   MPG 114 05/28/2016   No results found for: PROLACTIN Lab Results  Component Value Date   CHOL 139 05/28/2016   TRIG 84 05/28/2016   HDL 43 05/28/2016   CHOLHDL 3.2 05/28/2016   VLDL 17 05/28/2016   LDLCALC 79 05/28/2016   LDLCALC 74 11/08/2014    Physical Findings: AIMS: Facial and Oral Movements Muscles of Facial Expression: None, normal Lips and Perioral Area: None, normal Jaw: None, normal Tongue: None, normal,Extremity Movements Upper (arms, wrists, hands, fingers): Minimal Lower (legs, knees, ankles, toes): Minimal, Trunk Movements Neck, shoulders, hips: None, normal, Overall Severity Severity of abnormal movements (highest score from questions above): Minimal Incapacitation due to abnormal movements: None, normal Patient's awareness of abnormal movements (rate only patient's report): No Awareness, Dental Status Current problems with teeth and/or dentures?: Yes (missing teeth ) Does patient usually  wear dentures?: No  CIWA:    COWS:     Musculoskeletal: Strength & Muscle Tone: within normal limits Gait &  Station: normal Patient leans: N/A  Psychiatric Specialty Exam: Physical Exam  Nursing note and vitals reviewed. Constitutional: He is oriented to person, place, and time. He appears well-developed and well-nourished.  HENT:  Head: Normocephalic and atraumatic.  Eyes: EOM are normal.  Respiratory: Effort normal.  Musculoskeletal: Normal range of motion.  Neurological: He is alert and oriented to person, place, and time.    Review of Systems  Unable to perform ROS: Acuity of condition  Constitutional:       Left upper extremity has been amputated. VEry poor hygeine.    Blood pressure (!) 170/124, pulse 84, temperature 97.8 F (36.6 C), temperature source Oral, resp. rate 18, height 5' 8"  (1.727 m), weight 80.3 kg (177 lb), SpO2 99 %.Body mass index is 26.91 kg/m.  General Appearance: Fairly Groomed  Eye Contact:  Good  Speech:  Garbled and Pressured  Volume:  Normal  Mood:  Irritable  Affect:  Inappropriate and Labile  Thought Process:  NA  Orientation:  Full (Time, Place, and Person)  Thought Content:  NA, Delusions, Hallucinations: Auditory and Paranoid Ideation  Suicidal Thoughts:  No  Homicidal Thoughts:  No  Memory:  Immediate;   Poor Recent;   Poor Remote;   Poor  Judgement:  Impaired  Insight:  Lacking  Psychomotor Activity:  Increased  Concentration:  Concentration: Poor and Attention Span: Poor  Recall:  Poor  Fund of Knowledge:  Poor  Language:  Poor  Akathisia:  No  Handed:  Right  AIMS (if indicated):     Assets:  Communication Skills Desire for Improvement Financial Resources/Insurance Physical Health Resilience  ADL's:  Intact  Cognition:  WNL  Sleep:  Number of Hours: 6.5     Treatment Plan Summary: Daily contact with patient to assess and evaluate symptoms and progress in treatment and Medication management   Mr. Umar is a  56 year old male with a history of schizophrenia admitted for psychotic break in the context of medication noncompliance and severe social stressors.  Schizophrenia:  continue Clozapine 100 mg in the morning 400 mg at night for psychosis and Depakote for irritability, 1000 mg every 12 hours. His last Depakote level on May 31 was 92. Patient is still uncooperative. It is hard to assess. I wonder if patient is suffering from depression as we have been told his mother passed away recently.  Consider starting an antidepressant such as Prozac. Started it today at 20 mg  Agitation patient has orders for Thorazine 50 mg 3 times a day as needed and Ativan 2 mg every 4 hours as needed  Insomnia continue Restoril 30 mg daily at bedtime  Enuresis: Patient has been is started on desmopressin  Prevention of metabolic syndrome: Continue metformin 500 mg twice a day  Dyslipidemia: Continue Zocor 40 mg by mouth daily  Constipation continue Senokot 2 tablets at bedtime, Colace 200 mg twice a day and MiraLAX daily  Smoking: Continue nicotine patch  Labs:Marland Kitchen Lipid panel and TSH are normal. HgbA1C 5.6. We restarted Metformin.   EKG. Normal sinus rhythm, QTc 427.  Social: The patient has a new legal guardian.   Disposition. We made referral to [facility]. He will follow up with ACT team.   PPD placed on 6/10  Referral has been made to [facility]. The patient is currently on their waiting list. In the meantime were also trying to prepare things for future placement in a group home.  2 group homes are coming to evaluate him this week.  Hildred Priest, MD 07/02/2016, 10:42 AM

## 2016-07-02 NOTE — [facility] Group Notes (Signed)
[facility] Group Notes:  (Nursing/MHT/Case Management/Adjunct)  Date:  07/02/2016  Time:  1:39 PM  Type of Therapy:  Psychoeducational Skills  Participation Level:  Did Not Attend  Lynelle SmokeCara Travis [facility] 07/02/2016, 1:39 PM

## 2016-07-02 NOTE — Progress Notes (Signed)
D: Pt denies SI/HI/AVH, but noted responding to internal stimuli. Patient's affect is bright, his mood is pleasant, thoughts are disorganized and speech is tangential. Pt appears less anxious and he is interacting with peers and staff appropriately.  A: Pt was offered support and encouragement. Pt was given scheduled medications. Pt was encouraged to attend groups. Q 15 minute checks were done for safety.  R:Pt attends groups and interacts well with peers and staff. Pt is taking medication. Pt has no complaints.Pt receptive to treatment and safety maintained on unit.

## 2016-07-02 NOTE — Progress Notes (Signed)
Pt remains isolative to room for majority of morning.  Occasionally intrusive to nurses station, repeatedly asking same questions, "Why can't I be discharged?", "When can I go home?"  Pressured speech.  Otherwise remains in room, sleeping.  Med compliant.

## 2016-07-02 NOTE — [facility] Group Notes (Signed)
[facility]  Date/Time: 07/02/16, 1pm  Type of Therapy/Topic:  Group Therapy:  Feelings about Diagnosis  Participation Level:  Did Not Attend   Glennon MacSara P Jadin Kagel 07/02/2016, 5:07 PM

## 2016-07-02 NOTE — [facility] Group Notes (Signed)
[facility] Group Notes:  (Nursing/MHT/Case Management/Adjunct)  Date:  07/02/2016  Time:  12:00 AM  Type of Therapy:  Group Therapy  Participation Level:  Active  Participation Quality:  Appropriate  Affect:  Appropriate  Cognitive:  Appropriate  Insight:  Appropriate  Engagement in Group:  Engaged  Modes of Intervention:  Discussion  Summary of Progress/Problems:  Burt EkJanice Marie Merelin Human 07/02/2016, 12:00 AM

## 2016-07-02 NOTE — Progress Notes (Addendum)
Progress towards group home placement: [facility]wanda [facility]. [facility]: was scheduled to meet pt today but called and moved meeting date to Thursday, 6/14. Tami LinLisa Rogers, [facility]: scheduled to meet the pt Wednesday, 6/13. Garner NashGregory Linnie Mcglocklin, MSW, LCSW Clinical Social Worker 07/02/2016 2:16 PM   [facility] contacted for assistance with group home placement. Legal guardian, Corwin LevinsRon Hardie, message left requesting clarity on finances and also help with placement. [facility] contacted.  No care coordination for pts who are receiving an enhanced service. Garner NashGregory Alex Mcmanigal, MSW, LCSW Clinical Social Worker 07/02/2016 3:06 PM   TC Corwin Levinson Hardie, legal guardian. He will begin to pursue placement options as well.  He and his agency have been calling a state appointed guardian Ferne Reus(Ron is guardian of the person) who is involved in the financial issues with pt's inheritance from his mother.  No progress on this, however, Ron reports that [place] DSS is also still involved, as they were guardians prior to this moving to [employer] agency.  Should placement be located, [place] DSS would be able to help make sure funds were available to pay for it. Garner NashGregory Addilyn Satterwhite, MSW, LCSW Clinical Social Worker 07/02/2016 3:53 PM

## 2016-07-02 NOTE — Plan of Care (Signed)
Problem: Health Behavior/Discharge Planning: Goal: Ability to manage health-related needs will improve Outcome: Not Progressing Does not participate in groups or show insight into need for treatment

## 2016-07-02 NOTE — Progress Notes (Signed)
Recreation Therapy Notes  Date: 06.12.18 Time: 9:30 am Location: Craft Room  Group Topic: Self-expression  Goal Area(s) Addresses:  Patient will identify one color per emotion listed on wheel. Patient will verbalize benefit of using art as a means of self-expression. Patient will verbalize one emotion experienced during session. Patient will be educated on other forms of self-expression.  Behavioral Response: Did not attend  Intervention: Emotion Wheel  Activity: Patients were given an Emotion Wheel worksheet and were instructed to pick a color for each emotion listed on the wheel.  Education: LRT educated patients on other forms of self-expression.  Education Outcome: Patient did not attend group.  Clinical Observations/Feedback: Patient did not attend group.  Jacquelynn CreeGreene,Jahmad Petrich M, LRT/CTRS 07/02/2016 10:25 AM

## 2016-07-03 NOTE — Progress Notes (Signed)
Pt remains isolative to room, sleeping most of this thus far.  Upon waking for morning meds, pt asks "Am I going to stay here forever?"  Affect is anxious/worried, tone is tense.  Provided emotional support and notified pt that this unit is not a long term care facility and to speak with physician and/or social worker about discharge plans.  Med compliant.

## 2016-07-03 NOTE — Progress Notes (Signed)
[facility] MD Progress Note  07/03/2016 9:53 AM RIVALDO HINEMAN  MRN:  [id number]  Subjective:  Mr. Trombly is a 56 year old male with schizophrenia admitted floridly psychotic in the context of treatment noncompliance following his mother's death. He is on Clozapine and Depakote improving very slowly with terrible hygiene. He has a new guardian. Awaiting transfer to [facility] or placement if improved.   06/24/2016. Mr. Makin is up this morning and quite agitated. He received a dose of Ativan last night. Spoke with me 5 times already demanding discharge, call sheriff Wynetta Emery and change his guardian to a "white women from psychiatric center on [address]". His hygiene is terrible. Last week he slept most of the time. Last night he slept 4 hours only and has been up all morning. He accepts medications and repports no side effects. Appetite is fair. No program participation.  06/25/2016.Mr. Guzzetta is up and running today. He is asking for discharge with family to "his house". He seems not to understand that he will be placed in a group home. He has lived in a group home before. He refuses to see his guardian and demands a meeting with a "red hair women from mental health center". Apparently this is a member of his ACT team. Unfortunatly the patient has racist tendencies. He takes medications. Sleep and appetite are fair. [facility] is unlikely to take him as they have many conditions for admission, like stable living situation. We will request case manager from his Medicaid provider and a meeting with the ACT team and the guardian.  06/26/2016. Mr. Metayer is more animated today. Unfortunately he "his placement in a group home as he believes that he could sell his mother's house and live independently. He also refuses to go to a group home with black people. He takes medications. No side effects are reported. He took a shower and his hands. However his pants smell of urine.  06/27/2016. Mr. Linker is constipated today. He did not  respond to Mag Citrate. Fleet enema today. He denies any symptoms of depression, anxiety or psychosis. He is still talking to himself but closer to baseline. He is still delusional and refuses placement. He seems not to understand that he has a guardian now, wants to "sell his mother's house". Accepts medications. Hygiene is improving but refuses to wash cloths.  06/28/2016.Mr. domenico achord much better today. He has showered and changed his clothes. This is mostly due to severe diarrhea after he was given laxatives. At last, he was able to recognize the need for hygiene. He met with prospective group home owner yesterday. It did go well except for the fact that he was soiled. He takes medications and has no complaints. He is secluded to his room and skips meals. He did not eat lunch today. His thinking continues to improve and he is now able to hold a brief almost sensible conversation.  06/29/2016. Mr. Smyers feels ready for discharge and is requesting a meetin with his guardian, forgetting that it is not a woman. Accepts medications and tolerates them well. Better hygiene. No behavioral problems.   06/30/2016. Mr. Tumminello is very anxious today believing that I am going to keep him in the hospital forever. He now wants to be dcharged to a group home as soon as possible. He seems at his baseline. We will contact Consepcion Hearing, group home owner, and his guardian to coordinate. He takes medications and tolerates them well. Much better hygiene.  6/11 patient continues to have very poor  hygiene and grooming, with a strong body odor. Patient was in his bed all covered with the sheets. He did not speak to me or uncover his head. He mumbled a few things but I was unable to understand him.  Per nurse's he is slept about 5 hours last night. There are no vital signs from today as he has not allowed the nurse to check his vitals. His blood pressure and heart rate from yesterday were within the normal limits. His oral intake is  good. He has compliant with all medications  6/12 withdrawn to his room. Lying in bed, appears that hygiene has improved, not body odor was detected today. Continues to comply with medications. He denied having any issues or concerns. Denies side effects from medications or physical complaints He did not get out of bed and talk to me while he was covered with sheets. He denies having any  "bad thoughts". He says he was a doctor his guardian.  6/13 patient was lying or engage in assessment today. Hygiene appears to have improved some. He still withdrawing. He is not but dissipated in any programming. He has minimal interactions with staff and no interactions with peers. He continues to be in bed all day. Denies having any problems. Denies any side effects from medications or any physical complaints. Basically his answers are monosyllabic yes or no.   Per nursing: D: Pt denies SI/HI/AVH, patient affect is blunted, mood is bright and he is pleasant and cooperative with treatment plan. Pt stated he wants to go home and he increasingly became anxious and  fixated on the person coming to interview him about the half way house. Patient was redirected several times to wait till tomorrow when he gets to talk to Education officer, museum. Patient has an evening shower and he put on clean clothes, and he was also noted interacting with peers and staff appropriately.  A: Pt was offered support and encouragement. Pt was given scheduled medications. Pt was encouraged to attend groups. Q 15 minute checks were done for safety.  R:Pt attends groups and interacts well with peers and staff. Pt is taking medication. Pt receptive to treatment and safety maintained on unit.  Principal Problem: Schizophrenia, undifferentiated ([place]) Diagnosis:   Patient Active Problem List   Diagnosis Date Noted  . Dyslipidemia [E78.5] 05/27/2016  . Constipation [K59.00] 05/27/2016  . Schizophrenia, undifferentiated ([place]) [F20.3] 05/27/2016  .  Noncompliance [Z91.19] 02/15/2016  . Tobacco use disorder [F17.200] 06/30/2014  . Hypertension [I10] 06/29/2014   Total Time spent with patient: 20 minutes  Past Psychiatric History: schizophrenia.  Past Medical History:  Past Medical History:  Diagnosis Date  . Hypertension   . Schizophrenia [facility])     Past Surgical History:  Procedure Laterality Date  . 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 No pertinent family history. Family Psychiatric  History: none reported. Social History:  History  Alcohol Use No     History  Drug Use No    Social History   Social History  . Marital status: Single    Spouse name: N/A  . Number of children: N/A  . Years of education: N/A   Social History Main Topics  . Smoking status: Current Every Day Smoker    Packs/day: 1.00    Types: Cigarettes  . Smokeless tobacco: Current User  . Alcohol use No  . Drug use: No  . Sexual activity: Not Asked  Other Topics Concern  . None   Social History Narrative  . None   Additional Social History:    History of alcohol / drug use?: No history of alcohol / drug abuse      Current Medications: Current Facility-Administered Medications  Medication Dose Route Frequency Provider Last Rate Last Dose  . acetaminophen (TYLENOL) tablet 650 mg  650 mg Oral Q6H PRN Clapacs, John T, MD      . alum & mag hydroxide-simeth (MAALOX/MYLANTA) 200-200-20 MG/5ML suspension 30 mL  30 mL Oral Q4H PRN Clapacs, John T, MD      . cloZAPine (CLOZARIL) tablet 100 mg  100 mg Oral Daily Clapacs, Madie Reno, MD   100 mg at 07/03/16 0853  . cloZAPine (CLOZARIL) tablet 400 mg  400 mg Oral QHS Clapacs, John T, MD   400 mg at 07/02/16 2241  . desmopressin (DDAVP) tablet 0.2 mg  0.2 mg Oral QHS Pucilowska, [name], MD   0.2 mg at 07/02/16 2241  . divalproex (DEPAKOTE) DR tablet 1,000 mg  1,000 mg Oral Q12H Pucilowska, [name], MD   1,000 mg at 07/03/16 0853  .  docusate sodium (COLACE) capsule 200 mg  200 mg Oral BID Pucilowska, [name], MD   200 mg at 07/03/16 0853  . FLUoxetine (PROZAC) capsule 10 mg  10 mg Oral Daily Hildred Priest, MD   10 mg at 07/03/16 0853  . magnesium hydroxide (MILK OF MAGNESIA) suspension 30 mL  30 mL Oral Daily PRN Clapacs, Madie Reno, MD   30 mL at 06/25/16 2209  . metFORMIN (GLUCOPHAGE) tablet 500 mg  500 mg Oral BID WC Pucilowska, [name], MD   500 mg at 07/03/16 0853  . metoprolol tartrate (LOPRESSOR) tablet 12.5 mg  12.5 mg Oral BID Hildred Priest, MD   12.5 mg at 07/03/16 0854  . polyethylene glycol (MIRALAX / GLYCOLAX) packet 17 g  17 g Oral Daily Pucilowska, [name], MD   17 g at 06/27/16 1156  . senna (SENOKOT) tablet 17.2 mg  2 tablet Oral QHS Clapacs, John T, MD   17.2 mg at 07/02/16 2241  . simvastatin (ZOCOR) tablet 40 mg  40 mg Oral QHS Clapacs, Madie Reno, MD   40 mg at 07/02/16 2242  . temazepam (RESTORIL) capsule 30 mg  30 mg Oral QHS Pucilowska, [name], MD   30 mg at 07/02/16 2241    Lab Results:  No results found for this or any previous visit (from the past 48 hour(s)).  Blood Alcohol level:  Lab Results  Component Value Date   ETH <5 05/26/2016   ETH <5 58/52/7782    Metabolic Disorder Labs: Lab Results  Component Value Date   HGBA1C 5.6 05/28/2016   MPG 114 05/28/2016   No results found for: PROLACTIN Lab Results  Component Value Date   CHOL 139 05/28/2016   TRIG 84 05/28/2016   HDL 43 05/28/2016   CHOLHDL 3.2 05/28/2016   VLDL 17 05/28/2016   LDLCALC 79 05/28/2016   LDLCALC 74 11/08/2014    Physical Findings: AIMS: Facial and Oral Movements Muscles of Facial Expression: None, normal Lips and Perioral Area: None, normal Jaw: None, normal Tongue: None, normal,Extremity Movements Upper (arms, wrists, hands, fingers): Minimal Lower (legs, knees, ankles, toes): Minimal, Trunk Movements Neck, shoulders, hips: None, normal, Overall Severity Severity of  abnormal movements (highest score from questions above): Minimal Incapacitation due to abnormal movements: None, normal Patient's awareness of abnormal movements (rate only patient's report): No  Awareness, Dental Status Current problems with teeth and/or dentures?: Yes (missing teeth ) Does patient usually wear dentures?: No  CIWA:    COWS:     Musculoskeletal: Strength & Muscle Tone: within normal limits Gait & Station: normal Patient leans: N/A  Psychiatric Specialty Exam: Physical Exam  Nursing note and vitals reviewed. Constitutional: He is oriented to person, place, and time. He appears well-developed and well-nourished.  HENT:  Head: Normocephalic and atraumatic.  Eyes: EOM are normal.  Respiratory: Effort normal.  Musculoskeletal: Normal range of motion.  Neurological: He is alert and oriented to person, place, and time.    Review of Systems  Unable to perform ROS: Acuity of condition  Constitutional:       Left upper extremity has been amputated. VEry poor hygeine.    Blood pressure 124/74, pulse 74, temperature 98.3 F (36.8 C), resp. rate 18, height 5' 8"  (1.727 m), weight 80.3 kg (177 lb), SpO2 99 %.Body mass index is 26.91 kg/m.  General Appearance: Fairly Groomed  Eye Contact:  Good  Speech:  Garbled and Pressured  Volume:  Normal  Mood:  Irritable  Affect:  Inappropriate and Labile  Thought Process:  NA  Orientation:  Full (Time, Place, and Person)  Thought Content:  NA, Delusions, Hallucinations: Auditory and Paranoid Ideation  Suicidal Thoughts:  No  Homicidal Thoughts:  No  Memory:  Immediate;   Poor Recent;   Poor Remote;   Poor  Judgement:  Impaired  Insight:  Lacking  Psychomotor Activity:  Increased  Concentration:  Concentration: Poor and Attention Span: Poor  Recall:  Poor  Fund of Knowledge:  Poor  Language:  Poor  Akathisia:  No  Handed:  Right  AIMS (if indicated):     Assets:  Communication Skills Desire for Improvement Financial  Resources/Insurance Physical Health Resilience  ADL's:  Intact  Cognition:  WNL  Sleep:  Number of Hours: 6.5     Treatment Plan Summary: Daily contact with patient to assess and evaluate symptoms and progress in treatment and Medication management   Mr. Grotz is a 56 year old male with a history of schizophrenia admitted for psychotic break in the context of medication noncompliance and severe social stressors.  Schizophrenia:  continue Clozapine 100 mg in the morning 400 mg at night for psychosis and Depakote for irritability, 1000 mg every 12 hours. His last Depakote level on May 31 was 92. Patient is still uncooperative. It is hard to assess. I wonder if patient is suffering from depression as we have been told his mother passed away recently.  Started prozac 20 mg for depression  Agitation: no longer agitated  Insomnia continue Restoril 30 mg daily at bedtime  Enuresis: Patient has been is started on desmopressin  Prevention of metabolic syndrome: Continue metformin 500 mg twice a day  Dyslipidemia: Continue Zocor 40 mg by mouth daily  Constipation continue Senokot 2 tablets at bedtime, Colace 200 mg twice a day and MiraLAX daily  Smoking: Continue nicotine patch  Labs:Marland Kitchen Lipid panel and TSH are normal. HgbA1C 5.6. We restarted Metformin.   EKG. Normal sinus rhythm, QTc 427.  Social: The patient has a new legal guardian.   Disposition. We made referral to [facility]. He will follow up with ACT team.   PPD placed on 6/10  Referral has been made to [facility]. The patient is currently on their waiting list. In the meantime were also trying to prepare things for future placement in a group home.  2 group homes  are coming to evaluate him this week.   Hildred Priest, MD 07/03/2016, 9:53 AM

## 2016-07-03 NOTE — Progress Notes (Signed)
D: Pt denies SI/HI/AVH, patient affect is blunted, mood is bright and he is pleasant and cooperative with treatment plan. Pt stated he wants to go home and he increasingly became anxious and  fixated on the person coming to interview him about the half way house. Patient was redirected several times to wait till tomorrow when he gets to talk to Child psychotherapistsocial worker. Patient has an evening shower and he put on clean clothes, and he was also noted interacting with peers and staff appropriately.  A: Pt was offered support and encouragement. Pt was given scheduled medications. Pt was encouraged to attend groups. Q 15 minute checks were done for safety.  R:Pt attends groups and interacts well with peers and staff. Pt is taking medication. Pt receptive to treatment and safety maintained on unit.

## 2016-07-03 NOTE — Tx Team (Signed)
Interdisciplinary Treatment and Diagnostic Plan Update  07/03/2016 Time of Session: 9:45am Jeffrey [name] MRN: [id number]  Principal Diagnosis: Schizophrenia, undifferentiated (HCC)  Secondary Diagnoses: Principal Problem:   Schizophrenia, undifferentiated (HCC) Active Problems:   Hypertension   Tobacco use disorder   Noncompliance   Dyslipidemia   Constipation   Current Medications:  Current Facility-Administered Medications  Medication Dose Route Frequency Provider Last Rate Last Dose  . acetaminophen (TYLENOL) tablet 650 mg  650 mg Oral Q6H PRN Clapacs, John T, MD      . alum & mag hydroxide-simeth (MAALOX/MYLANTA) 200-200-20 MG/5ML suspension 30 mL  30 mL Oral Q4H PRN Clapacs, John T, MD      . cloZAPine (CLOZARIL) tablet 100 mg  100 mg Oral Daily Clapacs, [name], MD   100 mg at 07/03/16 0853  . cloZAPine (CLOZARIL) tablet 400 mg  400 mg Oral QHS Clapacs, John T, MD   400 mg at 07/02/16 2241  . desmopressin (DDAVP) tablet 0.2 mg  0.2 mg Oral QHS Pucilowska, [name], MD   0.2 mg at 07/02/16 2241  . divalproex (DEPAKOTE) DR tablet 1,000 mg  1,000 mg Oral Q12H Pucilowska, [name], MD   1,000 mg at 07/03/16 0853  . docusate sodium (COLACE) capsule 200 mg  200 mg Oral BID Pucilowska, [name], MD   200 mg at 07/03/16 0853  . FLUoxetine (PROZAC) capsule 10 mg  10 mg Oral Daily Jimmy Footman, MD   10 mg at 07/03/16 0853  . magnesium hydroxide (MILK OF MAGNESIA) suspension 30 mL  30 mL Oral Daily PRN Clapacs, [name], MD   30 mL at 06/25/16 2209  . metFORMIN (GLUCOPHAGE) tablet 500 mg  500 mg Oral BID WC Pucilowska, [name], MD   500 mg at 07/03/16 0853  . metoprolol tartrate (LOPRESSOR) tablet 12.5 mg  12.5 mg Oral BID Jimmy Footman, MD   12.5 mg at 07/03/16 0854  . polyethylene glycol (MIRALAX / GLYCOLAX) packet 17 g  17 g Oral Daily Pucilowska, [name], MD   17 g at 06/27/16 1156  . senna (SENOKOT) tablet 17.2 mg  2 tablet Oral QHS Clapacs, John T,  MD   17.2 mg at 07/02/16 2241  . simvastatin (ZOCOR) tablet 40 mg  40 mg Oral QHS Clapacs, [name], MD   40 mg at 07/02/16 2242  . temazepam (RESTORIL) capsule 30 mg  30 mg Oral QHS Pucilowska, [name], MD   30 mg at 07/02/16 2241   PTA Medications: Prescriptions Prior to Admission  Medication Sig Dispense Refill Last Dose  . cloZAPine (CLOZARIL) 100 MG tablet Take 1-4 tablets (100-400 mg total) by mouth 2 (two) times daily. Patient takes 1 tablet (100 mg) in the morning and 4 tablets (400 mg) at bedtime. (Patient not taking: Reported on 04/26/2016) 150 tablet 0 Not Taking at Unknown time  . paliperidone (INVEGA) 6 MG 24 hr tablet Take 6 mg by mouth 2 (two) times daily.   Not Taking at Unknown time  . senna (SENOKOT) 8.6 MG TABS tablet Take 2 tablets (17.2 mg total) by mouth at bedtime. (Patient not taking: Reported on 04/26/2016) 120 each 0 Not Taking at Unknown time  . simvastatin (ZOCOR) 40 MG tablet Take 40 mg by mouth at bedtime.   Not Taking at Unknown time  . traZODone (DESYREL) 100 MG tablet Take 200 mg by mouth at bedtime.   Not Taking at Unknown time    Patient Stressors: Marital or family conflict Medication change  or noncompliance Traumatic event  Patient Strengths: Astronomer fund of knowledge Physical Health  Treatment Modalities: Medication Management, Group therapy, Case management,  1 to 1 session with clinician, Psychoeducation, Recreational therapy.   Physician Treatment Plan for Primary Diagnosis: Schizophrenia, undifferentiated (HCC) Long Term Goal(s): Improvement in symptoms so as ready for discharge NA   Short Term Goals: Ability to identify changes in lifestyle to reduce recurrence of condition will improve Ability to verbalize feelings will improve Ability to disclose and discuss suicidal ideas Ability to demonstrate self-control will improve Ability to identify and develop effective coping behaviors will improve Compliance with prescribed  medications will improve Ability to identify triggers associated with substance abuse/mental health issues will improve NA  Medication Management: Evaluate patient's response, side effects, and tolerance of medication regimen.  Therapeutic Interventions: 1 to 1 sessions, Unit Group sessions and Medication administration.  Evaluation of Outcomes: Progressing  Physician Treatment Plan for Secondary Diagnosis: Principal Problem:   Schizophrenia, undifferentiated (HCC) Active Problems:   Hypertension   Tobacco use disorder   Noncompliance   Dyslipidemia   Constipation  Long Term Goal(s): Improvement in symptoms so as ready for discharge NA   Short Term Goals: Ability to identify changes in lifestyle to reduce recurrence of condition will improve Ability to verbalize feelings will improve Ability to disclose and discuss suicidal ideas Ability to demonstrate self-control will improve Ability to identify and develop effective coping behaviors will improve Compliance with prescribed medications will improve Ability to identify triggers associated with substance abuse/mental health issues will improve NA     Medication Management: Evaluate patient's response, side effects, and tolerance of medication regimen.  Therapeutic Interventions: 1 to 1 sessions, Unit Group sessions and Medication administration.  Evaluation of Outcomes: Progressing   RN Treatment Plan for Primary Diagnosis: Schizophrenia, undifferentiated (HCC) Long Term Goal(s): Knowledge of disease and therapeutic regimen to maintain health will improve  Short Term Goals: Ability to remain free from injury will improve, Ability to verbalize frustration and anger appropriately will improve, Ability to demonstrate self-control, Ability to participate in decision making will improve and Ability to verbalize feelings will improve  Medication Management: RN will administer medications as ordered by provider, will assess and  evaluate patient's response and provide education to patient for prescribed medication. RN will report any adverse and/or side effects to prescribing provider.  Therapeutic Interventions: 1 on 1 counseling sessions, Psychoeducation, Medication administration, Evaluate responses to treatment, Monitor vital signs and CBGs as ordered, Perform/monitor CIWA, COWS, AIMS and Fall Risk screenings as ordered, Perform wound care treatments as ordered.  Evaluation of Outcomes: Progressing   LCSW Treatment Plan for Primary Diagnosis: Schizophrenia, undifferentiated (HCC) Long Term Goal(s): Safe transition to appropriate next level of care at discharge, Engage patient in therapeutic group addressing interpersonal concerns.  Short Term Goals: Engage patient in aftercare planning with referrals and resources, Increase social support, Increase ability to appropriately verbalize feelings and Increase emotional regulation  Therapeutic Interventions: Assess for all discharge needs, 1 to 1 time with Social worker, Explore available resources and support systems, Assess for adequacy in community support network, Educate family and significant other(s) on suicide prevention, Complete Psychosocial Assessment, Interpersonal group therapy.  Evaluation of Outcomes: Progressing   Progress in Treatment: Attending groups: No. Participating in groups: No. Taking medication as prescribed: Yes. Toleration medication: Yes. Family/Significant other contact made: Yes, individual(s) contacted:  guardian Patient understands diagnosis: Yes. Discussing patient identified problems/goals with staff: Yes. Medical problems stabilized or resolved: Yes. Denies suicidal/homicidal ideation:  Yes. Issues/concerns per patient self-inventory: No. Other: n/a  New problem(s) identified: None identified at this time.   New Short Term/Long Term Goal(s): Patient unable to set goal at this time.   Discharge Plan or Barriers: [facility]  [facility]. Patient is on waitlist.   Patient legal guardian stated that pt's disability money is on "hold" and will not be able to help with placement at this time.  Reason for Continuation of Hospitalization: Aggression Depression Hallucinations  Estimated Length of Stay: 7 days.  Attendees: Patient: 06/26/2016   Physician: Dr. Ardyth HarpsHernandez, MD 07/03/2016   Nursing: Hulan AmatoGwen Farrish, RN 07/03/2016   RN Care Manager: 07/03/2016   Social Worker: Hampton AbbotKadijah Kohl Polinsky, MSW, LCSW-A 07/03/2016   Recreational Therapist: Hershal CoriaBeth Greene, LRT/CTRS  07/03/2016   Other:  07/03/2016   Other:  07/03/2016   Other: 07/03/2016         Scribe for Treatment Team: Lynden OxfordKadijah R Chibuikem Thang, LCSWA 07/03/2016 9:46 AM

## 2016-07-03 NOTE — Progress Notes (Signed)
Tami LinLisa Rogers [facility] visited pt and wants to speak with the residents of her home before making a decision. [facility]: will visit with pt tomorrow AM. [facility] are both [facility]D homes that need a waiver to admit pt.  Will pursue if these options do not work out. Garner NashGregory Danuel Felicetti, MSW, LCSW Clinical Social Worker 07/03/2016 1:18 PM

## 2016-07-03 NOTE — Plan of Care (Signed)
Problem: Coping: Goal: Ability to demonstrate self-control will improve Outcome: Progressing Patient improving in controlling his emotions.

## 2016-07-03 NOTE — Progress Notes (Signed)
Recreation Therapy Notes  Date: 06.13.18 Time: 9:30 am Location: Craft Room  Group Topic: Self-esteem  Goal Area(s) Addresses:  Patient will write at least one positive trait about self. Patient will verbalize benefit of having a healthy self-esteem.  Behavioral Response: Did not attend  Intervention: I Am  Activity: Patients were given a worksheet with the letter I on it and were instructed to write as many positive traits inside the letter.  Education: LRT educated patients on ways to increase their self-esteem.  Education Outcome: Patient did not attend group.  Clinical Observations/Feedback: Patient did not attend group.  Jacquelynn CreeGreene,Ojani Berenson M, LRT/CTRS 07/03/2016 10:11 AM

## 2016-07-03 NOTE — [facility] Group Notes (Signed)
[facility] LCSW Group Therapy  07/03/2016 1:52 PM  Type of Therapy:  Group Therapy  Participation Level:  Patient did not attend group. CSW invited patient to group.   Summary of Progress/Problems: Emotional Regulation: Patients will identify both negative and positive emotions. They will discuss emotions they have difficulty regulating and how they impact their lives. Patients will be asked to identify healthy coping skills to combat unhealthy reactions to negative emotions.   Leilan Bochenek G. Garnette CzechSampson MSW, LCSWA 07/03/2016, 1:52 PM

## 2016-07-03 NOTE — Plan of Care (Signed)
Problem: Coping: Goal: Ability to cope will improve Outcome: Not Progressing Poor participation in treatment

## 2016-07-04 MED ORDER — IPRATROPIUM BROMIDE 0.03 % NA SOLN
2.0000 | Freq: Every day | NASAL | Status: DC
  Administered 2016-07-04 – 2016-07-08 (×4): 2 via NASAL
  Filled 2016-07-04 (×2): qty 30

## 2016-07-04 NOTE — Progress Notes (Signed)
D: Pt denies SI/HI/AVH, but noted responding to internal stimuli. Pt is pleasant and cooperative, affect is flat but brightens open approach. Patient's thought are sometimes disorganized, speech is logical most times  Pt appears less anxious and he is interacting with peers and staff appropriately.  A: Pt was offered support and encouragement. Pt was given scheduled medications. Pt was encouraged to attend groups. Q 15 minute checks were done for safety.  R:Pt attends groups and interacts well with peers and staff. Pt is taking medication. Pt has no complaints.Pt receptive to treatment and safety maintained on unit.

## 2016-07-04 NOTE — Progress Notes (Signed)
D: Pt denies SI/HI/AVH. Pt is pleasant and cooperative. Pt visible on the unit, pt appropriate at this time. Pt interaction is limited to minimal conversation when approached.   A: Pt was offered support and encouragement. Pt was given scheduled medications. Pt was encourage to attend groups. Q 15 minute checks were done for safety.    R:Pt attends groups and interacts well with peers and staff. Pt is taking medication. Pt has no complaints.Pt receptive to treatment and safety maintained on unit.

## 2016-07-04 NOTE — Progress Notes (Signed)
Continues to isolate to room.  No interaction with peers when up in milieu.  Denies SI/HI/AVH.  Support offered.  Safety maintained.  Scheduled medications given.

## 2016-07-04 NOTE — Progress Notes (Signed)
Recreation Therapy Notes  Date: 06.14.18 Time: 9:30 am Location: Craft Room  Group Topic: Leisure Education  Goal Area(s) Addresses:  Patient will identify things they are grateful for. Patient will identify how being grateful can influence decision making.  Behavioral Response: Did not attend  Intervention: Grateful Wheel  Activity: Patients were given an I Am Grateful For worksheet and were instructed to write things they are grateful for under each category.   Education: LRT educated patients on leisure.  Education Outcome: Patient did not attend group.  Clinical Observations/Feedback: Patient did not attend group.  Jacquelynn CreeGreene,Carlisle Enke M, LRT/CTRS 07/04/2016 10:10 AM

## 2016-07-04 NOTE — Progress Notes (Signed)
[facility] MD Progress Note  07/04/2016 2:49 PM RICHARD RITCHEY  MRN:  [id number]  Subjective:  [name] is a 56 year old male with schizophrenia admitted floridly psychotic in the context of treatment noncompliance following his mother's death. He is on Clozapine and Depakote improving very slowly with terrible hygiene. He has a new guardian. Awaiting transfer to [facility] or placement if improved.   06/24/2016. Mr. Hemmer is up this morning and quite agitated. He received a dose of Ativan last night. Spoke with me 5 times already demanding discharge, call sheriff Wynetta Emery and change his guardian to a "white women from psychiatric center on [address]". His hygiene is terrible. Last week he slept most of the time. Last night he slept 4 hours only and has been up all morning. He accepts medications and repports no side effects. Appetite is fair. No program participation.  06/25/2016.Mr. Figg is up and running today. He is asking for discharge with family to "his house". He seems not to understand that he will be placed in a group home. He has lived in a group home before. He refuses to see his guardian and demands a meeting with a "red hair women from mental health center". Apparently this is a member of his ACT team. Unfortunatly the patient has racist tendencies. He takes medications. Sleep and appetite are fair. [facility] is unlikely to take him as they have many conditions for admission, like stable living situation. We will request case manager from his Medicaid provider and a meeting with the ACT team and the guardian.  06/26/2016. Mr. Santosuosso is more animated today. Unfortunately he "his placement in a group home as he believes that he could sell his mother's house and live independently. He also refuses to go to a group home with black people. He takes medications. No side effects are reported. He took a shower and his hands. However his pants smell of urine.  06/27/2016. Mr. Savino is constipated today. He did not  respond to Mag Citrate. Fleet enema today. He denies any symptoms of depression, anxiety or psychosis. He is still talking to himself but closer to baseline. He is still delusional and refuses placement. He seems not to understand that he has a guardian now, wants to "sell his mother's house". Accepts medications. Hygiene is improving but refuses to wash cloths.  06/28/2016.Mr. dayquan buys much better today. He has showered and changed his clothes. This is mostly due to severe diarrhea after he was given laxatives. At last, he was able to recognize the need for hygiene. He met with prospective group home owner yesterday. It did go well except for the fact that he was soiled. He takes medications and has no complaints. He is secluded to his room and skips meals. He did not eat lunch today. His thinking continues to improve and he is now able to hold a brief almost sensible conversation.  06/29/2016. Mr. Kimrey feels ready for discharge and is requesting a meetin with his guardian, forgetting that it is not a woman. Accepts medications and tolerates them well. Better hygiene. No behavioral problems.   06/30/2016. Mr. Siwek is very anxious today believing that I am going to keep him in the hospital forever. He now wants to be dcharged to a group home as soon as possible. He seems at his baseline. We will contact Consepcion Hearing, group home owner, and his guardian to coordinate. He takes medications and tolerates them well. Much better hygiene.  6/11 patient continues to have very poor  hygiene and grooming, with a strong body odor. Patient was in his bed all covered with the sheets. He did not speak to me or uncover his head. He mumbled a few things but I was unable to understand him.  Per nurse's he is slept about 5 hours last night. There are no vital signs from today as he has not allowed the nurse to check his vitals. His blood pressure and heart rate from yesterday were within the normal limits. His oral intake is  good. He has compliant with all medications  6/12 withdrawn to his room. Lying in bed, appears that hygiene has improved, not body odor was detected today. Continues to comply with medications. He denied having any issues or concerns. Denies side effects from medications or physical complaints He did not get out of bed and talk to me while he was covered with sheets. He denies having any  "bad thoughts". He says he was a doctor his guardian.  6/13 patient was lying or engage in assessment today. Hygiene appears to have improved some. He still withdrawing. He is not but dissipated in any programming. He has minimal interactions with staff and no interactions with peers. He continues to be in bed all day. Denies having any problems. Denies any side effects from medications or any physical complaints. Basically his answers are monosyllabic yes or no.  6/14 patient seems a little bit better over the last 48 hours. He is less irritable. He engages slightly more in the assessment and he is less dismissive. He wants to be discharged. Looks like there are issues with his disability money and how to get those funds in order to pay for his group home. Hygiene has improved some. The patient continues to be compliant with meds. He refuses medications earlier this morning but was willing to take them on later on.   Per nursing: D: Pt denies SI/HI/AVH, but noted responding to internal stimuli. Pt is pleasant and cooperative, affect is flat but brightens open approach. Patient's thought are sometimes disorganized, speech is logical most times  Pt appears less anxious and he is interacting with peers and staff appropriately.  A: Pt was offered support and encouragement. Pt was given scheduled medications. Pt was encouraged to attend groups. Q 15 minute checks were done for safety.  R:Pt attends groups and interacts well with peers and staff. Pt is taking medication. Pt has no complaints.Pt receptive to treatment and  safety maintained on unit.  Principal Problem: Schizophrenia, undifferentiated ([place]) Diagnosis:   Patient Active Problem List   Diagnosis Date Noted  . Dyslipidemia [E78.5] 05/27/2016  . Constipation [K59.00] 05/27/2016  . Schizophrenia, undifferentiated ([place]) [F20.3] 05/27/2016  . Noncompliance [Z91.19] 02/15/2016  . Tobacco use disorder [F17.200] 06/30/2014  . Hypertension [I10] 06/29/2014   Total Time spent with patient: 20 minutes  Past Psychiatric History: schizophrenia.  Past Medical History:  Past Medical History:  Diagnosis Date  . Hypertension   . Schizophrenia [facility])     Past Surgical History:  Procedure Laterality Date  . 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 No pertinent family history. Family Psychiatric  History: none reported. Social History:  History  Alcohol Use No     History  Drug Use No    Social History   Social History  . Marital status: Single    Spouse name: N/A  . Number of children: N/A  . Years of education: N/A  Social History Main Topics  . Smoking status: Current Every Day Smoker    Packs/day: 1.00    Types: Cigarettes  . Smokeless tobacco: Current User  . Alcohol use No  . Drug use: No  . Sexual activity: Not Asked   Other Topics Concern  . None   Social History Narrative  . None   Additional Social History:    History of alcohol / drug use?: No history of alcohol / drug abuse      Current Medications: Current Facility-Administered Medications  Medication Dose Route Frequency Provider Last Rate Last Dose  . acetaminophen (TYLENOL) tablet 650 mg  650 mg Oral Q6H PRN Clapacs, John T, MD      . alum & mag hydroxide-simeth (MAALOX/MYLANTA) 200-200-20 MG/5ML suspension 30 mL  30 mL Oral Q4H PRN Clapacs, John T, MD      . cloZAPine (CLOZARIL) tablet 100 mg  100 mg Oral Daily Clapacs, John T, MD   100 mg at 07/04/16 1336  . cloZAPine (CLOZARIL) tablet 400 mg   400 mg Oral QHS Clapacs, Madie Reno, MD   400 mg at 07/03/16 2145  . desmopressin (DDAVP) tablet 0.2 mg  0.2 mg Oral QHS Pucilowska, [name], MD   0.2 mg at 07/03/16 2145  . divalproex (DEPAKOTE) DR tablet 1,000 mg  1,000 mg Oral Q12H Pucilowska, [name], MD   1,000 mg at 07/04/16 1337  . docusate sodium (COLACE) capsule 200 mg  200 mg Oral BID Pucilowska, [name], MD   200 mg at 07/04/16 0900  . FLUoxetine (PROZAC) capsule 10 mg  10 mg Oral Daily Hildred Priest, MD   10 mg at 07/04/16 0900  . magnesium hydroxide (MILK OF MAGNESIA) suspension 30 mL  30 mL Oral Daily PRN Clapacs, Madie Reno, MD   30 mL at 06/25/16 2209  . metFORMIN (GLUCOPHAGE) tablet 500 mg  500 mg Oral BID WC Pucilowska, [name], MD   500 mg at 07/04/16 0901  . metoprolol tartrate (LOPRESSOR) tablet 12.5 mg  12.5 mg Oral BID Hildred Priest, MD   12.5 mg at 07/04/16 0901  . polyethylene glycol (MIRALAX / GLYCOLAX) packet 17 g  17 g Oral Daily Pucilowska, [name], MD   17 g at 06/27/16 1156  . senna (SENOKOT) tablet 17.2 mg  2 tablet Oral QHS Clapacs, John T, MD   17.2 mg at 07/03/16 2145  . simvastatin (ZOCOR) tablet 40 mg  40 mg Oral QHS Clapacs, Madie Reno, MD   40 mg at 07/03/16 2145  . temazepam (RESTORIL) capsule 30 mg  30 mg Oral QHS Pucilowska, [name], MD   30 mg at 07/03/16 2145    Lab Results:  No results found for this or any previous visit (from the past 48 hour(s)).  Blood Alcohol level:  Lab Results  Component Value Date   ETH <5 05/26/2016   ETH <5 86/76/7209    Metabolic Disorder Labs: Lab Results  Component Value Date   HGBA1C 5.6 05/28/2016   MPG 114 05/28/2016   No results found for: PROLACTIN Lab Results  Component Value Date   CHOL 139 05/28/2016   TRIG 84 05/28/2016   HDL 43 05/28/2016   CHOLHDL 3.2 05/28/2016   VLDL 17 05/28/2016   LDLCALC 79 05/28/2016   LDLCALC 74 11/08/2014    Physical Findings: AIMS: Facial and Oral Movements Muscles of Facial Expression:  None, normal Lips and Perioral Area: None, normal Jaw: None, normal Tongue: None, normal,Extremity Movements Upper (arms,  wrists, hands, fingers): Minimal Lower (legs, knees, ankles, toes): Minimal, Trunk Movements Neck, shoulders, hips: None, normal, Overall Severity Severity of abnormal movements (highest score from questions above): Minimal Incapacitation due to abnormal movements: None, normal Patient's awareness of abnormal movements (rate only patient's report): No Awareness, Dental Status Current problems with teeth and/or dentures?: Yes (missing teeth ) Does patient usually wear dentures?: No  CIWA:    COWS:     Musculoskeletal: Strength & Muscle Tone: within normal limits Gait & Station: normal Patient leans: N/A  Psychiatric Specialty Exam: Physical Exam  Nursing note and vitals reviewed. Constitutional: He is oriented to person, place, and time. He appears well-developed and well-nourished.  HENT:  Head: Normocephalic and atraumatic.  Eyes: EOM are normal.  Respiratory: Effort normal.  Musculoskeletal: Normal range of motion.  Neurological: He is alert and oriented to person, place, and time.    Review of Systems  Unable to perform ROS: Acuity of condition  Constitutional:       Left upper extremity has been amputated. VEry poor hygeine.    Blood pressure 124/74, pulse 74, temperature 98.3 F (36.8 C), resp. rate 18, height _0  (1.727 m), weight 80.3 kg (177 lb), SpO2 99 %.Body mass index is 26.91 kg/m.  General Appearance: Fairly Groomed  Eye Contact:  Good  Speech:  Garbled and Pressured  Volume:  Normal  Mood:  Irritable  Affect:  Inappropriate and Labile  Thought Process:  NA  Orientation:  Full (Time, Place, and Person)  Thought Content:  NA, Delusions, Hallucinations: Auditory and Paranoid Ideation  Suicidal Thoughts:  No  Homicidal Thoughts:  No  Memory:  Immediate;   Poor Recent;   Poor Remote;   Poor  Judgement:  Impaired  Insight:  Lacking   Psychomotor Activity:  Increased  Concentration:  Concentration: Poor and Attention Span: Poor  Recall:  Poor  Fund of Knowledge:  Poor  Language:  Poor  Akathisia:  No  Handed:  Right  AIMS (if indicated):     Assets:  Communication Skills Desire for Improvement Financial Resources/Insurance Physical Health Resilience  ADL's:  Intact  Cognition:  WNL  Sleep:  Number of Hours: 5.15     Treatment Plan Summary: Daily contact with patient to assess and evaluate symptoms and progress in treatment and Medication management   Mr. Costlow is a 56 year old male with a history of schizophrenia admitted for psychotic break in the context of medication noncompliance and severe social stressors.  Schizophrenia:  continue Clozapine 100 mg in the morning 400 mg at night for psychosis and Depakote for irritability, 1000 mg every 12 hours. His last Depakote level on May 31 was 92. Patient somewhat more cooperative  Started prozac 20 mg for depression  Agitation: no longer agitated  Insomnia continue Restoril 30 mg daily at bedtime  Enuresis: Patient has been is started on desmopressin  Sialorrhea: Patient has been started on Ativan sublingually at night  Prevention of metabolic syndrome: Continue metformin 500 mg twice a day  Dyslipidemia: Continue Zocor 40 mg by mouth daily  Constipation continue Senokot 2 tablets at bedtime, Colace 200 mg twice a day and MiraLAX daily  Smoking: Continue nicotine patch  Labs:Marland Kitchen Lipid panel and TSH are normal. HgbA1C 5.6. We restarted Metformin.   EKG. Normal sinus rhythm, QTc 427.  Social: The patient has a new legal guardian.   Disposition. We made referral to [facility]. He will follow up with ACT team.   PPD placed on 6/10  Referral has  been made to [facility]. The patient is currently on their waiting list. In the meantime were also trying to prepare things for future placement in a group home.  2 group homes are coming to  evaluate him this week.   Hildred Priest, MD 07/04/2016, 2:49 PM

## 2016-07-04 NOTE — Plan of Care (Signed)
Problem: Coping: Goal: Ability to verbalize feelings will improve Outcome: Progressing Patient verbalized feelings are improving.

## 2016-07-04 NOTE — Progress Notes (Signed)
Patient ID: Jeffrey [name], male   DOB: [birth year], 56 y.o.   MRN: [id number]  CSW and pt met with legal guardian Charisse March today.  Mr. Miguel Dibble talked with pt about his options for living situation at this time.  Pt got upset midway in the conversation and walked out.  Pt very focused on "getting out of here" right now.  Mr. Miguel Dibble has not been able to contact Mr. Denman George, the court appointed guardian of the estate yet but will continue to try.  Mr. Denman George will be able to disperse the SSI funds that have been going to pt's mother. Winferd Humphrey, MSW, LCSW Clinical Social Worker 07/04/2016 10:38 AM

## 2016-07-04 NOTE — [facility] Group Notes (Signed)
[facility] Group Notes:  (Nursing/MHT/Case Management/Adjunct)  Date:  07/04/2016  Time:  10:27 PM  Type of Therapy:  Psychoeducational Skills  Participation Level:  None  Jeffrey [name] 07/04/2016, 10:27 PM

## 2016-07-05 LAB — CBC WITH DIFFERENTIAL/PLATELET
BASOS PCT: 1 %
Basophils Absolute: 0 10*3/uL (ref 0–0.1)
EOS PCT: 0 %
Eosinophils Absolute: 0 10*3/uL (ref 0–0.7)
HEMATOCRIT: 37.3 % — AB (ref 40.0–52.0)
Hemoglobin: 13.2 g/dL (ref 13.0–18.0)
LYMPHS PCT: 34 %
Lymphs Abs: 1.7 10*3/uL (ref 1.0–3.6)
MCH: 29.9 pg (ref 26.0–34.0)
MCHC: 35.3 g/dL (ref 32.0–36.0)
MCV: 84.9 fL (ref 80.0–100.0)
MONO ABS: 0.7 10*3/uL (ref 0.2–1.0)
MONOS PCT: 14 %
NEUTROS ABS: 2.6 10*3/uL (ref 1.4–6.5)
Neutrophils Relative %: 51 %
Platelets: 160 10*3/uL (ref 150–440)
RBC: 4.39 MIL/uL — ABNORMAL LOW (ref 4.40–5.90)
RDW: 13.4 % (ref 11.5–14.5)
WBC: 5 10*3/uL (ref 3.8–10.6)

## 2016-07-05 NOTE — [facility] Group Notes (Signed)
[facility] LCSW Group Therapy Note  Date/Time: 07/05/16, 1300  Type of Therapy and Topic:  Group Therapy:  Feelings around Relapse and Recovery  Participation Level:  Active   Mood:pleasant  Description of Group:    Patients in this group will discuss emotions they experience before and after a relapse. They will process how experiencing these feelings, or avoidance of experiencing them, relates to having a relapse. Facilitator will guide patients to explore emotions they have related to recovery. Patients will be encouraged to process which emotions are more powerful. They will be guided to discuss the emotional reaction significant others in their lives may have to patients' relapse or recovery. Patients will be assisted in exploring ways to respond to the emotions of others without this contributing to a relapse.  Therapeutic Goals: 1. Patient will identify two or more emotions that lead to relapse for them:  2. Patient will identify two emotions that result when they relapse:  3. Patient will identify two emotions related to recovery:  4. Patient will demonstrate ability to communicate their needs through discussion and/or role plays.   Summary of Patient Progress: Pt came to group with much energy today and was talkative to the point of interupting other group members. PT did slow himself down, allowed others to speak, and took an appropriate place in the group.  Pt talked about his mother's death today and interacted with several other group members who had also experienced loss of loved ones.  This was most appropriate interactions CSW had seen with this pt.     Therapeutic Modalities:   Cognitive Behavioral Therapy Solution-Focused Therapy Assertiveness Training Relapse Prevention Therapy  Daleen SquibbGreg Milissa Fesperman, LCSW

## 2016-07-05 NOTE — Progress Notes (Addendum)
Pt has been accepted by [facility] with placement on Monday.  Only issue is that pt disability funds are still administered by [employer]/[place]. Legal Silas FloodGuardian Ron Hardie, [phone], is working on this and this needs to be verified to SYSCOLisa Roger's satisfaction. Garner NashGregory Seren Chaloux, MSW, LCSW Clinical Social Worker 07/05/2016 3:35 PM

## 2016-07-05 NOTE — Progress Notes (Signed)
Denies SI/HI/AVH.   Affect blunted.  Minimal interaction.  No inappropriate behavior.  Support offered.  Safety maintained.

## 2016-07-05 NOTE — Progress Notes (Signed)
Recreation Therapy Notes  Date: 06.15.18 Time: 9:30 am Location: Craft Room  Group Topic: Coping Skills  Goal Area(s) Addresses:  Patient will verbalize one emotion experienced in group. Patient will verbalize benefit of using art as a healthy coping skill.  Behavioral Response: Attentive  Intervention: Coloring  Activity: Patients were given coloring sheets to color and were instructed to think about what emotions they were feeling and what their minds were focused on.  Education: LRT educated patients on healthy coping skills.  Education Outcome: In group clarification offered  Clinical Observations/Feedback: Patient colored coloring sheet. Patient would look at peer multiple times. LRT kept patient engaged in conversations. Patient stated he wanted to see Pryor MontesKiss in concert and he wanted to go [place] [employer] [place].  Jacquelynn CreeGreene,Natalina Wieting M, LRT/CTRS 07/05/2016 10:29 AM

## 2016-07-05 NOTE — Progress Notes (Signed)
[facility] MD Progress Note  07/05/2016 1:26 PM Jeffrey [name]  MRN:  [id number]  Subjective:  [name] is a 56 year old male with schizophrenia admitted floridly psychotic in the context of treatment noncompliance following his mother's death. He is on Clozapine and Depakote improving very slowly with terrible hygiene. He has a new guardian. Awaiting transfer to [facility] or placement if improved.   06/24/2016. Mr. [name] is up this morning and quite agitated. He received a dose of Ativan last night. Spoke with me 5 times already demanding discharge, call sheriff Wynetta Emery and change his guardian to a "white women from psychiatric center [address]". His hygiene is terrible. Last week he slept most of the time. Last night he slept 4 hours only and has been up all morning. He accepts medications and repports no side effects. Appetite is fair. No program participation.  06/25/2016.Mr. [name] is up and running today. He is asking for discharge with family to "his house". He seems not to understand that he will be placed in a group home. He has lived in a group home before. He refuses to see his guardian and demands a meeting with a "red hair women from mental health center". Apparently this is a member of his ACT team. Unfortunatly the patient has racist tendencies. He takes medications. Sleep and appetite are fair. [facility] is unlikely to take him as they have many conditions for admission, like stable living situation. We will request case manager from his Medicaid provider and a meeting with the ACT team and the guardian.  06/26/2016. Mr. [name] is more animated today. Unfortunately he "his placement in a group home as he believes that he could sell his mother's house and live independently. He also refuses to go to a group home with black people. He takes medications. No side effects are reported. He took a shower and his hands. However his pants smell of urine.  06/27/2016. Mr. [name] is constipated today. He did not  respond to Mag Citrate. Fleet enema today. He denies any symptoms of depression, anxiety or psychosis. He is still talking to himself but closer to baseline. He is still delusional and refuses placement. He seems not to understand that he has a guardian now, wants to "sell his mother's house". Accepts medications. Hygiene is improving but refuses to wash cloths.  06/28/2016.Mr. franklyn cafaro much better today. He has showered and changed his clothes. This is mostly due to severe diarrhea after he was given laxatives. At last, he was able to recognize the need for hygiene. He met with prospective group home owner yesterday. It did go well except for the fact that he was soiled. He takes medications and has no complaints. He is secluded to his room and skips meals. He did not eat lunch today. His thinking continues to improve and he is now able to hold a brief almost sensible conversation.  06/29/2016. Mr. [name] feels ready for discharge and is requesting a meetin with his guardian, forgetting that it is not a woman. Accepts medications and tolerates them well. Better hygiene. No behavioral problems.   06/30/2016. Mr. [name] is very anxious today believing that I am going to keep him in the hospital forever. He now wants to be dcharged to a group home as soon as possible. He seems at his baseline. We will contact Consepcion Hearing, group home owner, and his guardian to coordinate. He takes medications and tolerates them well. Much better hygiene.  6/11 patient continues to have very poor  hygiene and grooming, with a strong body odor. Patient was in his bed all covered with the sheets. He did not speak to me or uncover his head. He mumbled a few things but I was unable to understand him.  Per nurse's he is slept about 5 hours last night. There are no vital signs from today as he has not allowed the nurse to check his vitals. His blood pressure and heart rate from yesterday were within the normal limits. His oral intake is  good. He has compliant with all medications  6/12 withdrawn to his room. Lying in bed, appears that hygiene has improved, not body odor was detected today. Continues to comply with medications. He denied having any issues or concerns. Denies side effects from medications or physical complaints He did not get out of bed and talk to me while he was covered with sheets. He denies having any  "bad thoughts". He says he was a doctor his guardian.  6/13 patient was lying or engage in assessment today. Hygiene appears to have improved some. He still withdrawing. He is not but dissipated in any programming. He has minimal interactions with staff and no interactions with peers. He continues to be in bed all day. Denies having any problems. Denies any side effects from medications or any physical complaints. Basically his answers are monosyllabic yes or no.  6/14 patient seems a little bit better over the last 48 hours. He is less irritable. He engages slightly more in the assessment and he is less dismissive. He wants to be discharged. Looks like there are issues with his disability money and how to get those funds in order to pay for his group home. Hygiene has improved some. The patient continues to be compliant with meds. He refuses medications earlier this morning but was willing to take later on.  6/15 seems to be less withdrawn. He has been seen in the day room more often. Today he was sitting there watching him basketball team. He told me he was cheering for [place]. His hygiene and grooming are much improved. He has been compliant with all of his medications. He has been sleeping well at night. He has not been disruptive in the unit. No episodes of aggression or agitation. He denies side effects from medications, physical complaints, suicidality, homicidality or hallucinations.   Per nursing: D: Pt denies SI/HI/AVH. Pt is pleasant and cooperative. Pt visible on the unit, pt appropriate at this time. Pt  interaction is limited to minimal conversation when approached.   A: Pt was offered support and encouragement. Pt was given scheduled medications. Pt was encourage to attend groups. Q 15 minute checks were done for safety.    R:Pt attends groups and interacts well with peers and staff. Pt is taking medication. Pt has no complaints.Pt receptive to treatment and safety maintained on unit.  Principal Problem: Schizophrenia, undifferentiated ([place]) Diagnosis:   Patient Active Problem List   Diagnosis Date Noted  . Dyslipidemia [E78.5] 05/27/2016  . Constipation [K59.00] 05/27/2016  . Schizophrenia, undifferentiated ([place]) [F20.3] 05/27/2016  . Noncompliance [Z91.19] 02/15/2016  . Tobacco use disorder [F17.200] 06/30/2014  . Hypertension [I10] 06/29/2014   Total Time spent with patient: 20 minutes  Past Psychiatric History: schizophrenia.  Past Medical History:  Past Medical History:  Diagnosis Date  . Hypertension   . Schizophrenia [facility])     Past Surgical History:  Procedure Laterality Date  . 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 No pertinent family history. Family Psychiatric  History: none reported. Social History:  History  Alcohol Use No     History  Drug Use No    Social History   Social History  . Marital status: Single    Spouse name: N/A  . Number of children: N/A  . Years of education: N/A   Social History Main Topics  . Smoking status: Current Every Day Smoker    Packs/day: 1.00    Types: Cigarettes  . Smokeless tobacco: Current User  . Alcohol use No  . Drug use: No  . Sexual activity: Not Asked   Other Topics Concern  . None   Social History Narrative  . None   Additional Social History:    History of alcohol / drug use?: No history of alcohol / drug abuse      Current Medications: Current Facility-Administered Medications  Medication Dose Route Frequency Provider Last Rate  Last Dose  . acetaminophen (TYLENOL) tablet 650 mg  650 mg Oral Q6H PRN Clapacs, John T, MD      . alum & mag hydroxide-simeth (MAALOX/MYLANTA) 200-200-20 MG/5ML suspension 30 mL  30 mL Oral Q4H PRN Clapacs, John T, MD      . cloZAPine (CLOZARIL) tablet 100 mg  100 mg Oral Daily Clapacs, John T, MD   100 mg at 07/05/16 1039  . cloZAPine (CLOZARIL) tablet 400 mg  400 mg Oral QHS Clapacs, Madie Reno, MD   400 mg at 07/04/16 2127  . desmopressin (DDAVP) tablet 0.2 mg  0.2 mg Oral QHS Pucilowska, [name], MD   0.2 mg at 07/04/16 2128  . divalproex (DEPAKOTE) DR tablet 1,000 mg  1,000 mg Oral Q12H Pucilowska, [name], MD   1,000 mg at 07/05/16 1039  . docusate sodium (COLACE) capsule 200 mg  200 mg Oral BID Pucilowska, [name], MD   200 mg at 07/05/16 1042  . FLUoxetine (PROZAC) capsule 10 mg  10 mg Oral Daily Hildred Priest, MD   10 mg at 07/05/16 1040  . ipratropium (ATROVENT) 0.03 % nasal spray 2 spray  2 spray Nasal QHS Hildred Priest, MD   2 spray at 07/04/16 2146  . magnesium hydroxide (MILK OF MAGNESIA) suspension 30 mL  30 mL Oral Daily PRN Clapacs, Madie Reno, MD   30 mL at 06/25/16 2209  . metFORMIN (GLUCOPHAGE) tablet 500 mg  500 mg Oral BID WC Pucilowska, [name], MD   500 mg at 07/05/16 1039  . metoprolol tartrate (LOPRESSOR) tablet 12.5 mg  12.5 mg Oral BID Hildred Priest, MD   12.5 mg at 07/05/16 1039  . polyethylene glycol (MIRALAX / GLYCOLAX) packet 17 g  17 g Oral Daily Pucilowska, [name], MD   17 g at 06/27/16 1156  . senna (SENOKOT) tablet 17.2 mg  2 tablet Oral QHS Clapacs, John T, MD   17.2 mg at 07/04/16 2127  . simvastatin (ZOCOR) tablet 40 mg  40 mg Oral QHS Clapacs, Madie Reno, MD   40 mg at 07/04/16 2128  . temazepam (RESTORIL) capsule 30 mg  30 mg Oral QHS Pucilowska, [name], MD   30 mg at 07/04/16 2128    Lab Results:  Results for orders placed or performed during the hospital encounter of 05/27/16 (from the past 48 hour(s))  CBC with  Differential/Platelet     Status: Abnormal   Collection Time: 07/05/16  9:25 AM  Result Value Ref Range   WBC  5.0 3.8 - 10.6 K/uL   RBC 4.39 (L) 4.40 - 5.90 MIL/uL   Hemoglobin 13.2 13.0 - 18.0 g/dL   HCT 37.3 (L) 40.0 - 52.0 %   MCV 84.9 80.0 - 100.0 fL   MCH 29.9 26.0 - 34.0 pg   MCHC 35.3 32.0 - 36.0 g/dL   RDW 13.4 11.5 - 14.5 %   Platelets 160 150 - 440 K/uL   Neutrophils Relative % 51 %   Neutro Abs 2.6 1.4 - 6.5 K/uL   Lymphocytes Relative 34 %   Lymphs Abs 1.7 1.0 - 3.6 K/uL   Monocytes Relative 14 %   Monocytes Absolute 0.7 0.2 - 1.0 K/uL   Eosinophils Relative 0 %   Eosinophils Absolute 0.0 0 - 0.7 K/uL   Basophils Relative 1 %   Basophils Absolute 0.0 0 - 0.1 K/uL    Blood Alcohol level:  Lab Results  Component Value Date   ETH <5 05/26/2016   ETH <5 65/46/5035    Metabolic Disorder Labs: Lab Results  Component Value Date   HGBA1C 5.6 05/28/2016   MPG 114 05/28/2016   No results found for: PROLACTIN Lab Results  Component Value Date   CHOL 139 05/28/2016   TRIG 84 05/28/2016   HDL 43 05/28/2016   CHOLHDL 3.2 05/28/2016   VLDL 17 05/28/2016   LDLCALC 79 05/28/2016   LDLCALC 74 11/08/2014    Physical Findings: AIMS: Facial and Oral Movements Muscles of Facial Expression: None, normal Lips and Perioral Area: None, normal Jaw: None, normal Tongue: None, normal,Extremity Movements Upper (arms, wrists, hands, fingers): Minimal Lower (legs, knees, ankles, toes): Minimal, Trunk Movements Neck, shoulders, hips: None, normal, Overall Severity Severity of abnormal movements (highest score from questions above): Minimal Incapacitation due to abnormal movements: None, normal Patient's awareness of abnormal movements (rate only patient's report): No Awareness, Dental Status Current problems with teeth and/or dentures?: Yes (missing teeth ) Does patient usually wear dentures?: No  CIWA:    COWS:     Musculoskeletal: Strength & Muscle Tone: within normal  limits Gait & Station: normal Patient leans: N/A  Psychiatric Specialty Exam: Physical Exam  Nursing note and vitals reviewed. Constitutional: He is oriented to person, place, and time. He appears well-developed and well-nourished.  HENT:  Head: Normocephalic and atraumatic.  Eyes: Conjunctivae and EOM are normal.  Neck: Normal range of motion.  Respiratory: Effort normal.  Musculoskeletal: Normal range of motion.  Neurological: He is alert and oriented to person, place, and time.    Review of Systems  Constitutional: Negative.        Left upper extremity has been amputated. VEry poor hygeine.  HENT: Negative.   Eyes: Negative.   Respiratory: Negative.   Cardiovascular: Negative.   Gastrointestinal: Negative.   Genitourinary: Negative.   Musculoskeletal: Negative.   Skin: Negative.   Neurological: Negative.   Endo/Heme/Allergies: Negative.   Psychiatric/Behavioral: Negative.     Blood pressure 122/76, pulse 77, temperature 97.7 F (36.5 C), temperature source Oral, resp. rate 19, height 5' 8"  (1.727 m), weight 80.3 kg (177 lb), SpO2 99 %.Body mass index is 26.91 kg/m.  General Appearance: Fairly Groomed  Eye Contact:  Good  Speech:  Garbled and Pressured  Volume:  Normal  Mood:  Irritable  Affect:  Constricted  Thought Process:  Linear and Descriptions of Associations: Intact  Orientation:  Full (Time, Place, and Person)  Thought Content:  Logical  Suicidal Thoughts:  No  Homicidal Thoughts:  No  Memory:  Immediate;   Poor Recent;   Poor Remote;   Poor  Judgement:  Impaired  Insight:  Lacking  Psychomotor Activity:  Increased  Concentration:  Concentration: Poor and Attention Span: Poor  Recall:  Poor  Fund of Knowledge:  Poor  Language:  Poor  Akathisia:  No  Handed:  Right  AIMS (if indicated):     Assets:  Communication Skills Desire for Improvement Financial Resources/Insurance Physical Health Resilience  ADL's:  Intact  Cognition:  WNL  Sleep:   Number of Hours: 5     Treatment Plan Summary: Daily contact with patient to assess and evaluate symptoms and progress in treatment and Medication management   Mr. Bralley is a 56 year old male with a history of schizophrenia admitted for psychotic break in the context of medication noncompliance and severe social stressors.  Schizophrenia:  continue Clozapine 100 mg in the morning 400 mg at night for psychosis and Depakote for irritability, 1000 mg every 12 hours. His last Depakote level on May 31 was 92.   Started prozac 20 mg for depression  Agitation: no longer agitated  Insomnia continue Restoril 30 mg daily at bedtime  Enuresis: Patient has been is started on desmopressin  Sialorrhea: Patient has been started on Atrovent sublingually at night  Prevention of metabolic syndrome: Continue metformin 500 mg twice a day  Dyslipidemia: Continue Zocor 40 mg by mouth daily  Constipation continue Senokot 2 tablets at bedtime, Colace 200 mg twice a day and MiraLAX daily  Smoking: Continue nicotine patch  Labs:Marland Kitchen Lipid panel and TSH are normal. HgbA1C 5.6. We restarted Metformin.   EKG. Normal sinus rhythm, QTc 427.  Social: The patient has a new legal guardian.   Disposition. We made referral to [facility]. He will follow up with ACT team.   PPD placed on 6/10  Referral has been made to [facility]. The patient is currently on their waiting list. In the meantime were also trying to prepare things for future placement in a group home.  2 group homes are coming to evaluate him this week.--- He has been accepted into a group home. Now the guardians trying to resolve the issue related to his disability check which is what is preventing him from being able to be placed.  We hope to be able to discharge Next week. Currently were unable to discharge as there is no safe disposition   Hildred Priest, MD 07/05/2016, 1:26 PM

## 2016-07-05 NOTE — [facility] Group Notes (Signed)
[facility] Group Notes:  (Nursing/MHT/Case Management/Adjunct)  Date:  07/05/2016  Time:  4:04 PM  Type of Therapy:  Psychoeducational Skills  Participation Level:  Did Not Attend     Mickey Farberamela M Moses Ellison 07/05/2016, 4:04 PM

## 2016-07-05 NOTE — Progress Notes (Signed)
MEDICATION RELATED CONSULT NOTE - FOLLOW UP   Pharmacy Consult for Clozapine Monitoring  Indication: Schizophrenia  No Known Allergies  Patient Measurements: Height: 5\' 8"  (172.7 cm) Weight: 177 lb (80.3 kg) IBW/kg (Calculated) : 68.4   Vital Signs: Temp: 97.7 F (36.5 C) (06/15 0624) Temp Source: Oral (06/15 0624) BP: 122/76 (06/15 0624) Pulse Rate: 77 (06/15 0624)  Labs:  Recent Labs  07/05/16 0925  WBC 5.0  HGB 13.2  HCT 37.3*  PLT 160   CrCl cannot be calculated (Patient's most recent lab result is older than the maximum 21 days allowed.).   6/15  ANC= 2.6  Plan:  Patient registered and eligible with [facility] under Dr. Toni Amendlapacs  [id number] Zip [phone] Patient lab information submitted to Clozapine [facility] program.  Next ANC due 07/12/16.  Jeffrey [name],Jeffrey [name], Digest[facility] Health SpecialistsRPH 07/05/2016 10:59 AM

## 2016-07-06 NOTE — Plan of Care (Signed)
Problem: Safety: Goal: Periods of time without injury will increase Outcome: Progressing Pt remains safe from injury

## 2016-07-06 NOTE — Plan of Care (Signed)
Problem: Safety: Goal: Ability to redirect hostility and anger into socially appropriate behaviors will improve Outcome: Progressing Patient was observed in the dayroom with peers this evening.  He watched sports on television and behavior was appropriate.  He requested medications between activities.  He took medications without event. Mood was calm and behavior was pleasant this evening.

## 2016-07-06 NOTE — Progress Notes (Signed)
Pt is withdrawn to room. He is encouraged by staff and RN to eat and participate in groups. Pt was compliant with all am medications. He has a irritable mood today stated,    " Okay I will get up" but continues to stay in bed isolative to room. He denies SI, HI , a/ hallucinations. Will continue to monitor.

## 2016-07-06 NOTE — [facility] Group Notes (Signed)
[facility] Group Notes:  (Nursing/MHT/Case Management/Adjunct)  Date:  07/06/2016  Time:  6:00 AM  Type of Therapy:  Psychoeducational Skills  Participation Level:  Active  Participation Quality:  Appropriate and Attentive  Affect:  Appropriate  Cognitive:  Appropriate  Insight:  Appropriate and Good  Engagement in Group:  Engaged  Modes of Intervention:  Discussion, Socialization and Support  Summary of Progress/Problems:  Jeffrey MilroyLaquanda Y Chancie [name] 07/06/2016, 6:00 AM

## 2016-07-06 NOTE — Progress Notes (Signed)
[facility] MD Progress Note  07/06/2016 6:05 PM Jeffrey [name]  MRN:  [id number]  Subjective:  [name] is a 56 year old male with schizophrenia admitted floridly psychotic in the context of treatment noncompliance following his mother's death. He is on Clozapine and Depakote improving very slowly with terrible hygiene. He has a new guardian. Awaiting transfer to [facility] or placement if improved.   06/24/2016. [name] is up this morning and quite agitated. He received a dose of Ativan last night. Spoke with me 5 times already demanding discharge, call sheriff Wynetta Emery and change his guardian to a "white women from psychiatric center on [address]". His hygiene is terrible. Last week he slept most of the time. Last night he slept 4 hours only and has been up all morning. He accepts medications and repports no side effects. Appetite is fair. No program participation.  06/25/2016.[name] is up and running today. He is asking for discharge with family to "his house". He seems not to understand that he will be placed in a group home. He has lived in a group home before. He refuses to see his guardian and demands a meeting with a "red hair women from mental health center". Apparently this is a member of his ACT team. Unfortunatly the patient has racist tendencies. He takes medications. Sleep and appetite are fair. [facility] is unlikely to take him as they have many conditions for admission, like stable living situation. We will request case manager from his Medicaid provider and a meeting with the ACT team and the guardian.  06/26/2016. [name] is more animated today. Unfortunately he "his placement in a group home as he believes that he could sell his mother's house and live independently. He also refuses to go to a group home with black people. He takes medications. No side effects are reported. He took a shower and his hands. However his pants smell of urine.  06/27/2016. [name] is constipated today. He did not  respond to Mag Citrate. Fleet enema today. He denies any symptoms of depression, anxiety or psychosis. He is still talking to himself but closer to baseline. He is still delusional and refuses placement. He seems not to understand that he has a guardian now, wants to "sell his mother's house". Accepts medications. Hygiene is improving but refuses to wash cloths.  06/28/2016.Jeffrey [name] much better today. He has showered and changed his clothes. This is mostly due to severe diarrhea after he was given laxatives. At last, he was able to recognize the need for hygiene. He met with prospective group home owner yesterday. It did go well except for the fact that he was soiled. He takes medications and has no complaints. He is secluded to his room and skips meals. He did not eat lunch today. His thinking continues to improve and he is now able to hold a brief almost sensible conversation.  06/29/2016. Jeffrey [name] feels ready for discharge and is requesting a meetin with his guardian, forgetting that it is not a woman. Accepts medications and tolerates them well. Better hygiene. No behavioral problems.   06/30/2016. Jeffrey [name] is very anxious today believing that I am going to keep him in the hospital forever. He now wants to be dcharged to a group home as soon as possible. He seems at his baseline. We will contact Consepcion Hearing, group home owner, and his guardian to coordinate. He takes medications and tolerates them well. Much better hygiene.  6/11 patient continues to have very poor  hygiene and grooming, with a strong body odor. Patient was in his bed all covered with the sheets. He did not speak to me or uncover his head. He mumbled a few things but I was unable to understand him.  Per nurse's he is slept about 5 hours last night. There are no vital signs from today as he has not allowed the nurse to check his vitals. His blood pressure and heart rate from yesterday were within the normal limits. His oral intake is  good. He has compliant with all medications  6/12 withdrawn to his room. Lying in bed, appears that hygiene has improved, not body odor was detected today. Continues to comply with medications. He denied having any issues or concerns. Denies side effects from medications or physical complaints He did not get out of bed and talk to me while he was covered with sheets. He denies having any  "bad thoughts". He says he was a doctor his guardian.  6/13 patient was lying or engage in assessment today. Hygiene appears to have improved some. He still withdrawing. He is not but dissipated in any programming. He has minimal interactions with staff and no interactions with peers. He continues to be in bed all day. Denies having any problems. Denies any side effects from medications or any physical complaints. Basically his answers are monosyllabic yes or no.  6/14 patient seems a little bit better over the last 48 hours. He is less irritable. He engages slightly more in the assessment and he is less dismissive. He wants to be discharged. Looks like there are issues with his disability money and how to get those funds in order to pay for his group home. Hygiene has improved some. The patient continues to be compliant with meds. He refuses medications earlier this morning but was willing to take later on.  6/15 seems to be less withdrawn. He has been seen in the day room more often. Today he was sitting there watching him basketball team. He told me he was cheering for [place]. His hygiene and grooming are much improved. He has been compliant with all of his medications. He has been sleeping well at night. He has not been disruptive in the unit. No episodes of aggression or agitation. He denies side effects from medications, physical complaints, suicidality, homicidality or hallucinations.  6/16 The patient has been intermittently irritable. He stays fairly reclusive to his room but grooming has improved in hygiene  has improved. He does not attend any groups and interacts very little with peers. He denies any current active or passive suicidal thoughts or psychosis. He denies any auditory or visual hallucinations. At times he gets confused and cannot remember why he is in the hospital. Insight and judgment are poor with regards to discharge plans and the need to go to a group home. There've been no episodes of aggression or agitation. He is tolerating psychotropic medications fairly well. He denies any current severe depressive symptoms or suicidal thoughts. No homicidal thoughts. He denies any medical complaints. Vital signs are stable.   Principal Problem: Schizophrenia, undifferentiated ([place]) Diagnosis:   Patient Active Problem List   Diagnosis Date Noted  . Dyslipidemia [E78.5] 05/27/2016  . Constipation [K59.00] 05/27/2016  . Schizophrenia, undifferentiated ([place]) [F20.3] 05/27/2016  . Noncompliance [Z91.19] 02/15/2016  . Tobacco use disorder [F17.200] 06/30/2014  . Hypertension [I10] 06/29/2014   Total Time spent with patient: 20 minutes  Past Psychiatric History: schizophrenia.  Past Medical History:  Past Medical History:  Diagnosis  Date  . Hypertension   . Schizophrenia [facility])     Past Surgical History:  Procedure Laterality Date  . 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 No pertinent family history. Family Psychiatric  History: none reported. Social History:  History  Alcohol Use No     History  Drug Use No    Social History   Social History  . Marital status: Single    Spouse name: N/A  . Number of children: N/A  . Years of education: N/A   Social History Main Topics  . Smoking status: Current Every Day Smoker    Packs/day: 1.00    Types: Cigarettes  . Smokeless tobacco: Current User  . Alcohol use No  . Drug use: No  . Sexual activity: Not Asked   Other Topics Concern  . None   Social History Narrative   . None   Additional Social History:    History of alcohol / drug use?: No history of alcohol / drug abuse      Current Medications: Current Facility-Administered Medications  Medication Dose Route Frequency Provider Last Rate Last Dose  . acetaminophen (TYLENOL) tablet 650 mg  650 mg Oral Q6H PRN Clapacs, John T, MD      . alum & mag hydroxide-simeth (MAALOX/MYLANTA) 200-200-20 MG/5ML suspension 30 mL  30 mL Oral Q4H PRN Clapacs, John T, MD      . cloZAPine (CLOZARIL) tablet 100 mg  100 mg Oral Daily Clapacs, Madie Reno, MD   100 mg at 07/06/16 0818  . cloZAPine (CLOZARIL) tablet 400 mg  400 mg Oral QHS Clapacs, Madie Reno, MD   400 mg at 07/05/16 2115  . desmopressin (DDAVP) tablet 0.2 mg  0.2 mg Oral QHS Pucilowska, [name], MD   0.2 mg at 07/05/16 2117  . divalproex (DEPAKOTE) DR tablet 1,000 mg  1,000 mg Oral Q12H Pucilowska, [name], MD   1,000 mg at 07/06/16 0818  . docusate sodium (COLACE) capsule 200 mg  200 mg Oral BID Pucilowska, [name], MD   200 mg at 07/06/16 1718  . FLUoxetine (PROZAC) capsule 10 mg  10 mg Oral Daily Hildred Priest, MD   10 mg at 07/06/16 0818  . ipratropium (ATROVENT) 0.03 % nasal spray 2 spray  2 spray Nasal QHS Hildred Priest, MD   2 spray at 07/04/16 2146  . magnesium hydroxide (MILK OF MAGNESIA) suspension 30 mL  30 mL Oral Daily PRN Clapacs, Madie Reno, MD   30 mL at 06/25/16 2209  . metFORMIN (GLUCOPHAGE) tablet 500 mg  500 mg Oral BID WC Pucilowska, [name], MD   500 mg at 07/06/16 1718  . metoprolol tartrate (LOPRESSOR) tablet 12.5 mg  12.5 mg Oral BID Hildred Priest, MD   12.5 mg at 07/06/16 1718  . polyethylene glycol (MIRALAX / GLYCOLAX) packet 17 g  17 g Oral Daily Pucilowska, [name], MD   17 g at 07/06/16 0818  . senna (SENOKOT) tablet 17.2 mg  2 tablet Oral QHS Clapacs, John T, MD   17.2 mg at 07/05/16 2117  . simvastatin (ZOCOR) tablet 40 mg  40 mg Oral QHS Clapacs, Madie Reno, MD   40 mg at 07/05/16 2118  .  temazepam (RESTORIL) capsule 30 mg  30 mg Oral QHS Pucilowska, [name], MD   30 mg at 07/05/16 2119    Lab Results:  Results for orders placed or performed during the  hospital encounter of 05/27/16 (from the past 48 hour(s))  CBC with Differential/Platelet     Status: Abnormal   Collection Time: 07/05/16  9:25 AM  Result Value Ref Range   WBC 5.0 3.8 - 10.6 K/uL   RBC 4.39 (L) 4.40 - 5.90 MIL/uL   Hemoglobin 13.2 13.0 - 18.0 g/dL   HCT 09.4 (L) 99.0 - 83.2 %   MCV 84.9 80.0 - 100.0 fL   MCH 29.9 26.0 - 34.0 pg   MCHC 35.3 32.0 - 36.0 g/dL   RDW 47.8 03.7 - 50.4 %   Platelets 160 150 - 440 K/uL   Neutrophils Relative % 51 %   Neutro Abs 2.6 1.4 - 6.5 K/uL   Lymphocytes Relative 34 %   Lymphs Abs 1.7 1.0 - 3.6 K/uL   Monocytes Relative 14 %   Monocytes Absolute 0.7 0.2 - 1.0 K/uL   Eosinophils Relative 0 %   Eosinophils Absolute 0.0 0 - 0.7 K/uL   Basophils Relative 1 %   Basophils Absolute 0.0 0 - 0.1 K/uL    Blood Alcohol level:  Lab Results  Component Value Date   ETH <5 05/26/2016   ETH <5 04/26/2016    Metabolic Disorder Labs: Lab Results  Component Value Date   HGBA1C 5.6 05/28/2016   MPG 114 05/28/2016   No results found for: PROLACTIN Lab Results  Component Value Date   CHOL 139 05/28/2016   TRIG 84 05/28/2016   HDL 43 05/28/2016   CHOLHDL 3.2 05/28/2016   VLDL 17 05/28/2016   LDLCALC 79 05/28/2016   LDLCALC 74 11/08/2014    Physical Findings: AIMS: Facial and Oral Movements Muscles of Facial Expression: None, normal Lips and Perioral Area: None, normal Jaw: None, normal Tongue: None, normal,Extremity Movements Upper (arms, wrists, hands, fingers): Minimal Lower (legs, knees, ankles, toes): Minimal, Trunk Movements Neck, shoulders, hips: None, normal, Overall Severity Severity of abnormal movements (highest score from questions above): Minimal Incapacitation due to abnormal movements: None, normal Patient's awareness of abnormal movements (rate  only patient's report): No Awareness, Dental Status Current problems with teeth and/or dentures?: Yes (missing teeth) Does patient usually wear dentures?: No  CIWA:    COWS:     Musculoskeletal: Strength & Muscle Tone: within normal limits Gait & Station: normal Patient leans: N/A  Psychiatric Specialty Exam: Physical Exam  Nursing note and vitals reviewed. Constitutional: He is oriented to person, place, and time. He appears well-developed and well-nourished.  HENT:  Head: Normocephalic and atraumatic.  Eyes: Conjunctivae and EOM are normal.  Neck: Normal range of motion.  Respiratory: Effort normal.  Musculoskeletal: Normal range of motion.  Neurological: He is alert and oriented to person, place, and time.    Review of Systems  Constitutional: Negative.        Left upper extremity has been amputated. VEry poor hygeine.  HENT: Negative.   Eyes: Negative.   Respiratory: Negative.   Cardiovascular: Negative.   Gastrointestinal: Negative.   Genitourinary: Negative.   Musculoskeletal: Negative.   Skin: Negative.   Neurological: Negative.   Endo/Heme/Allergies: Negative.   Psychiatric/Behavioral: Negative.     Blood pressure 115/78, pulse 72, temperature 97.8 F (36.6 C), temperature source Oral, resp. rate 18, height 5\' 8"  (1.727 m), weight 80.3 kg (177 lb), SpO2 99 %.Body mass index is 26.91 kg/m.  General Appearance: Fairly Groomed  Eye Contact:  Good  Speech:  Garbled and Pressured  Volume:  Normal  Mood:  Irritable  Affect:  Constricted  Thought  Process:  Linear and Descriptions of Associations: Intact  Orientation:  Full (Time, Place, and Person)  Thought Content:  Logical  Suicidal Thoughts:  No  Homicidal Thoughts:  No  Memory:  Immediate;   Poor Recent;   Poor Remote;   Poor  Judgement:  Impaired  Insight:  Lacking  Psychomotor Activity:  Increased  Concentration:  Concentration: Poor and Attention Span: Poor  Recall:  Poor  Fund of Knowledge:  Poor   Language:  Poor  Akathisia:  No  Handed:  Right  AIMS (if indicated):     Assets:  Communication Skills Desire for Improvement Financial Resources/Insurance Physical Health Resilience  ADL's:  Intact  Cognition:  WNL  Sleep:  Number of Hours: 6.3     Treatment Plan Summary: Daily contact with patient to assess and evaluate symptoms and progress in treatment and Medication management   [name] is a 56 year old male with a history of schizophrenia admitted for psychotic break in the context of medication noncompliance and severe social stressors.  Schizophrenia:  continue Clozapine 100 mg in the morning 400 mg at night for psychosis and Depakote for irritability, 1000 mg every 12 hours. His last Depakote level on May 31 was 92.   Started prozac 20 mg for depression  Agitation: no longer agitated  Insomnia continue Restoril 30 mg daily at bedtime  Enuresis: Patient has been is started on desmopressin  Sialorrhea: Patient has been started on Atrovent sublingually at night  Prevention of metabolic syndrome: Continue metformin 500 mg twice a day  Dyslipidemia: Continue Zocor 40 mg by mouth daily  Constipation continue Senokot 2 tablets at bedtime, Colace 200 mg twice a day and MiraLAX daily  Smoking: Continue nicotine patch  Labs:Marland Kitchen Lipid panel and TSH are normal. HgbA1C 5.6. We restarted Metformin.   EKG. Normal sinus rhythm, QTc 427.  Social: The patient has a new legal guardian.   Disposition. We made referral to [facility]. He will follow up with ACT team.   PPD placed on 6/10  Referral has been made to [facility]. The patient is currently on their waiting list. In the meantime were also trying to prepare things for future placement in a group home.  2 group homes are coming to evaluate him this week.--- He has been accepted into a group home. Now the guardians trying to resolve the issue related to his disability check which is what is preventing  him from being able to be placed.  We hope to be able to discharge Next week. Currently were unable to discharge as there is no safe disposition   [name] Schlichter, MD 07/06/2016, 6:05 PM

## 2016-07-06 NOTE — [facility] Group Notes (Signed)
[facility] LCSW Group Therapy  07/06/2016 2:52 PM  Type of Therapy:  Group Therapy  Participation Level: Patient did not attend group. CSW invited patient to group.   Summary of Progress/Problems: Coping Skills: Patients defined and discussed healthy coping skills. Patients identified healthy coping skills they would like to try during hospitalization and after discharge. CSW offered insight to varying coping skills that may have been new to patients such as practicing mindfulness.  Candyce Gambino G. Garnette CzechSampson MSW, LCSWA 07/06/2016, 2:52 PM

## 2016-07-07 NOTE — Plan of Care (Signed)
Problem: Coping: Goal: Ability to verbalize frustrations and anger appropriately will improve Outcome: Progressing Patient requested that staff take the group outside this evening.  He was observed talking with peers and throwing basketball.  He took his medications without event.  He requested "that clear liquid"  to help with constipation but was given MOM for tonight.  He went to bed at 2300.

## 2016-07-07 NOTE — [facility] Group Notes (Signed)
[facility] Group Notes:  (Nursing/MHT/Case Management/Adjunct)  Date:  07/07/2016  Time:  3:29 AM  Type of Therapy:  Group Therapy  Participation Level:  Active  Participation Quality:  Appropriate  Affect:  Appropriate  Cognitive:  Appropriate  Insight:  Appropriate  Engagement in Group:  Engaged  Modes of Intervention:  Discussion  Summary of Progress/Problems:  [name] 07/07/2016, 3:29 AM

## 2016-07-07 NOTE — Plan of Care (Signed)
Problem: Safety: Goal: Ability to remain free from injury will improve Outcome: Progressing Patient remains safe from injury and remains on Q15 minute checks.

## 2016-07-07 NOTE — [facility] Group Notes (Signed)
[facility] LCSW Group Therapy  07/07/2016 2:24 PM  Type of Therapy:  Group Therapy  Participation Level:  Patient did not attend group. CSW invited patient to group.   Summary of Progress/Problems: Goal Setting: The objective is to set goals as they relate to the crisis in which they were admitted. Patients will be using SMART goal modalities to set measurable goals. Characteristics of realistic goals will be discussed and patients will be assisted in setting and processing how one will reach their goal. Facilitator will also assist patients in applying interventions and coping skills learned in psycho-education groups to the SMART goal and process how one will achieve defined goal.  Zhaire Locker G. Garnette CzechSampson MSW, LCSWA 07/07/2016, 2:24 PM

## 2016-07-07 NOTE — Progress Notes (Signed)
Patient was isolative to room and slept most of shift except for meals. Patient was medication compliant. Patient denies any SI/HI/AH/VH. Patient is alert and oriented x 4, breathing unlabored, and extremities x 4 within normal limits. Patient is calm and cooperative. Patient did not display any disruptive behavior. Will continue to monitor patient and notify MD of any changes.

## 2016-07-07 NOTE — Progress Notes (Signed)
[facility] MD Progress Note  07/07/2016 3:50 PM Jeffrey [name]  MRN:  [id number]  Subjective:  Jeffrey [name] is a 56 year old male with schizophrenia admitted floridly psychotic in the context of treatment noncompliance following his mother's death. He is on Clozapine and Depakote improving very slowly with terrible hygiene. He has a new guardian. Awaiting transfer to [facility] or placement if improved.   06/24/2016. Jeffrey [name] is up this morning and quite agitated. He received a dose of Ativan last night. Spoke with me 5 times already demanding discharge, call sheriff Wynetta Emery and change his guardian to a "white women from psychiatric center on [address]". His hygiene is terrible. Last week he slept most of the time. Last night he slept 4 hours only and has been up all morning. He accepts medications and repports no side effects. Appetite is fair. No program participation.  06/25/2016.Jeffrey [name] is up and running today. He is asking for discharge with family to "his house". He seems not to understand that he will be placed in a group home. He has lived in a group home before. He refuses to see his guardian and demands a meeting with a "red hair women from mental [facility]". Apparently this is a member of his ACT team. Unfortunatly the patient has racist tendencies. He takes medications. Sleep and appetite are fair. [facility] is unlikely to take him as they have many conditions for admission, like stable living situation. We will request case manager from his Medicaid provider and a meeting with the ACT team and the guardian.  06/26/2016. Jeffrey [name] is more animated today. Unfortunately he "his placement in a group home as he believes that he could sell his mother's house and live independently. He also refuses to go to a group home with black people. He takes medications. No side effects are reported. He took a shower and his hands. However his pants smell of urine.  06/27/2016. Jeffrey [name] is constipated today. He did not  respond to Mag Citrate. Fleet enema today. He denies any symptoms of depression, anxiety or psychosis. He is still talking to himself but closer to baseline. He is still delusional and refuses placement. He seems not to understand that he has a guardian now, wants to "sell his mother's house". Accepts medications. Hygiene is improving but refuses to wash cloths.  06/28/2016.Jeffrey [name] much better today. He has showered and changed his clothes. This is mostly due to severe diarrhea after he was given laxatives. At last, he was able to recognize the need for hygiene. He met with prospective group home owner yesterday. It did go well except for the fact that he was soiled. He takes medications and has no complaints. He is secluded to his room and skips meals. He did not eat lunch today. His thinking continues to improve and he is now able to hold a brief almost sensible conversation.  06/29/2016. Jeffrey [name] feels ready for discharge and is requesting a meetin with his guardian, forgetting that it is not a woman. Accepts medications and tolerates them well. Better hygiene. No behavioral problems.   06/30/2016. Jeffrey [name] is very anxious today believing that I am going to keep him in the hospital forever. He now wants to be dcharged to a group home as soon as possible. He seems at his baseline. We will contact Consepcion Hearing, group home owner, and his guardian to coordinate. He takes medications and tolerates them well. Much better hygiene.  6/11 patient continues to have very poor  hygiene and grooming, with a strong body odor. Patient was in his bed all covered with the sheets. He did not speak to me or uncover his head. He mumbled a few things but I was unable to understand him.  Per nurse's he is slept about 5 hours last night. There are no vital signs from today as he has not allowed the nurse to check his vitals. His blood pressure and heart rate from yesterday were within the normal limits. His oral intake is  good. He has compliant with all medications  6/12 withdrawn to his room. Lying in bed, appears that hygiene has improved, not body odor was detected today. Continues to comply with medications. He denied having any issues or concerns. Denies side effects from medications or physical complaints He did not get out of bed and talk to me while he was covered with sheets. He denies having any  "bad thoughts". He says he was a doctor his guardian.  6/13 patient was lying or engage in assessment today. Hygiene appears to have improved some. He still withdrawing. He is not but dissipated in any programming. He has minimal interactions with staff and no interactions with peers. He continues to be in bed all day. Denies having any problems. Denies any side effects from medications or any physical complaints. Basically his answers are monosyllabic yes or no.  6/14 patient seems a little bit better over the last 48 hours. He is less irritable. He engages slightly more in the assessment and he is less dismissive. He wants to be discharged. Looks like there are issues with his disability money and how to get those funds in order to pay for his group home. Hygiene has improved some. The patient continues to be compliant with meds. He refuses medications earlier this morning but was willing to take later on.  6/15 seems to be less withdrawn. He has been seen in the day room more often. Today he was sitting there watching him basketball team. He told me he was cheering for [place]. His hygiene and grooming are much improved. He has been compliant with all of his medications. He has been sleeping well at night. He has not been disruptive in the unit. No episodes of aggression or agitation. He denies side effects from medications, physical complaints, suicidality, homicidality or hallucinations.  07/07/15 The patient has been intermittently irritable. He stays fairly reclusive to his room but grooming has improved in  hygiene has improved. He does not attend any groups and interacts very little with peers. He denies any current active or passive suicidal thoughts or psychosis. He denies any auditory or visual hallucinations. At times he gets confused and cannot remember why he is in the hospital. Insight and judgment are poor with regards to discharge plans and the need to go to a group home. There've been no episodes of aggression or agitation. He is tolerating psychotropic medications fairly well. He denies any current severe depressive symptoms or suicidal thoughts. No homicidal thoughts. He denies any medical complaints. Vital signs are stable.   07/07/16 The patient did play basketball in the evening yesterday with peers. He has been reclusive to his room however otherwise and is not attending groups. The patient can be intermittently irritable upon approach but did speak to this Probation officer briefly. He denied any current active or passive suicidal thoughts and says his mood is "okay". He denied any auditory or visual hallucinations. He denies any homicidal thoughts. Insight and judgment with regards to discharge  plans remains limited. There have been no episodes of agitation or aggression on the unit and he is compliant with psychotropic medications. So far he denies any physical adverse side effects.    Principal Problem: Schizophrenia, undifferentiated ([place]) Diagnosis:   Patient Active Problem List   Diagnosis Date Noted  . Dyslipidemia [E78.5] 05/27/2016  . Constipation [K59.00] 05/27/2016  . Schizophrenia, undifferentiated ([place]) [F20.3] 05/27/2016  . Noncompliance [Z91.19] 02/15/2016  . Tobacco use disorder [F17.200] 06/30/2014  . Hypertension [I10] 06/29/2014   Total Time spent with patient: 20 minutes  Past Psychiatric History: schizophrenia.  Past Medical History:  Past Medical History:  Diagnosis Date  . Hypertension   . Schizophrenia [facility])     Past Surgical History:  Procedure Laterality Date   . 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 No pertinent family history. Family Psychiatric  History: none reported. Social History:  History  Alcohol Use No     History  Drug Use No    Social History   Social History  . Marital status: Single    Spouse name: N/A  . Number of children: N/A  . Years of education: N/A   Social History Main Topics  . Smoking status: Current Every Day Smoker    Packs/day: 1.00    Types: Cigarettes  . Smokeless tobacco: Current User  . Alcohol use No  . Drug use: No  . Sexual activity: Not Asked   Other Topics Concern  . None   Social History Narrative  . None   Additional Social History:    History of alcohol / drug use?: No history of alcohol / drug abuse      Current Medications: Current Facility-Administered Medications  Medication Dose Route Frequency Provider Last Rate Last Dose  . acetaminophen (TYLENOL) tablet 650 mg  650 mg Oral Q6H PRN Clapacs, John T, MD      . alum & mag hydroxide-simeth (MAALOX/MYLANTA) 200-200-20 MG/5ML suspension 30 mL  30 mL Oral Q4H PRN Clapacs, John T, MD      . cloZAPine (CLOZARIL) tablet 100 mg  100 mg Oral Daily Clapacs, Madie Reno, MD   100 mg at 07/07/16 0828  . cloZAPine (CLOZARIL) tablet 400 mg  400 mg Oral QHS Clapacs, Madie Reno, MD   400 mg at 07/06/16 2110  . desmopressin (DDAVP) tablet 0.2 mg  0.2 mg Oral QHS Pucilowska, [name], MD   0.2 mg at 07/06/16 2111  . divalproex (DEPAKOTE) DR tablet 1,000 mg  1,000 mg Oral Q12H Pucilowska, [name], MD   1,000 mg at 07/07/16 0829  . docusate sodium (COLACE) capsule 200 mg  200 mg Oral BID Pucilowska, [name], MD   200 mg at 07/07/16 0828  . FLUoxetine (PROZAC) capsule 10 mg  10 mg Oral Daily Hildred Priest, MD   10 mg at 07/07/16 0459  . ipratropium (ATROVENT) 0.03 % nasal spray 2 spray  2 spray Nasal QHS Hildred Priest, MD   2 spray at 07/06/16 2108  . magnesium  hydroxide (MILK OF MAGNESIA) suspension 30 mL  30 mL Oral Daily PRN Clapacs, Madie Reno, MD   30 mL at 07/06/16 2107  . metFORMIN (GLUCOPHAGE) tablet 500 mg  500 mg Oral BID WC Pucilowska, [name], MD   500 mg at 07/07/16 0829  . metoprolol tartrate (LOPRESSOR) tablet 12.5 mg  12.5 mg Oral BID Hildred Priest, MD   12.5 mg at 07/07/16 0828  .  polyethylene glycol (MIRALAX / GLYCOLAX) packet 17 g  17 g Oral Daily Pucilowska, [name], MD   17 g at 07/07/16 0829  . senna (SENOKOT) tablet 17.2 mg  2 tablet Oral QHS Clapacs, John T, MD   17.2 mg at 07/06/16 2109  . simvastatin (ZOCOR) tablet 40 mg  40 mg Oral QHS Clapacs, Madie Reno, MD   40 mg at 07/06/16 2110  . temazepam (RESTORIL) capsule 30 mg  30 mg Oral QHS Pucilowska, [name], MD   30 mg at 07/06/16 2214    Lab Results:  No results found for this or any previous visit (from the past 48 hour(s)).  Blood Alcohol level:  Lab Results  Component Value Date   ETH <5 05/26/2016   ETH <5 00/76/2263    Metabolic Disorder Labs: Lab Results  Component Value Date   HGBA1C 5.6 05/28/2016   MPG 114 05/28/2016   No results found for: PROLACTIN Lab Results  Component Value Date   CHOL 139 05/28/2016   TRIG 84 05/28/2016   HDL 43 05/28/2016   CHOLHDL 3.2 05/28/2016   VLDL 17 05/28/2016   LDLCALC 79 05/28/2016   LDLCALC 74 11/08/2014    Physical Findings: AIMS: Facial and Oral Movements Muscles of Facial Expression: None, normal Lips and Perioral Area: None, normal Jaw: None, normal Tongue: None, normal,Extremity Movements Upper (arms, wrists, hands, fingers): Minimal Lower (legs, knees, ankles, toes): Minimal, Trunk Movements Neck, shoulders, hips: None, normal, Overall Severity Severity of abnormal movements (highest score from questions above): Minimal Incapacitation due to abnormal movements: None, normal Patient's awareness of abnormal movements (rate only patient's report): No Awareness, Dental Status Current problems  with teeth and/or dentures?: Yes (missing teeth) Does patient usually wear dentures?: No   Musculoskeletal: Strength & Muscle Tone: within normal limits Gait & Station: normal Patient leans: N/A  Psychiatric Specialty Exam: Physical Exam  Nursing note and vitals reviewed. Constitutional: He is oriented to person, place, and time. He appears well-developed and well-nourished.  HENT:  Head: Normocephalic and atraumatic.  Eyes: Conjunctivae and EOM are normal.  Neck: Normal range of motion.  Respiratory: Effort normal.  Musculoskeletal: Normal range of motion.  Neurological: He is alert and oriented to person, place, and time.    Review of Systems  Constitutional: Negative.        Left upper extremity has been amputated. VEry poor hygeine.  HENT: Negative.   Eyes: Negative.   Respiratory: Negative.   Cardiovascular: Negative.   Gastrointestinal: Negative.   Genitourinary: Negative.   Musculoskeletal: Negative.   Skin: Negative.   Neurological: Negative.   Endo/Heme/Allergies: Negative.   Psychiatric/Behavioral: Negative.     Blood pressure 115/78, pulse 72, temperature 97.8 F (36.6 C), temperature source Oral, resp. rate 18, height 5' 8" (1.727 m), weight 80.3 kg (177 lb), SpO2 99 %.Body mass index is 26.91 kg/m.  General Appearance: Fairly Groomed  Eye Contact:  Good  Speech:  Garbled and Pressured  Volume:  Normal  Mood:  Irritable  Affect:  Constricted  Thought Process:  Linear and Descriptions of Associations: Intact  Orientation:  Full (Time, Place, and Person)  Thought Content:  Logical  Suicidal Thoughts:  No  Homicidal Thoughts:  No  Memory:  Immediate;   Poor Recent;   Poor Remote;   Poor  Judgement:  Impaired  Insight:  Lacking  Psychomotor Activity:  Increased  Concentration:  Concentration: Poor and Attention Span: Poor  Recall:  Poor  Fund of Knowledge:  Poor  Language:  Poor  Akathisia:  No  Handed:  Right  AIMS (if indicated):     Assets:   Communication Skills Desire for Improvement Financial Resources/Insurance Physical Health Resilience  ADL's:  Intact  Cognition:  WNL  Sleep:  Number of Hours: 7.3     Treatment Plan Summary: Daily contact with patient to assess and evaluate symptoms and progress in treatment and Medication management   Mr. Heinecke is a 56 year old male with a history of schizophrenia admitted for psychotic break in the context of medication noncompliance and severe social stressors.  Schizophrenia:  continue Clozapine 100 mg in the morning 400 mg at night for psychosis and Depakote for irritability, 1000 mg every 12 hours. His last Depakote level on May 31 was 92.   Started prozac 20 mg for depression  Agitation: no longer agitated  Insomnia continue Restoril 30 mg daily at bedtime  Enuresis: Patient has been is started on desmopressin  Sialorrhea: Patient has been started on Atrovent sublingually at night  Prevention of metabolic syndrome: Continue metformin 500 mg twice a day  Dyslipidemia: Continue Zocor 40 mg by mouth daily  Constipation continue Senokot 2 tablets at bedtime, Colace 200 mg twice a day and MiraLAX daily  Smoking: Continue nicotine patch  Labs:Marland Kitchen Lipid panel and TSH are normal. HgbA1C 5.6. We restarted Metformin.   EKG. Normal sinus rhythm, QTc 427.  Social: The patient has a new legal guardian.   Disposition. We made referral to [facility]. He will follow up with ACT team.   PPD placed on 6/10  Referral has been made to [facility]. The patient is currently on their waiting list. In the meantime were also trying to prepare things for future placement in a group home.  2 group homes are coming to evaluate him this week.--- He has been accepted into a group home. Now the guardians trying to resolve the issue related to his disability check which is what is preventing him from being able to be placed.  We hope to be able to discharge Next week.  Currently were unable to discharge as there is no safe disposition   [name] Schlichter, MD 07/07/2016, 3:50 PM

## 2016-07-08 MED ORDER — TEMAZEPAM 15 MG PO CAPS: 15.0000 mg | ORAL_CAPSULE | Freq: Every day | ORAL | 1 refills | Status: DC

## 2016-07-08 MED ORDER — DIVALPROEX SODIUM 500 MG PO DR TAB: 1000.0000 mg | DELAYED_RELEASE_TABLET | Freq: Two times a day (BID) | ORAL | 1 refills | Status: DC

## 2016-07-08 MED ORDER — CLOZAPINE 100 MG PO TABS: 100.0000 mg | ORAL_TABLET | Freq: Two times a day (BID) | ORAL | 1 refills | Status: DC

## 2016-07-08 MED ORDER — TEMAZEPAM 15 MG PO CAPS: 15.0000 mg | ORAL_CAPSULE | Freq: Every day | ORAL | Status: DC

## 2016-07-08 MED ORDER — METOPROLOL TARTRATE 25 MG PO TABS: 12.5000 mg | ORAL_TABLET | Freq: Two times a day (BID) | ORAL | 1 refills | Status: DC

## 2016-07-08 MED ORDER — FLUOXETINE HCL 10 MG PO CAPS: 10.0000 mg | ORAL_CAPSULE | Freq: Every day | ORAL | 1 refills | Status: DC

## 2016-07-08 MED ORDER — LORAZEPAM 2 MG PO TABS: 2.0000 mg | ORAL_TABLET | Freq: Every evening | ORAL | Status: DC | PRN

## 2016-07-08 MED ORDER — SENNA 8.6 MG PO TABS: 2.0000 | ORAL_TABLET | Freq: Every day | ORAL | 1 refills | Status: DC

## 2016-07-08 MED ORDER — POLYETHYLENE GLYCOL 3350 17 G PO PACK: 17.0000 g | PACK | Freq: Every day | ORAL | 1 refills | Status: DC

## 2016-07-08 MED ORDER — SIMVASTATIN 40 MG PO TABS: 40.0000 mg | ORAL_TABLET | Freq: Every day | ORAL | 1 refills | Status: DC

## 2016-07-08 MED ORDER — METFORMIN HCL 500 MG PO TABS: 500.0000 mg | ORAL_TABLET | Freq: Two times a day (BID) | ORAL | 1 refills | Status: DC

## 2016-07-08 MED ORDER — DOCUSATE SODIUM 100 MG PO CAPS: 200.0000 mg | ORAL_CAPSULE | Freq: Two times a day (BID) | ORAL | 1 refills | Status: DC

## 2016-07-08 MED ORDER — TRAZODONE HCL 100 MG PO TABS
300.0000 mg | ORAL_TABLET | Freq: Every day | ORAL | Status: DC
  Administered 2016-07-08: 300 mg via ORAL
  Filled 2016-07-08: qty 3

## 2016-07-08 MED ORDER — DESMOPRESSIN ACETATE 0.2 MG PO TABS: 0.2000 mg | ORAL_TABLET | Freq: Every day | ORAL | 1 refills | Status: DC

## 2016-07-08 MED ORDER — IPRATROPIUM BROMIDE 0.03 % NA SOLN: 2.0000 | Freq: Every day | NASAL | 12 refills | Status: DC

## 2016-07-08 NOTE — Progress Notes (Signed)
[facility] MD Progress Note  07/08/2016 1:39 PM GERRON GUIDOTTI  MRN:  [id number]  Subjective:  Mr. Duquette is a 56 year old male with schizophrenia admitted floridly psychotic in the context of treatment noncompliance following his mother's death. He is on Clozapine and Depakote improving very slowly with terrible hygiene. He has a new guardian. Awaiting transfer to [facility] or placement if improved.   6/15 seems to be less withdrawn. He has been seen in the day room more often. Today he was sitting there watching him basketball team. He told me he was cheering for [place]. His hygiene and grooming are much improved. He has been compliant with all of his medications. He has been sleeping well at night. He has not been disruptive in the unit. No episodes of aggression or agitation. He denies side effects from medications, physical complaints, suicidality, homicidality or hallucinations.  07/06/16. The patient has been intermittently irritable. He stays fairly reclusive to his room but grooming has improved in hygiene has improved. He does not attend any groups and interacts very little with peers. He denies any current active or passive suicidal thoughts or psychosis. He denies any auditory or visual hallucinations. At times he gets confused and cannot remember why he is in the hospital. Insight and judgment are poor with regards to discharge plans and the need to go to a group home. There've been no episodes of aggression or agitation. He is tolerating psychotropic medications fairly well. He denies any current severe depressive symptoms or suicidal thoughts. No homicidal thoughts. He denies any medical complaints. Vital signs are stable.   07/07/16. The patient did play basketball in the evening yesterday with peers. He has been reclusive to his room however otherwise and is not attending groups. The patient can be intermittently irritable upon approach but did speak to this Clinical research associate briefly. He denied any current  active or passive suicidal thoughts and says his mood is "okay". He denied any auditory or visual hallucinations. He denies any homicidal thoughts. Insight and judgment with regards to discharge plans remains limited. There have been no episodes of agitation or aggression on the unit and he is compliant with psychotropic medications. So far he denies any physical adverse side effects.   6/18/208. Mr. Hennon is asking about discharge. He agrees to be discharged to a group home tomorrow as long as this is "a white woman" to pick him up. He has no complaints. He accepts medications and tolerates them well. He seems at his baseline. No behavioral problems. He is visible in the community.  Per nursing: Goal: Ability to cope will improve Outcome: Progressing Patient spent time outside with group and played basketball.  He stayed in the dayroom until it was closed. He showered before bedtime and asked staff to assist with changing bed linens.   He was hungry after midnight and requested snacks "because I missed lunch and dinner".   Principal Problem: Schizophrenia, undifferentiated (HCC) Diagnosis:   Patient Active Problem List   Diagnosis Date Noted  . Dyslipidemia [E78.5] 05/27/2016  . Constipation [K59.00] 05/27/2016  . Schizophrenia, undifferentiated (HCC) [F20.3] 05/27/2016  . Noncompliance [Z91.19] 02/15/2016  . Tobacco use disorder [F17.200] 06/30/2014  . Hypertension [I10] 06/29/2014   Total Time spent with patient: 20 minutes  Past Psychiatric History: schizophrenia.  Past Medical History:  Past Medical History:  Diagnosis Date  . Hypertension   . Schizophrenia [facility])     Past Surgical History:  Procedure Laterality Date  . 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 No pertinent family history. Family Psychiatric  History: none reported. Social History:  History  Alcohol Use No     History  Drug Use No    Social  History   Social History  . Marital status: Single    Spouse name: N/A  . Number of children: N/A  . Years of education: N/A   Social History Main Topics  . Smoking status: Current Every Day Smoker    Packs/day: 1.00    Types: Cigarettes  . Smokeless tobacco: Current User  . Alcohol use No  . Drug use: No  . Sexual activity: Not Asked   Other Topics Concern  . None   Social History Narrative  . None   Additional Social History:    History of alcohol / drug use?: No history of alcohol / drug abuse      Current Medications: Current Facility-Administered Medications  Medication Dose Route Frequency Provider Last Rate Last Dose  . acetaminophen (TYLENOL) tablet 650 mg  650 mg Oral Q6H PRN Clapacs, John T, MD      . alum & mag hydroxide-simeth (MAALOX/MYLANTA) 200-200-20 MG/5ML suspension 30 mL  30 mL Oral Q4H PRN Clapacs, John T, MD      . cloZAPine (CLOZARIL) tablet 100 mg  100 mg Oral Daily Clapacs, [name], MD   100 mg at 07/08/16 0844  . cloZAPine (CLOZARIL) tablet 400 mg  400 mg Oral QHS Clapacs, John T, MD   400 mg at 07/07/16 2130  . desmopressin (DDAVP) tablet 0.2 mg  0.2 mg Oral QHS Danea Manter B, MD   0.2 mg at 07/07/16 2130  . divalproex (DEPAKOTE) DR tablet 1,000 mg  1,000 mg Oral Q12H Porschea Borys B, MD   1,000 mg at 07/08/16 0845  . docusate sodium (COLACE) capsule 200 mg  200 mg Oral BID Noga Fogg B, MD   200 mg at 07/08/16 0845  . FLUoxetine (PROZAC) capsule 10 mg  10 mg Oral Daily Jimmy Footman, MD   10 mg at 07/08/16 0845  . ipratropium (ATROVENT) 0.03 % nasal spray 2 spray  2 spray Nasal QHS Jimmy Footman, MD   2 spray at 07/07/16 2131  . magnesium hydroxide (MILK OF MAGNESIA) suspension 30 mL  30 mL Oral Daily PRN Clapacs, [name], MD   30 mL at 07/06/16 2107  . metFORMIN (GLUCOPHAGE) tablet 500 mg  500 mg Oral BID WC Kinzly Pierrelouis B, MD   500 mg at 07/08/16 0845  . metoprolol tartrate (LOPRESSOR) tablet  12.5 mg  12.5 mg Oral BID Jimmy Footman, MD   12.5 mg at 07/08/16 0844  . polyethylene glycol (MIRALAX / GLYCOLAX) packet 17 g  17 g Oral Daily Bridger Pizzi B, MD   17 g at 07/08/16 0844  . senna (SENOKOT) tablet 17.2 mg  2 tablet Oral QHS Clapacs, John T, MD   17.2 mg at 07/07/16 2130  . simvastatin (ZOCOR) tablet 40 mg  40 mg Oral QHS Clapacs, [name], MD   40 mg at 07/07/16 2130  . temazepam (RESTORIL) capsule 15 mg  15 mg Oral QHS Cabe Lashley B, MD        Lab Results:  No results found for this or any previous visit (from the past 48 hour(s)).  Blood Alcohol level:  Lab Results  Component Value Date   [facility] <5 05/26/2016   ETH <5 04/26/2016    Metabolic  Disorder Labs: Lab Results  Component Value Date   HGBA1C 5.6 05/28/2016   MPG 114 05/28/2016   No results found for: PROLACTIN Lab Results  Component Value Date   CHOL 139 05/28/2016   TRIG 84 05/28/2016   HDL 43 05/28/2016   CHOLHDL 3.2 05/28/2016   VLDL 17 05/28/2016   LDLCALC 79 05/28/2016   LDLCALC 74 11/08/2014    Physical Findings: AIMS: Facial and Oral Movements Muscles of Facial Expression: None, normal Lips and Perioral Area: None, normal Jaw: None, normal Tongue: None, normal,Extremity Movements Upper (arms, wrists, hands, fingers): Minimal Lower (legs, knees, ankles, toes): Minimal, Trunk Movements Neck, shoulders, hips: None, normal, Overall Severity Severity of abnormal movements (highest score from questions above): Minimal Incapacitation due to abnormal movements: None, normal Patient's awareness of abnormal movements (rate only patient's report): No Awareness, Dental Status Current problems with teeth and/or dentures?: Yes (missing teeth) Does patient usually wear dentures?: No   Musculoskeletal: Strength & Muscle Tone: within normal limits Gait & Station: normal Patient leans: N/A  Psychiatric Specialty Exam: Physical Exam  Nursing note and vitals  reviewed. Psychiatric: His speech is normal. His affect is blunt. He is withdrawn. Thought content is paranoid and delusional. Cognition and memory are normal. He expresses impulsivity.    Review of Systems  Constitutional:       Left upper extremity has been amputated. VEry poor hygeine.  All other systems reviewed and are negative.   Blood pressure 121/73, pulse 74, temperature 98 F (36.7 C), temperature source Oral, resp. rate 18, height 5\' 8"  (1.727 m), weight 80.3 kg (177 lb), SpO2 99 %.Body mass index is 26.91 kg/m.  General Appearance: Fairly Groomed  Eye Contact:  Good  Speech:  Garbled and Pressured  Volume:  Normal  Mood:  Irritable  Affect:  Constricted  Thought Process:  Linear and Descriptions of Associations: Intact  Orientation:  Full (Time, Place, and Person)  Thought Content:  Logical  Suicidal Thoughts:  No  Homicidal Thoughts:  No  Memory:  Immediate;   Poor Recent;   Poor Remote;   Poor  Judgement:  Impaired  Insight:  Lacking  Psychomotor Activity:  Increased  Concentration:  Concentration: Poor and Attention Span: Poor  Recall:  Poor  Fund of Knowledge:  Poor  Language:  Poor  Akathisia:  No  Handed:  Right  AIMS (if indicated):     Assets:  Communication Skills Desire for Improvement Financial Resources/Insurance Physical Health Resilience  ADL's:  Intact  Cognition:  WNL  Sleep:  Number of Hours: 1.3     Treatment Plan Summary: Daily contact with patient to assess and evaluate symptoms and progress in treatment and Medication management   [name] is a 56 year old male with a history of schizophrenia admitted for psychotic break in the context of medication noncompliance and severe social stressors.  1. Schizophrenia. We continue Clozapine for psychosis and Depakote for mood stabilization. VPA level 92. He is also on Metformin for metabolic syndrome prevention.   2. Depression. We started prozac.  3. Insomnia. Improved on  Restoril.  4. Enuresis. Improved on desmopressin.   5. Sialorrhea. The patient has been started on Atrovent sublingually at night.  6. Dyslipidemia. We continued Zocor.  7. Constipation. He is on bowel regimen.   8. Smoking. Niicotine patch is available.  9. Metabolic syndrome monitoring. Lipid panel and TSH are normal. HgbA1C 5.6.   10. EKG. Normal sinus rhythm, QTc 427.  11. Social. The patient has a new  legal guardian.   12. He will be discharged to a group home with his guardian approval. He will follow up with ACT team.    Kristine Linea, MD 07/08/2016, 1:39 PM

## 2016-07-08 NOTE — NC FL2 (Signed)
[place] MEDICAID FL2 LEVEL OF CARE SCREENING TOOL     IDENTIFICATION  Patient Name: Jeffrey [name] Birthdate: 1960/09/11 Sex: male Admission Date (Current Location): 05/27/2016  [place]  [place] [id number] [facility] Facility [facility] Address[facility]  [facility], [address], [place], [place] [address]      Provider Number: [id number]  Attending Physician Name and Address:  Shari ProwsPucilowska, [name], MD  Relative Name and Phone Number:  Zack SealRonnie Hardie, legal guardian.  [phone]    Current Level of Care: Hospital Recommended Level of Care: Family Care Home Prior Approval Number:    Date Approved/Denied:   PASRR Number:    Discharge Plan: Other (Comment) (family care home)    Current Diagnoses: Patient Active Problem List   Diagnosis Date Noted  . Dyslipidemia 05/27/2016  . Constipation 05/27/2016  . Schizophrenia, undifferentiated (HCC) 05/27/2016  . Noncompliance 02/15/2016  . Tobacco use disorder 06/30/2014  . Hypertension 06/29/2014    Orientation RESPIRATION BLADDER Height & Weight     Self, Place  Normal Incontinent Weight: 177 lb (80.3 kg) Height:  5\' 8"  (172.7 cm)  BEHAVIORAL SYMPTOMS/MOOD NEUROLOGICAL BOWEL NUTRITION STATUS  Other (Comment) (has had altercations with others)  (na) Continent  (na)  AMBULATORY STATUS COMMUNICATION OF NEEDS Skin   Independent Verbally Normal                       Personal Care Assistance Level of Assistance   (na)           Functional Limitations Info   (na)          SPECIAL CARE FACTORS FREQUENCY   (na)                    Contractures Contractures Info: Not present    Additional Factors Info   (na)               Current Medications (07/08/2016):  This is the current hospital active medication list Current Facility-Administered Medications  Medication Dose Route Frequency Provider Last Rate Last Dose  . acetaminophen (TYLENOL) tablet 650 mg  650 mg Oral Q6H  PRN Clapacs, John T, MD      . alum & mag hydroxide-simeth (MAALOX/MYLANTA) 200-200-20 MG/5ML suspension 30 mL  30 mL Oral Q4H PRN Clapacs, John T, MD      . cloZAPine (CLOZARIL) tablet 100 mg  100 mg Oral Daily Clapacs, Jackquline DenmarkJohn T, MD   100 mg at 07/08/16 0844  . cloZAPine (CLOZARIL) tablet 400 mg  400 mg Oral QHS Clapacs, John T, MD   400 mg at 07/07/16 2130  . desmopressin (DDAVP) tablet 0.2 mg  0.2 mg Oral QHS Pucilowska, [name], MD   0.2 mg at 07/07/16 2130  . divalproex (DEPAKOTE) DR tablet 1,000 mg  1,000 mg Oral Q12H Pucilowska, [name], MD   1,000 mg at 07/08/16 0845  . docusate sodium (COLACE) capsule 200 mg  200 mg Oral BID Pucilowska, [name], MD   200 mg at 07/08/16 0845  . FLUoxetine (PROZAC) capsule 10 mg  10 mg Oral Daily Jimmy FootmanHernandez-Gonzalez, Andrea, MD   10 mg at 07/08/16 0845  . ipratropium (ATROVENT) 0.03 % nasal spray 2 spray  2 spray Nasal QHS Jimmy FootmanHernandez-Gonzalez, Andrea, MD   2 spray at 07/07/16 2131  . magnesium hydroxide (MILK OF MAGNESIA) suspension 30 mL  30 mL Oral Daily PRN Clapacs, Jackquline DenmarkJohn T, MD   30 mL at 07/06/16 2107  .  metFORMIN (GLUCOPHAGE) tablet 500 mg  500 mg Oral BID WC Pucilowska, [name], MD   500 mg at 07/08/16 0845  . metoprolol tartrate (LOPRESSOR) tablet 12.5 mg  12.5 mg Oral BID Jimmy Footman, MD   12.5 mg at 07/08/16 0844  . polyethylene glycol (MIRALAX / GLYCOLAX) packet 17 g  17 g Oral Daily Pucilowska, [name], MD   17 g at 07/08/16 0844  . senna (SENOKOT) tablet 17.2 mg  2 tablet Oral QHS Clapacs, John T, MD   17.2 mg at 07/07/16 2130  . simvastatin (ZOCOR) tablet 40 mg  40 mg Oral QHS Clapacs, [name], MD   40 mg at 07/07/16 2130  . temazepam (RESTORIL) capsule 15 mg  15 mg Oral QHS Pucilowska, [name], MD         Discharge Medications: Please see discharge summary for a list of discharge medications.  Relevant Imaging Results:  Relevant Lab Results:   Additional Information None  Lorri Frederick, LCSW

## 2016-07-08 NOTE — [facility] Group Notes (Signed)
[facility] Group Notes:  (Nursing/MHT/Case Management/Adjunct)  Date:  07/08/2016  Time:  12:31 AM  Type of Therapy:  Evening Wrap-up Group  Participation Level:  Active  Participation Quality:  Appropriate and Attentive  Affect:  Appropriate  Cognitive:  Alert and Appropriate  Insight:  Appropriate and Good  Engagement in Group:  Developing/Improving  Modes of Intervention:  Activity and Discussion  Summary of Progress/Problems:  Jeffrey MorrowChelsea [name] Jeffrey [name] 07/08/2016, 12:31 AM

## 2016-07-08 NOTE — [facility] Group Notes (Signed)
[facility] LCSW Group Therapy Note  Date/Time: 07/08/16, 1300  Type of Therapy and Topic:  Group Therapy:  Overcoming Obstacles  Participation Level:  active  Description of Group:    In this group patients will be encouraged to explore what they see as obstacles to their own wellness and recovery. They will be guided to discuss their thoughts, feelings, and behaviors related to these obstacles. The group will process together ways to cope with barriers, with attention given to specific choices patients can make. Each patient will be challenged to identify changes they are motivated to make in order to overcome their obstacles. This group will be process-oriented, with patients participating in exploration of their own experiences as well as giving and receiving support and challenge from other group members.  Therapeutic Goals: 1. Patient will identify personal and current obstacles as they relate to admission. 2. Patient will identify barriers that currently interfere with their wellness or overcoming obstacles.  3. Patient will identify feelings, thought process and behaviors related to these barriers. 4. Patient will identify two changes they are willing to make to overcome these obstacles:    Summary of Patient Progress: Pt is unable to focus on the group topic.  He does interact with other group members and listens to what they are sharing.  Pt often raises his hand and asks a completely unrelated question.  CSW suggested to pt that housing was one obstacle in his life and he agreed with this, but was unable to discuss it further.      Therapeutic Modalities:   Cognitive Behavioral Therapy Solution Focused Therapy Motivational Interviewing Relapse Prevention Therapy  Daleen SquibbGreg Ladarren Steiner, LCSW

## 2016-07-08 NOTE — Plan of Care (Signed)
Problem: Coping: Goal: Ability to cope will improve Outcome: Progressing Patient spent time outside with group and played basketball.  He stayed in the dayroom until it was closed. He showered before bedtime and asked staff to assist with changing bed linens.   He was hungry after midnight and requested snacks "because I missed lunch and dinner".

## 2016-07-08 NOTE — Progress Notes (Signed)
   07/03/16 0418  PPD Results  Does patient have an induration at the injection site? No

## 2016-07-08 NOTE — Progress Notes (Signed)
Recreation Therapy Notes  Date: 06.18.18 Time: 9:30 am Location: Craft Room  Group Topic: Self-expression  Goal Area(s) Addresses:  Patient will effectively use art as a means of self-expression. Patient will recognize positive benefit of self-expression. Patient will be able to identify one emotion experienced during group session. Patient will identify use of art as a coping skill.  Behavioral Response: Arrived late  Intervention: Two Faces of Me  Activity: Patients were given a blank face worksheet and were instructed to draw a line down the middle. On one side, patients were instructed to draw or write how they felt when they were admitted to the hospital and on the other side they were instructed to draw or write how they wanted to feel when they are d/c.  Education: LRT educated patients on other forms of self-expression.  Education Outcome: Patient came to group when group ended.   Clinical Observations/Feedback: Patient came to group when group had ended.  Jacquelynn CreeGreene,Martel Galvan M, LRT/CTRS 07/08/2016 10:10 AM

## 2016-07-08 NOTE — Progress Notes (Signed)
Patient up more today.  Has spent majority of time in dayroom today.  No interaction noted with peers.   Medication compliant.  Support offered.  Safety maintained.

## 2016-07-08 NOTE — Tx Team (Signed)
Interdisciplinary Treatment and Diagnostic Plan Update  07/08/2016 Time of Session: 1110 Jeffrey [name] MRN: [id number]  Principal Diagnosis: Schizophrenia, undifferentiated (HCC)  Secondary Diagnoses: Principal Problem:   Schizophrenia, undifferentiated (HCC) Active Problems:   Hypertension   Tobacco use disorder   Noncompliance   Dyslipidemia   Constipation   Current Medications:  Current Facility-Administered Medications  Medication Dose Route Frequency Provider Last Rate Last Dose  . acetaminophen (TYLENOL) tablet 650 mg  650 mg Oral Q6H PRN Clapacs, John T, MD      . alum & mag hydroxide-simeth (MAALOX/MYLANTA) 200-200-20 MG/5ML suspension 30 mL  30 mL Oral Q4H PRN Clapacs, John T, MD      . cloZAPine (CLOZARIL) tablet 100 mg  100 mg Oral Daily Clapacs, [name], MD   100 mg at 07/08/16 0844  . cloZAPine (CLOZARIL) tablet 400 mg  400 mg Oral QHS Clapacs, John T, MD   400 mg at 07/07/16 2130  . desmopressin (DDAVP) tablet 0.2 mg  0.2 mg Oral QHS Pucilowska, [name], MD   0.2 mg at 07/07/16 2130  . divalproex (DEPAKOTE) DR tablet 1,000 mg  1,000 mg Oral Q12H Pucilowska, [name], MD   1,000 mg at 07/08/16 0845  . docusate sodium (COLACE) capsule 200 mg  200 mg Oral BID Pucilowska, [name], MD   200 mg at 07/08/16 0845  . FLUoxetine (PROZAC) capsule 10 mg  10 mg Oral Daily Jimmy Footman, MD   10 mg at 07/08/16 0845  . ipratropium (ATROVENT) 0.03 % nasal spray 2 spray  2 spray Nasal QHS Jimmy Footman, MD   2 spray at 07/07/16 2131  . magnesium hydroxide (MILK OF MAGNESIA) suspension 30 mL  30 mL Oral Daily PRN Clapacs, [name], MD   30 mL at 07/06/16 2107  . metFORMIN (GLUCOPHAGE) tablet 500 mg  500 mg Oral BID WC Pucilowska, [name], MD   500 mg at 07/08/16 0845  . metoprolol tartrate (LOPRESSOR) tablet 12.5 mg  12.5 mg Oral BID Jimmy Footman, MD   12.5 mg at 07/08/16 0844  . polyethylene glycol (MIRALAX / GLYCOLAX) packet 17 g  17 g Oral  Daily Pucilowska, [name], MD   17 g at 07/08/16 0844  . senna (SENOKOT) tablet 17.2 mg  2 tablet Oral QHS Clapacs, John T, MD   17.2 mg at 07/07/16 2130  . simvastatin (ZOCOR) tablet 40 mg  40 mg Oral QHS Clapacs, [name], MD   40 mg at 07/07/16 2130  . temazepam (RESTORIL) capsule 15 mg  15 mg Oral QHS Pucilowska, [name], MD       PTA Medications: Prescriptions Prior to Admission  Medication Sig Dispense Refill Last Dose  . cloZAPine (CLOZARIL) 100 MG tablet Take 1-4 tablets (100-400 mg total) by mouth 2 (two) times daily. Patient takes 1 tablet (100 mg) in the morning and 4 tablets (400 mg) at bedtime. (Patient not taking: Reported on 04/26/2016) 150 tablet 0 Not Taking at Unknown time  . paliperidone (INVEGA) 6 MG 24 hr tablet Take 6 mg by mouth 2 (two) times daily.   Not Taking at Unknown time  . senna (SENOKOT) 8.6 MG TABS tablet Take 2 tablets (17.2 mg total) by mouth at bedtime. (Patient not taking: Reported on 04/26/2016) 120 each 0 Not Taking at Unknown time  . simvastatin (ZOCOR) 40 MG tablet Take 40 mg by mouth at bedtime.   Not Taking at Unknown time  . traZODone (DESYREL) 100 MG tablet Take 200 mg  by mouth at bedtime.   Not Taking at Unknown time    Patient Stressors: Marital or family conflict Medication change or noncompliance Traumatic event  Patient Strengths: Astronomer fund of knowledge Physical Health  Treatment Modalities: Medication Management, Group therapy, Case management,  1 to 1 session with clinician, Psychoeducation, Recreational therapy.   Physician Treatment Plan for Primary Diagnosis: Schizophrenia, undifferentiated (HCC) Long Term Goal(s): Improvement in symptoms so as ready for discharge NA   Short Term Goals: Ability to identify changes in lifestyle to reduce recurrence of condition will improve Ability to verbalize feelings will improve Ability to disclose and discuss suicidal ideas Ability to demonstrate self-control will  improve Ability to identify and develop effective coping behaviors will improve Compliance with prescribed medications will improve Ability to identify triggers associated with substance abuse/mental health issues will improve NA  Medication Management: Evaluate patient's response, side effects, and tolerance of medication regimen.  Therapeutic Interventions: 1 to 1 sessions, Unit Group sessions and Medication administration.  Evaluation of Outcomes: Adequate for Discharge  Physician Treatment Plan for Secondary Diagnosis: Principal Problem:   Schizophrenia, undifferentiated (HCC) Active Problems:   Hypertension   Tobacco use disorder   Noncompliance   Dyslipidemia   Constipation  Long Term Goal(s): Improvement in symptoms so as ready for discharge NA   Short Term Goals: Ability to identify changes in lifestyle to reduce recurrence of condition will improve Ability to verbalize feelings will improve Ability to disclose and discuss suicidal ideas Ability to demonstrate self-control will improve Ability to identify and develop effective coping behaviors will improve Compliance with prescribed medications will improve Ability to identify triggers associated with substance abuse/mental health issues will improve NA     Medication Management: Evaluate patient's response, side effects, and tolerance of medication regimen.  Therapeutic Interventions: 1 to 1 sessions, Unit Group sessions and Medication administration.  Evaluation of Outcomes: Adequate for Discharge   RN Treatment Plan for Primary Diagnosis: Schizophrenia, undifferentiated (HCC) Long Term Goal(s): Knowledge of disease and therapeutic regimen to maintain health will improve  Short Term Goals: Ability to remain free from injury will improve, Ability to verbalize frustration and anger appropriately will improve, Ability to demonstrate self-control, Ability to participate in decision making will improve and Ability to  verbalize feelings will improve  Medication Management: RN will administer medications as ordered by provider, will assess and evaluate patient's response and provide education to patient for prescribed medication. RN will report any adverse and/or side effects to prescribing provider.  Therapeutic Interventions: 1 on 1 counseling sessions, Psychoeducation, Medication administration, Evaluate responses to treatment, Monitor vital signs and CBGs as ordered, Perform/monitor CIWA, COWS, AIMS and Fall Risk screenings as ordered, Perform wound care treatments as ordered.  Evaluation of Outcomes: Adequate for Discharge   LCSW Treatment Plan for Primary Diagnosis: Schizophrenia, undifferentiated (HCC) Long Term Goal(s): Safe transition to appropriate next level of care at discharge, Engage patient in therapeutic group addressing interpersonal concerns.  Short Term Goals: Engage patient in aftercare planning with referrals and resources, Increase social support, Increase ability to appropriately verbalize feelings and Increase emotional regulation  Therapeutic Interventions: Assess for all discharge needs, 1 to 1 time with Social worker, Explore available resources and support systems, Assess for adequacy in community support network, Educate family and significant other(s) on suicide prevention, Complete Psychosocial Assessment, Interpersonal group therapy.  Evaluation of Outcomes: Adequate for Discharge   Progress in Treatment: Attending groups:Yes Participating in groups: Yes Taking medication as prescribed: Yes. Toleration medication: Yes.  Family/Significant other contact made: Yes, individual(s) contacted:  guardian Patient understands diagnosis: Yes. Discussing patient identified problems/goals with staff: Yes. Medical problems stabilized or resolved: Yes. Denies suicidal/homicidal ideation: Yes. Issues/concerns per patient self-inventory: No. Other: n/a  New problem(s) identified: None  identified at this time.   New Short Term/Long Term Goal(s): Discharge and group home placement.  Discharge Plan or Barriers: [facility] ACT team will continue to serve pt.  Patient legal guardian stated that pt's disability money is on "hold" and will not be able to help with placement at this time.  Reason for Continuation of Hospitalization: Discharge tomorrow.  Estimated Length of Stay: 1 days.   Attendees: Patient: 07/08/2016   Physician: Dr. Jennet MaduroPucilowska, MD 07/08/2016   Nursing: Leonia ReaderPhyllis Cobb, RN 07/08/2016   RN Care Manager: 07/08/2016   Social Worker: Daleen SquibbGreg Chaunce Winkels, LCSW 07/08/2016   Recreational Therapist: Hershal CoriaBeth Greene, LRT/CTRS  07/08/2016   Other: Lorna Fewhristina Yarbrough, Chaplain 07/08/2016   Other:  07/08/2016   Other: 07/08/2016            Scribe for Treatment Team: Lorri FrederickWierda, Clent Damore Jon, LCSW 07/08/2016 12:39 PM

## 2016-07-09 LAB — VALPROIC ACID LEVEL: Valproic Acid Lvl: 85 ug/mL (ref 50.0–100.0)

## 2016-07-09 MED ORDER — DESMOPRESSIN ACETATE 0.2 MG PO TABS: 0.2000 mg | ORAL_TABLET | Freq: Every day | ORAL | 1 refills | Status: DC

## 2016-07-09 MED ORDER — DIVALPROEX SODIUM 500 MG PO DR TAB: 1000.0000 mg | DELAYED_RELEASE_TABLET | Freq: Two times a day (BID) | ORAL | 1 refills | Status: DC

## 2016-07-09 MED ORDER — DOCUSATE SODIUM 100 MG PO CAPS: 200.0000 mg | ORAL_CAPSULE | Freq: Two times a day (BID) | ORAL | 1 refills | Status: DC

## 2016-07-09 MED ORDER — SENNA 8.6 MG PO TABS: 2.0000 | ORAL_TABLET | Freq: Every day | ORAL | 1 refills | Status: DC

## 2016-07-09 MED ORDER — FLUOXETINE HCL 10 MG PO CAPS: 10.0000 mg | ORAL_CAPSULE | Freq: Every day | ORAL | 1 refills | Status: DC

## 2016-07-09 MED ORDER — METOPROLOL TARTRATE 25 MG PO TABS: 12.5000 mg | ORAL_TABLET | Freq: Two times a day (BID) | ORAL | 1 refills | Status: DC

## 2016-07-09 MED ORDER — CLOZAPINE 100 MG PO TABS: 100.0000 mg | ORAL_TABLET | Freq: Two times a day (BID) | ORAL | 1 refills | Status: DC

## 2016-07-09 MED ORDER — TEMAZEPAM 15 MG PO CAPS
15.0000 mg | ORAL_CAPSULE | Freq: Every day | ORAL | Status: DC
  Administered 2016-07-09: 15 mg via ORAL
  Filled 2016-07-09: qty 1

## 2016-07-09 MED ORDER — METFORMIN HCL 500 MG PO TABS: 500.0000 mg | ORAL_TABLET | Freq: Two times a day (BID) | ORAL | 1 refills | Status: DC

## 2016-07-09 MED ORDER — IPRATROPIUM BROMIDE 0.03 % NA SOLN: 2.0000 | Freq: Every day | NASAL | 12 refills | Status: DC

## 2016-07-09 MED ORDER — SENNA 8.6 MG PO TABS
2.0000 | ORAL_TABLET | Freq: Every day | ORAL | 1 refills | Status: DC
Start: 2016-07-09 — End: 2016-10-11

## 2016-07-09 MED ORDER — POLYETHYLENE GLYCOL 3350 17 G PO PACK: 17.0000 g | PACK | Freq: Every day | ORAL | 1 refills | Status: DC

## 2016-07-09 MED ORDER — TEMAZEPAM 15 MG PO CAPS: 15.0000 mg | ORAL_CAPSULE | Freq: Every day | ORAL | 1 refills | Status: DC

## 2016-07-09 MED ORDER — SIMVASTATIN 40 MG PO TABS: 40.0000 mg | ORAL_TABLET | Freq: Every day | ORAL | 1 refills | Status: DC

## 2016-07-09 NOTE — [place] FL2 (Signed)
[place] MEDICAID FL2 LEVEL OF CARE SCREENING TOOL     IDENTIFICATION  Patient Name: Gwyneth RevelsMichael T Mullet Birthdate: 04/16/1960 Sex: male Admission Date (Current Location): 05/27/2016  [place]  [place] [id number] [facility] Facility and Address:  [facility], [address], [place], [place] [address]      Provider Number: [id number]  Attending Physician Name and Address:  Shari ProwsPucilowska, [name], MD  Relative Name and Phone Number:  Zack SealRonnie Hardie, legal guardian.  [phone]    Current Level of Care: Hospital [facility] Prior Approval Number:    Date Approved/Denied: 07/09/16 PASRR Number: [id number]   Discharge Plan: Other (Comment) (family care home)    Current Diagnoses: Patient Active Problem List   Diagnosis Date Noted  . Dyslipidemia 05/27/2016  . Constipation 05/27/2016  . Schizophrenia, undifferentiated (HCC) 05/27/2016  . Noncompliance 02/15/2016  . Tobacco use disorder 06/30/2014  . Hypertension 06/29/2014    Orientation RESPIRATION BLADDER Height & Weight     Self, Place  Normal Incontinent Weight: 177 lb (80.3 kg) Height:  5\' 8"  (172.7 cm)  BEHAVIORAL SYMPTOMS/MOOD NEUROLOGICAL BOWEL NUTRITION STATUS  Other (Comment) (has had altercations with others)  (na) Continent  (na)  AMBULATORY STATUS COMMUNICATION OF NEEDS Skin   Independent Verbally Normal                       Personal Care Assistance Level of Assistance   (na)           Functional Limitations Info   (na)          SPECIAL CARE FACTORS FREQUENCY   (na)                    Contractures Contractures Info: Not present    Additional Factors Info   (na)               Current Medications (07/09/2016):  This is the current hospital active medication list Current Facility-Administered Medications  Medication Dose Route Frequency Provider Last Rate Last Dose  . acetaminophen (TYLENOL) tablet 650  mg  650 mg Oral Q6H PRN Clapacs, John T, MD      . alum & mag hydroxide-simeth (MAALOX/MYLANTA) 200-200-20 MG/5ML suspension 30 mL  30 mL Oral Q4H PRN Clapacs, John T, MD      . cloZAPine (CLOZARIL) tablet 100 mg  100 mg Oral Daily Clapacs, Jackquline DenmarkJohn T, MD   100 mg at 07/09/16 0820  . cloZAPine (CLOZARIL) tablet 400 mg  400 mg Oral QHS Clapacs, John T, MD   400 mg at 07/08/16 2150  . desmopressin (DDAVP) tablet 0.2 mg  0.2 mg Oral QHS Pucilowska, [name], MD   0.2 mg at 07/08/16 2151  . divalproex (DEPAKOTE) DR tablet 1,000 mg  1,000 mg Oral Q12H Pucilowska, [name], MD   1,000 mg at 07/09/16 0820  . docusate sodium (COLACE) capsule 200 mg  200 mg Oral BID Pucilowska, [name], MD   200 mg at 07/09/16 0821  . FLUoxetine (PROZAC) capsule 10 mg  10 mg Oral Daily Jimmy FootmanHernandez-Gonzalez, Andrea, MD   10 mg at 07/09/16 0820  . ipratropium (ATROVENT) 0.03 % nasal spray 2 spray  2 spray Nasal QHS Jimmy FootmanHernandez-Gonzalez, Andrea, MD   2 spray at 07/08/16 2155  . magnesium hydroxide (MILK OF MAGNESIA) suspension 30 mL  30 mL Oral Daily PRN Clapacs, Jackquline DenmarkJohn T, MD   30 mL at 07/06/16 2107  . metFORMIN (  GLUCOPHAGE) tablet 500 mg  500 mg Oral BID WC Pucilowska, [name], MD   500 mg at 07/09/16 0821  . metoprolol tartrate (LOPRESSOR) tablet 12.5 mg  12.5 mg Oral BID Jimmy Footman, MD   12.5 mg at 07/09/16 0821  . polyethylene glycol (MIRALAX / GLYCOLAX) packet 17 g  17 g Oral Daily Pucilowska, [name], MD   17 g at 07/09/16 0821  . senna (SENOKOT) tablet 17.2 mg  2 tablet Oral QHS Clapacs, John T, MD   17.2 mg at 07/08/16 2150  . simvastatin (ZOCOR) tablet 40 mg  40 mg Oral QHS Clapacs, [name], MD   40 mg at 07/08/16 2151  . temazepam (RESTORIL) capsule 15 mg  15 mg Oral QHS Pucilowska, [name], MD         Discharge Medications: Please see discharge summary for a list of discharge medications.  Relevant Imaging Results:  Relevant Lab Results:   Additional Information None  Lorri Frederick,  LCSW

## 2016-07-09 NOTE — Progress Notes (Signed)
D: Pt denies SI/HI/AVH, but noted responding to internal stimuli. Pt is pleasant and cooperative, affect is flat but brightens open approach. Patient's thought are sometimes disorganized, speech is logical most times  Pt appears less anxious and he is interacting with peers and staff appropriately.  A: Pt was offered support and encouragement. Pt was given scheduled medications. Pt was encouraged to attend groups. Q 15 minute checks were done for safety.  R:Pt attends groups and interacts well with peers and staff. Pt is taking medication. Pt has no complaints.Pt receptive to treatment and safety

## 2016-07-09 NOTE — [facility] Group Notes (Signed)
[facility] Group Notes:  (Nursing/MHT/Case Management/Adjunct)  Date:  07/09/2016  Time:  1:56 PM  Type of Therapy:  Psychoeducational Skills  Participation Level:  Did Not Attend  Jeffrey SmokeCara [name] [place] Jeffrey [name] 07/09/2016, 1:56 PM

## 2016-07-09 NOTE — Progress Notes (Signed)
[name] PASSR, which was issued.  [phone] Jeffrey  Garner NashGregory Rosalynn [name], MSW, LCSW Clinical Social Worker 07/09/2016 10:51 AM

## 2016-07-09 NOTE — Progress Notes (Signed)
Pt is medication and meal compliant. Pt mood has improved and he is cooperative with staff. All discharge paperwork and follow up appointments reviewed. Pt verbalized understanding of discharge instructions. At 1730 pm staff member from group home called and told RN she is not able to pick up pt because she did not have his insurance information ready on time before their pharmacy and she will not be picking him up.She stated she could not pick him up without having coverage for his medications. She asked if we could fax his insurance info to [phone] in the morning so she would have information available. Group home staff stated she will arrive to pick up patient in the morning.

## 2016-07-09 NOTE — Plan of Care (Signed)
Problem: Safety: Goal: Ability to remain free from injury will improve Outcome: Progressing Pt remains safe while in hospital

## 2016-07-09 NOTE — Progress Notes (Signed)
CSW spoke with legal guardian Corwin LevinsRon Hardie regarding discharge.  The issue with the appointment of the guardian of the estate is not complete.  Ron has talked to DSS who is the current person who has the disability funds and both Ron and the DSS person will call Tami LinLisa Rogers at the group home to make sure she has the funds she needs to move forward with the placement. Garner NashGregory Norlene Lanes, MSW, LCSW Clinical Social Worker 07/09/2016 8:46 AM

## 2016-07-09 NOTE — Progress Notes (Signed)
  [facility] Dis[facility] :  Will you be returning to the same living situation after discharge:  No.Placement at group home. At discharge, do you have transportation home?: Yes,  group home Do you have the ability to pay for your medications: Yes,  medicaid  Release of information consent forms completed and in the chart;  Patient's signature needed at discharge.  Patient to Follow up at: Follow-up Information    [facility] Follow up on 07/10/2016.   Why:  Your ACT team will meet you at your group home tomorrow, Wednesday, 07/10/16.  They will resume services at that time. Contact information: [address] [place], [place] [address] Phone: [phone] Fax: [phone]           Next level of care provider has access to [facility] Link:no  Safety Planning and Suicide Prevention discussed: Yes,  guardian  Have you used any form of tobacco in the last 30 days? (Cigarettes, Smokeless Tobacco, Cigars, and/or Pipes): Yes  Has patient been referred to the Quitline?: Patient refused referral  Patient has been referred for addiction treatment: Yes  Lorri FrederickWierda, Romani Wilbon Jon, LCSW 07/09/2016, 1:47 PM

## 2016-07-09 NOTE — [place] FL2 (Signed)
[place] MEDICAID FL2 LEVEL OF CARE SCREENING TOOL     IDENTIFICATION  Patient Name: Jeffrey RevelsMichael T Mullet Birthdate: 04/16/1960 Sex: male Admission Date (Current Location): 05/27/2016  [place]  [place] [id number] [facility] Facility and Address:  [facility], [address], [place], [place] [address]      Provider Number: [id number]  Attending Physician Name and Address:  Shari ProwsPucilowska, [name], MD  Relative Name and Phone Number:  Zack SealRonnie Hardie, legal guardian.  [phone]    Current Level of Care: Hospital [facility] Prior Approval Number:    Date Approved/Denied: 07/09/16 PASRR Number: [id number]   Discharge Plan: Other (Comment) (family care home)    Current Diagnoses: Patient Active Problem List   Diagnosis Date Noted  . Dyslipidemia 05/27/2016  . Constipation 05/27/2016  . Schizophrenia, undifferentiated (HCC) 05/27/2016  . Noncompliance 02/15/2016  . Tobacco use disorder 06/30/2014  . Hypertension 06/29/2014    Orientation RESPIRATION BLADDER Height & Weight     Self, Place  Normal Incontinent Weight: 177 lb (80.3 kg) Height:  5\' 8"  (172.7 cm)  BEHAVIORAL SYMPTOMS/MOOD NEUROLOGICAL BOWEL NUTRITION STATUS  Other (Comment) (has had altercations with others)  (na) Continent  (na)  AMBULATORY STATUS COMMUNICATION OF NEEDS Skin   Independent Verbally Normal                       Personal Care Assistance Level of Assistance   (na)           Functional Limitations Info   (na)          SPECIAL CARE FACTORS FREQUENCY   (na)                    Contractures Contractures Info: Not present    Additional Factors Info   (na)               Current Medications (07/09/2016):  This is the current hospital active medication list Current Facility-Administered Medications  Medication Dose Route Frequency Provider Last Rate Last Dose  . acetaminophen (TYLENOL) tablet 650  mg  650 mg Oral Q6H PRN Clapacs, John T, MD      . alum & mag hydroxide-simeth (MAALOX/MYLANTA) 200-200-20 MG/5ML suspension 30 mL  30 mL Oral Q4H PRN Clapacs, John T, MD      . cloZAPine (CLOZARIL) tablet 100 mg  100 mg Oral Daily Clapacs, Jackquline DenmarkJohn T, MD   100 mg at 07/09/16 0820  . cloZAPine (CLOZARIL) tablet 400 mg  400 mg Oral QHS Clapacs, John T, MD   400 mg at 07/08/16 2150  . desmopressin (DDAVP) tablet 0.2 mg  0.2 mg Oral QHS Pucilowska, [name], MD   0.2 mg at 07/08/16 2151  . divalproex (DEPAKOTE) DR tablet 1,000 mg  1,000 mg Oral Q12H Pucilowska, [name], MD   1,000 mg at 07/09/16 0820  . docusate sodium (COLACE) capsule 200 mg  200 mg Oral BID Pucilowska, [name], MD   200 mg at 07/09/16 0821  . FLUoxetine (PROZAC) capsule 10 mg  10 mg Oral Daily Jimmy FootmanHernandez-Gonzalez, Andrea, MD   10 mg at 07/09/16 0820  . ipratropium (ATROVENT) 0.03 % nasal spray 2 spray  2 spray Nasal QHS Jimmy FootmanHernandez-Gonzalez, Andrea, MD   2 spray at 07/08/16 2155  . magnesium hydroxide (MILK OF MAGNESIA) suspension 30 mL  30 mL Oral Daily PRN Clapacs, Jackquline DenmarkJohn T, MD   30 mL at 07/06/16 2107  . metFORMIN (  GLUCOPHAGE) tablet 500 mg  500 mg Oral BID WC Pucilowska, [name], MD   500 mg at 07/09/16 0821  . metoprolol tartrate (LOPRESSOR) tablet 12.5 mg  12.5 mg Oral BID Jimmy Footman, MD   12.5 mg at 07/09/16 0821  . polyethylene glycol (MIRALAX / GLYCOLAX) packet 17 g  17 g Oral Daily Pucilowska, [name], MD   17 g at 07/09/16 0821  . senna (SENOKOT) tablet 17.2 mg  2 tablet Oral QHS Clapacs, John T, MD   17.2 mg at 07/08/16 2150  . simvastatin (ZOCOR) tablet 40 mg  40 mg Oral QHS Clapacs, [name], MD   40 mg at 07/08/16 2151  . temazepam (RESTORIL) capsule 15 mg  15 mg Oral QHS Pucilowska, [name], MD         Discharge Medications: Please see discharge summary for a list of discharge medications.  Relevant Imaging Results:  Relevant Lab Results:   Additional Information None  Lorri Frederick,  LCSW

## 2016-07-09 NOTE — [facility] Group Notes (Signed)
[facility] Group Notes:  (Nursing/MHT/Case Management/Adjunct)  Date:  07/09/2016  Time:  12:45 AM  Type of Therapy:  Group Therapy  Participation Level:  Minimal  Participation Quality:  Appropriate  Affect:  Appropriate  Cognitive:  Lacking  Insight:  None  Engagement in Group:  None  Modes of Intervention:  Support  Summary of Progress/Problems:  Mayra NeerJackie L Lautaro Koral 07/09/2016, 12:45 AM

## 2016-07-09 NOTE — Discharge Summary (Addendum)
Physician Discharge Summary Note  Patient:  Jeffrey [name] is an 56 y.o., male MRN:  [id number] DOB:  30-Dec-1960 Patient phone:  [phone] (home)  Patient address:   [address] [address] [facility] [place] [address],  Total Time spent with patient: 30 minutes  Date of Admission:  05/27/2016 Date of Discharge: 07/09/2016  Reason for Admission:  Psychotic break.  ADDENDUM: We will plan to discharge Jeffrey [name] today by his new group home would not take him up because the pharmacy would not dispense his medications. Apparently the patient has Medicaid as well as [employer]. He has no drug benefits with Medicare and the pharmacist could not identify payer source. He will be hopefully discharged to his new group home tomorrow.  Identifying data. Jeffrey [name] is a 56 year old male with a history of schizophrenia.  Chief complaint. "You upset me, I need some rest."  History of present illness. Information was obtained from the patient and the chart. The patient has a long history of schizophrenia with multiple psychiatric admissions, treatment noncompliance, self-mutilation and aggressive behavior when psychotic. The patient was petitioned by his ACT team member for disorganized and psychotic behavior in the context of treatment noncompliance and tragic death of his mother, for inability to care for himself. The patient is unable to participate in the interview. He is restless, mumbling and yelling to his voices, talking about school and teachers. According to the chart, he was hospitalized at [facility] at the beginning on April. Following discharge, he was reportedly staying at his mother's house by himself. He drove to [place] where he was caught driving against traffic and was brought back home. He was likely noncompliant with medications as indicating documented by low VPA level. It is unclear if he was hospitalized in [place]. In the emergency room he was restarted on a combination of  Depakote and Clozaril. The patient himself is unable to provide much information. I am very familiar with this patient and treated him several times over the years. The patient reportedly has a new guardian since his mother death.  Past psychiatric history. Long history of schizophrenia or schizoaffective disorder with multiple exacerbations and hospitalizations. He has been tried on numerous medications including. He responded well to Clozaril. 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  Family psychiatric history. Nonreported.  Social history. Reportedly his mother, Jeffrey [name], passed away a month or so ago. She had been his staunch supporter over the years. The patient has been placed several times in group homes in the past. He will require placement again.  Principal Problem: Schizophrenia, undifferentiated (HCC) Discharge Diagnoses: Patient Active Problem List   Diagnosis Date Noted  . Dyslipidemia [E78.5] 05/27/2016  . Constipation [K59.00] 05/27/2016  . Schizophrenia, undifferentiated (HCC) [F20.3] 05/27/2016  . Noncompliance [Z91.19] 02/15/2016  . Tobacco use disorder [F17.200] 06/30/2014  . Hypertension [I10] 06/29/2014   Past Medical History:  Past Medical History:  Diagnosis Date  . Hypertension   . Schizophrenia [facility])     Past Surgical History:  Procedure Laterality Date  . 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 No pertinent family history.  Social History:  History  Alcohol Use No     History  Drug Use No    Social History   Social History  . Marital status: Single    Spouse name: N/A  . Number of children: N/A  . Years of education:  N/A   Social History Main Topics  . Smoking status: Current Every Day Smoker    Packs/day: 1.00    Types: Cigarettes  . Smokeless tobacco: Current User  . Alcohol use No  . Drug use: No  . Sexual activity: Not Asked   Other Topics Concern   . None   Social History Narrative  . None    Hospital Course:    Jeffrey [name] is a 56 year old male with a history of schizophrenia admitted for psychotic break in the context of medication noncompliance and severe social stressors.  1. Schizophrenia. We continued Clozapine for psychosis and Depakote for mood stabilization. He is also on Metformin for metabolic syndrome prevention. VPA 83. Clozapine level pending. The patient is still mildly paranoid but functions well within hospital community.  2. Depression. We started Prozac.  3. Insomnia. He responded well to Restoril but slept only 3 hours last night.  4. Enuresis. Improved on desmopressin.   5. Sialorrhea. We started Atrovent sublingually at night.  6. Dyslipidemia. We continued Zocor.  7. Constipation. He is on bowel regimen.   8. Smoking. Niicotine patch is available.  9. Metabolic syndrome monitoring. Lipid panel and TSH are normal. HgbA1C 5.6.   10. EKG. Normal sinus rhythm, QTc 427.  11. Social. The patient has a new legal guardian.   12. Disposition. He was discharged to a new group home with his guardian's approval. He will follow up with ACT team.   Physical Findings: AIMS: Facial and Oral Movements Muscles of Facial Expression: None, normal Lips and Perioral Area: None, normal Jaw: None, normal Tongue: None, normal,Extremity Movements Upper (arms, wrists, hands, fingers): Minimal Lower (legs, knees, ankles, toes): Minimal, Trunk Movements Neck, shoulders, hips: None, normal, Overall Severity Severity of abnormal movements (highest score from questions above): Minimal Incapacitation due to abnormal movements: None, normal Patient's awareness of abnormal movements (rate only patient's report): No Awareness, Dental Status Current problems with teeth and/or dentures?: Yes (missing teeth) Does patient usually wear dentures?: No  CIWA:    COWS:     Musculoskeletal: Strength & Muscle Tone:  within normal limits Gait & Station: normal Patient leans: N/A  Psychiatric Specialty Exam: Physical Exam  Nursing note and vitals reviewed. Psychiatric: His speech is normal and behavior is normal. His affect is blunt. Thought content is paranoid and delusional. Cognition and memory are normal. He expresses impulsivity.    Review of Systems  All other systems reviewed and are negative.   Blood pressure 127/79, pulse 81, temperature 98 F (36.7 C), temperature source Oral, resp. rate 18, height 5\' 8"  (1.727 m), weight 80.3 kg (177 lb), SpO2 99 %.Body mass index is 26.91 kg/m.  General Appearance: Fairly Groomed  Eye Contact:  Good  Speech:  Clear and Coherent  Volume:  Normal  Mood:  Euthymic  Affect:  Blunt  Thought Process:  Goal Directed and Descriptions of Associations: Intact  Orientation:  Full (Time, Place, and Person)  Thought Content:  Delusions and Paranoid Ideation  Suicidal Thoughts:  No  Homicidal Thoughts:  No  Memory:  Immediate;   Fair Recent;   Fair Remote;   Fair  Judgement:  Poor  Insight:  Shallow  Psychomotor Activity:  Normal  Concentration:  Concentration: Fair and Attention Span: Fair  Recall:  [employer]  Fund of Knowledge:  Fair  Language:  Fair  Akathisia:  No  Handed:  Right  AIMS (if indicated):     Assets:  Communication Skills Desire for Improvement Financial Resources/Insurance  Physical Health Resilience  ADL's:  Intact  Cognition:  WNL  Sleep:  Number of Hours: 3     Have you used any form of tobacco in the last 30 days? (Cigarettes, Smokeless Tobacco, Cigars, and/or Pipes): Yes  Has this patient used any form of tobacco in the last 30 days? (Cigarettes, Smokeless Tobacco, Cigars, and/or Pipes) Yes, Yes, A prescription for an FDA-approved tobacco cessation medication was offered at discharge and the patient refused  Blood Alcohol level:  Lab Results  Component Value Date   [facility] <5 05/26/2016   ETH <5 04/26/2016    Metabolic  Disorder Labs:  Lab Results  Component Value Date   HGBA1C 5.6 05/28/2016   MPG 114 05/28/2016   No results found for: PROLACTIN Lab Results  Component Value Date   CHOL 139 05/28/2016   TRIG 84 05/28/2016   HDL 43 05/28/2016   CHOLHDL 3.2 05/28/2016   VLDL 17 05/28/2016   LDLCALC 79 05/28/2016   LDLCALC 74 11/08/2014    See Psychiatric Specialty Exam and Suicide Risk Assessment completed by Attending Physician prior to discharge.  Discharge destination:  Home  Is patient on multiple antipsychotic therapies at discharge:  No   Has Patient had three or more failed trials of antipsychotic monotherapy by history:  No  Recommended Plan for Multiple Antipsychotic Therapies: NA  Discharge Instructions    Diet - low sodium heart healthy    Complete by:  As directed    Increase activity slowly    Complete by:  As directed      Allergies as of 07/09/2016   No Known Allergies     Medication List    STOP taking these medications   paliperidone 6 MG 24 hr tablet Commonly known as:  INVEGA   traZODone 100 MG tablet Commonly known as:  DESYREL     TAKE these medications     Indication  cloZAPine 100 MG tablet Commonly known as:  CLOZARIL Take 1-4 tablets (100-400 mg total) by mouth 2 (two) times daily. Patient takes 1 tablet (100 mg) in the morning and 4 tablets (400 mg) at bedtime.  Indication:  Schizophrenia   desmopressin 0.2 MG tablet Commonly known as:  DDAVP Take 1 tablet (0.2 mg total) by mouth at bedtime.  Indication:  Bedwetting   divalproex 500 MG DR tablet Commonly known as:  DEPAKOTE Take 2 tablets (1,000 mg total) by mouth every 12 (twelve) hours.  Indication:  Schizophrenia   docusate sodium 100 MG capsule Commonly known as:  COLACE Take 2 capsules (200 mg total) by mouth 2 (two) times daily.  Indication:  Constipation   FLUoxetine 10 MG capsule Commonly known as:  PROZAC Take 1 capsule (10 mg total) by mouth daily.  Indication:  Major  Depressive Disorder   ipratropium 0.03 % nasal spray Commonly known as:  ATROVENT Place 2 sprays into the nose at bedtime.  Indication:  Increased Production of Saliva   metFORMIN 500 MG tablet Commonly known as:  GLUCOPHAGE Take 1 tablet (500 mg total) by mouth 2 (two) times daily with a meal.  Indication:  Antipsychotic Therapy-Induced Weight Gain   metoprolol tartrate 25 MG tablet Commonly known as:  LOPRESSOR Take 0.5 tablets (12.5 mg total) by mouth 2 (two) times daily.  Indication:  High Blood Pressure Disorder   polyethylene glycol packet Commonly known as:  MIRALAX / GLYCOLAX Take 17 g by mouth daily.  Indication:  Constipation   senna 8.6 MG Tabs tablet Commonly known  asMancel Parsons Take 2 tablets (17.2 mg total) by mouth at bedtime.  Indication:  Constipation   simvastatin 40 MG tablet Commonly known as:  ZOCOR Take 1 tablet (40 mg total) by mouth at bedtime.  Indication:  High Amount of Fats in the Blood   temazepam 15 MG capsule Commonly known as:  RESTORIL Take 1 capsule (15 mg total) by mouth at bedtime.  Indication:  Trouble Sleeping      Follow-up Information    [facility] Follow up.   Why:  Current w ACT Team Contact information: [address] [place], [place] [address] Phone: [phone] Fax: [phone]           Follow-up recommendations:  Activity:  as tolerated. Diet:  low sodium heart healthy. Other:  keep follow up appointment.  Comments:    Signed: Kristine Linea, MD 07/09/2016, 9:27 AM

## 2016-07-09 NOTE — [facility] Group Notes (Signed)
[facility] Group Notes:  (Nursing/MHT/Case Management/Adjunct)  Date:  07/09/2016  Time:  9:47 PM  Type of Therapy:  Group Therapy  Participation Level:  Active  Participation Quality:  Appropriate  Affect:  Appropriate  Cognitive:  Appropriate  Insight:  Appropriate  Engagement in Group:  Limited  Modes of Intervention:  Support  Summary of Progress/Problems:  Jeffrey [name] 07/09/2016, 9:47 PM

## 2016-07-09 NOTE — [facility] Suicide Risk Assessment (Signed)
[facility]   Principal Problem: Schizophrenia, undifferentiated (HCC) Discharge Diagnoses:  Patient Active Problem List   Diagnosis Date Noted  . Dyslipidemia [E78.5] 05/27/2016  . Constipation [K59.00] 05/27/2016  . Schizophrenia, undifferentiated (HCC) [F20.3] 05/27/2016  . Noncompliance [Z91.19] 02/15/2016  . Tobacco use disorder [F17.200] 06/30/2014  . Hypertension [I10] 06/29/2014    Total Time spent with patient: 30 minutes  Musculoskeletal: Strength & Muscle Tone: within normal limits Gait & Station: normal Patient leans: N/A  Psychiatric Specialty Exam: Review of Systems  Genitourinary:       Incontinence  All other systems reviewed and are negative.   Blood pressure 127/79, pulse 81, temperature 98 F (36.7 C), temperature source Oral, resp. rate 18, height 5\' 8"  (1.727 m), weight 80.3 kg (177 lb), SpO2 99 %.Body mass index is 26.91 kg/m.  General Appearance: Casual  Eye Contact::  Good  Speech:  Clear and Coherent409  Volume:  Normal  Mood:  Euthymic  Affect:  Appropriate and Blunt  Thought Process:  Goal Directed and Descriptions of Associations: Intact  Orientation:  Full (Time, Place, and Person)  Thought Content:  Delusions and Paranoid Ideation  Suicidal Thoughts:  No  Homicidal Thoughts:  No  Memory:  Immediate;   Fair Recent;   Fair Remote;   Fair  Judgement:  Impaired  Insight:  Shallow  Psychomotor Activity:  Normal  Concentration:  Fair  Recall:  [employer]Fair  Fund of Knowledge:Fair  Language: Fair  Akathisia:  No  Handed:  Right  AIMS (if indicated):     Assets:  Communication Skills Desire for Improvement Financial Resources/Insurance Housing Physical Health Resilience  Sleep:  Number of Hours: 3  Cognition: WNL  ADL's:  Intact   Mental Status Per Nursing Assessment::   On Admission:  NA  Demographic Factors:  Male and Caucasian  Loss Factors: Loss of significant relationship  Historical Factors: Prior  suicide attempts, Family history of mental illness or substance abuse and Impulsivity  Risk Reduction Factors:   Sense of responsibility to family and Positive therapeutic relationship  Continued Clinical Symptoms:  Schizophrenia:   Paranoid or undifferentiated type  Cognitive Features That Contribute To Risk:  None    Suicide Risk:  Minimal: No identifiable suicidal ideation.  Patients presenting with no risk factors but with morbid ruminations; may be classified as minimal risk based on the severity of the depressive symptoms  Follow-up Information    [facility] Follow up.   Why:  Current w ACT Team Contact information: [address] [place], [place] [address] Phone: [phone] Fax: [phone]           Plan Of Care/Follow-up recommendations:  Activity:  as tolerated. Diet:  low sodium heart healthy. Other:  keep follow up appointments.  Kristine LineaJolanta Holten Spano, MD 07/09/2016, 9:27 AM

## 2016-07-09 NOTE — [facility] Group Notes (Signed)
[facility] LCSW Group Therapy Note  Date/Time:07/09/2016, 3pm  Type of Therapy/Topic:  Group Therapy:  Feelings about Diagnosis  Participation Level:  Did Not Attend     Glennon MacSara P Lacara Dunsworth, L CSW 07/09/2016, 5:18 PM

## 2016-07-09 NOTE — [facility] Group Notes (Signed)
Goals Group Date/Time: 07/09/2016 9:00 AM Type of Therapy and Topic: Group Therapy: Goals Group: SMART Goals   Participation Level: minimal  Description of Group:    The purpose of a daily goals group is to assist and guide patients in setting recovery/wellness-related goals. The objective is to set goals as they relate to the crisis in which they were admitted. Patients will be using SMART goal modalities to set measurable goals. Characteristics of realistic goals will be discussed and patients will be assisted in setting and processing how one will reach their goal. Facilitator will also assist patients in applying interventions and coping skills learned in psycho-education groups to the SMART goal and process how one will achieve defined goal.   Therapeutic Goals:   -Patients will develop and document one goal related to or their crisis in which brought them into treatment.  -Patients will be guided by LCSW using SMART goal setting modality in how to set a measurable, attainable, realistic and time sensitive goal.  -Patients will process barriers in reaching goal.  -Patients will process interventions in how to overcome and successful in reaching goal.   Patient's Goal: Pt unable to communicate his goal at this time.   Therapeutic Modalities:  Motivational Interviewing  Engineer, manufacturing systemsCognitive Behavioral Therapy  Crisis Intervention Model  SMART goals setting   Glennon MacSara P Tansy Lorek, LCSW 07/09/2016, 5:17 PM

## 2016-07-10 LAB — CLOZAPINE (CLOZARIL)
CLOZAPINE LVL: 392 ng/mL (ref 350–650)
NORCLOZAPINE: 110 ng/mL
TOTAL(CLOZ+ NORCLOZ): 502 ng/mL

## 2016-07-10 NOTE — Progress Notes (Signed)
Pt denies SI, HI, a/v hallucinations. Pt commits to safety for discharge. Follow up appointments, discharge medications, and treatment reviewed. Pt verbalized understanding of discharge instructions. Group home staff given PPD results and LF2. All belongings returned to pt upon discharge. Pt discharged safely
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
## 2016-07-10 NOTE — Plan of Care (Signed)
Problem: Coping: Goal: Ability to verbalize feelings will improve Outcome: Progressing Patient verbalized feeling to staff.    

## 2016-07-10 NOTE — BHH Suicide Risk Assessment (Signed)
[facility] Discharge Suicide Risk Assessment   Principal Problem: Schizophrenia, undifferentiated (HCC) Discharge Diagnoses:  Patient Active Problem List   Diagnosis Date Noted  . Dyslipidemia [E78.5] 05/27/2016  . Constipation [K59.00] 05/27/2016  . Schizophrenia, undifferentiated (HCC) [F20.3] 05/27/2016  . Noncompliance [Z91.19] 02/15/2016  . Tobacco use disorder [F17.200] 06/30/2014  . Hypertension [I10] 06/29/2014    Total Time spent with patient: 30 minutes  Musculoskeletal: Strength & Muscle Tone: within normal limits Gait & Station: normal Patient leans: N/A  Psychiatric Specialty Exam: Review of Systems  Genitourinary:       Incontinence  All other systems reviewed and are negative.   Blood pressure 127/79, pulse 81, temperature 98 F (36.7 C), temperature source Oral, resp. rate 18, height 5\' 8"  (1.727 m), weight 80.3 kg (177 lb), SpO2 99 %.Body mass index is 26.91 kg/m.  General Appearance: Casual  Eye Contact::  Good  Speech:  Clear and Coherent409  Volume:  Normal  Mood:  Euthymic  Affect:  Appropriate and Blunt  Thought Process:  Goal Directed and Descriptions of Associations: Intact  Orientation:  Full (Time, Place, and Person)  Thought Content:  Delusions and Paranoid Ideation  Suicidal Thoughts:  No  Homicidal Thoughts:  No  Memory:  Immediate;   Fair Recent;   Fair Remote;   Fair  Judgement:  Impaired  Insight:  Shallow  Psychomotor Activity:  Normal  Concentration:  Fair  Recall:  FiservFair  Fund of Knowledge:Fair  Language: Fair  Akathisia:  No  Handed:  Right  AIMS (if indicated):     Assets:  Communication Skills Desire for Improvement Financial Resources/Insurance Housing Physical Health Resilience  Sleep:  Number of Hours: 8  Cognition: WNL  ADL's:  Intact   Mental Status Per Nursing Assessment::   On Admission:  NA  Demographic Factors:  Male and Caucasian  Loss Factors: Loss of significant relationship  Historical Factors: Prior  suicide attempts, Family history of mental illness or substance abuse and Impulsivity  Risk Reduction Factors:   Sense of responsibility to family and Positive therapeutic relationship  Continued Clinical Symptoms:  Schizophrenia:   Paranoid or undifferentiated type  Cognitive Features That Contribute To Risk:  None    Suicide Risk:  Minimal: No identifiable suicidal ideation.  Patients presenting with no risk factors but with morbid ruminations; may be classified as minimal risk based on the severity of the depressive symptoms   Contact information for follow-up providers    [facility] Follow up on 07/10/2016.   Why:  Your ACT team will meet you at your group home tomorrow, Wednesday, 07/10/16.  They will resume services at that time. Contact information: [address] [place], [place] [address] Phone: [phone] Fax: [phone]            Contact information for after-discharge care    Destination    [facility] .   Specialty:  Group Home Contact information: [address] [place] [id number] [phone]                  Plan Of Care/Follow-up recommendations:  Activity:  as tolerated. Diet:  low sodium heart healthy. Other:  keep follow up appointments.  Kristine LineaJolanta Kellan Raffield, MD 07/10/2016, 10:14 AM

## 2016-07-12 NOTE — Care Management (Signed)
Adequate for discharge. [employer] made aware of discharge via portal. BH  Appointment witin 7 days of discharge is noted.

## 2016-08-24 ENCOUNTER — Emergency Department
Admission: EM | Admit: 2016-08-24 | Discharge: 2016-08-30 | Disposition: A | Discharge status: Home / Self Care | Payer: Medicare Other | Attending: Emergency Medicine | Admitting: Emergency Medicine

## 2016-08-24 ENCOUNTER — Encounter: Payer: Self-pay | Admitting: Emergency Medicine

## 2016-08-24 DIAGNOSIS — Z111 Encounter for screening for respiratory tuberculosis: Secondary | ICD-10-CM

## 2016-08-24 DIAGNOSIS — F919 Conduct disorder, unspecified: Secondary | ICD-10-CM | POA: Diagnosis not present

## 2016-08-24 DIAGNOSIS — R4689 Other symptoms and signs involving appearance and behavior: Secondary | ICD-10-CM

## 2016-08-24 DIAGNOSIS — F209 Schizophrenia, unspecified: Secondary | ICD-10-CM | POA: Diagnosis not present

## 2016-08-24 DIAGNOSIS — F17210 Nicotine dependence, cigarettes, uncomplicated: Secondary | ICD-10-CM | POA: Diagnosis not present

## 2016-08-24 DIAGNOSIS — I10 Essential (primary) hypertension: Secondary | ICD-10-CM | POA: Diagnosis not present

## 2016-08-24 DIAGNOSIS — Z79899 Other long term (current) drug therapy: Secondary | ICD-10-CM | POA: Insufficient documentation

## 2016-08-24 DIAGNOSIS — E785 Hyperlipidemia, unspecified: Secondary | ICD-10-CM | POA: Diagnosis present

## 2016-08-24 DIAGNOSIS — K5900 Constipation, unspecified: Secondary | ICD-10-CM | POA: Diagnosis present

## 2016-08-24 DIAGNOSIS — F203 Undifferentiated schizophrenia: Secondary | ICD-10-CM | POA: Diagnosis not present

## 2016-08-24 LAB — CBC WITH DIFFERENTIAL/PLATELET
BASOS ABS: 0.1 10*3/uL (ref 0–0.1)
BASOS PCT: 1 %
EOS PCT: 0 %
Eosinophils Absolute: 0 10*3/uL (ref 0–0.7)
HCT: 40.3 % (ref 40.0–52.0)
Hemoglobin: 14.1 g/dL (ref 13.0–18.0)
Lymphocytes Relative: 25 %
Lymphs Abs: 2.6 10*3/uL (ref 1.0–3.6)
MCH: 31.3 pg (ref 26.0–34.0)
MCHC: 35 g/dL (ref 32.0–36.0)
MCV: 89.6 fL (ref 80.0–100.0)
MONO ABS: 0.9 10*3/uL (ref 0.2–1.0)
Monocytes Relative: 9 %
Neutro Abs: 6.7 10*3/uL — ABNORMAL HIGH (ref 1.4–6.5)
Neutrophils Relative %: 65 %
PLATELETS: 239 10*3/uL (ref 150–440)
RBC: 4.5 MIL/uL (ref 4.40–5.90)
RDW: 15 % — AB (ref 11.5–14.5)
WBC: 10.3 10*3/uL (ref 3.8–10.6)

## 2016-08-24 LAB — BASIC METABOLIC PANEL
ANION GAP: 9 (ref 5–15)
BUN: 16 mg/dL (ref 6–20)
CALCIUM: 9.1 mg/dL (ref 8.9–10.3)
CO2: 24 mmol/L (ref 22–32)
Chloride: 102 mmol/L (ref 101–111)
Creatinine, Ser: 0.86 mg/dL (ref 0.61–1.24)
GLUCOSE: 145 mg/dL — AB (ref 65–99)
Potassium: 4 mmol/L (ref 3.5–5.1)
Sodium: 135 mmol/L (ref 135–145)

## 2016-08-24 LAB — URINE DRUG SCREEN, QUALITATIVE (ARMC ONLY)
Amphetamines, Ur Screen: NOT DETECTED
BARBITURATES, UR SCREEN: NOT DETECTED
BENZODIAZEPINE, UR SCRN: POSITIVE — AB
CANNABINOID 50 NG, UR ~~LOC~~: NOT DETECTED
Cocaine Metabolite,Ur ~~LOC~~: NOT DETECTED
MDMA (Ecstasy)Ur Screen: NOT DETECTED
Methadone Scn, Ur: NOT DETECTED
Opiate, Ur Screen: NOT DETECTED
PHENCYCLIDINE (PCP) UR S: NOT DETECTED
Tricyclic, Ur Screen: NOT DETECTED

## 2016-08-24 MED ORDER — DIPHENHYDRAMINE HCL 50 MG/ML IJ SOLN
INTRAMUSCULAR | Status: AC
  Filled 2016-08-24: qty 1

## 2016-08-24 MED ORDER — LORAZEPAM 2 MG PO TABS
2.0000 mg | ORAL_TABLET | Freq: Once | ORAL | Status: AC
  Administered 2016-08-24: 2 mg via ORAL

## 2016-08-24 MED ORDER — LORAZEPAM 1 MG PO TABS
ORAL_TABLET | ORAL | Status: AC
  Administered 2016-08-24: 2 mg via ORAL
  Filled 2016-08-24: qty 2

## 2016-08-24 MED ORDER — HALOPERIDOL LACTATE 5 MG/ML IJ SOLN
INTRAMUSCULAR | Status: AC
  Filled 2016-08-24: qty 1
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
## 2016-08-24 NOTE — ED Notes (Signed)
Pt requesting to have his clothes. Pt wanting to leave. Pt becoming agitated. This RN talked to pt and he has agreed to take some medication to help calm him. Dr Derrill KayGoodman informed and new order received.

## 2016-08-24 NOTE — ED Notes (Addendum)
Spoke with Corwin Levinson Hardie, patients legal guardian who will speak with the group home about picking up the patient when he is up for discharge.

## 2016-08-24 NOTE — ED Notes (Signed)
Report given to SOC MD.  

## 2016-08-24 NOTE — ED Notes (Signed)
Last psych admission in may 2018. Per group home pt has only been there for 3 weeks and progressively getting out of control.
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
## 2016-08-24 NOTE — ED Notes (Signed)
BEHAVIORAL HEALTH ROUNDING Patient sleeping: No. Patient alert and oriented: yes Behavior appropriate: Yes.  ; If no, describe:  Nutrition and fluids offered: Yes  Toileting and hygiene offered: Yes  Sitter present: not applicable Law enforcement present: Yes  

## 2016-08-24 NOTE — ED Notes (Signed)
Report given to Claris CheMargaret, RN in ED [facility].

## 2016-08-24 NOTE — ED Notes (Signed)
SOC machine brought into pt. Room.  Pt. Refusing to talk to [facility].  Pt. States "Get that damn machine out of here".  Pt. States" I have already had EKG"   Pt. told it is to talk to Psychiatrist.  Pt. "I don't want to talk to him".
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
## 2016-08-24 NOTE — ED Notes (Signed)
Message for Zebedee IbaRon Johnson on cell [phone] that we have a patient here he is listed as legal guardian for and our number and extension left for his return call. Patient name not left on message.

## 2016-08-24 NOTE — ED Notes (Signed)
SOC computer placed in room and patient [facility].

## 2016-08-24 NOTE — ED Notes (Signed)

## 2016-08-24 NOTE — ED Notes (Signed)
BEHAVIORAL HEALTH ROUNDING  Patient sleeping: No.  Patient alert and oriented: yes  Behavior appropriate: No. ; If no, describe:  Nutrition and fluids offered: Yes  Toileting and hygiene offered: Yes  Sitter present: not applicable, Q 15 min safety rounds and observation.  Law enforcement present: Yes ODS

## 2016-08-24 NOTE — ED Notes (Signed)

## 2016-08-25 DIAGNOSIS — F919 Conduct disorder, unspecified: Secondary | ICD-10-CM | POA: Diagnosis not present

## 2016-08-25 MED ORDER — TEMAZEPAM 7.5 MG PO CAPS
15.0000 mg | ORAL_CAPSULE | Freq: Every day | ORAL | Status: DC
  Administered 2016-08-25 – 2016-08-29 (×5): 15 mg via ORAL
  Filled 2016-08-25 (×5): qty 2

## 2016-08-25 MED ORDER — CLOZAPINE 100 MG PO TABS
100.0000 mg | ORAL_TABLET | Freq: Every day | ORAL | Status: DC
  Administered 2016-08-25 – 2016-08-30 (×6): 100 mg via ORAL
  Filled 2016-08-25 (×2): qty 1
  Filled 2016-08-25: qty 4
  Filled 2016-08-25 (×3): qty 1

## 2016-08-25 MED ORDER — POLYETHYLENE GLYCOL 3350 17 G PO PACK
17.0000 g | PACK | Freq: Every day | ORAL | Status: DC
  Administered 2016-08-25 – 2016-08-27 (×2): 17 g via ORAL
  Filled 2016-08-25 (×3): qty 1

## 2016-08-25 MED ORDER — CLOZAPINE 100 MG PO TABS: 100.0000 mg | ORAL_TABLET | Freq: Two times a day (BID) | ORAL | Status: DC

## 2016-08-25 MED ORDER — DIVALPROEX SODIUM 500 MG PO DR TAB
1000.0000 mg | DELAYED_RELEASE_TABLET | Freq: Two times a day (BID) | ORAL | Status: DC
  Administered 2016-08-25 – 2016-08-30 (×11): 1000 mg via ORAL
  Filled 2016-08-25 (×11): qty 2

## 2016-08-25 MED ORDER — FLUOXETINE HCL 10 MG PO CAPS
10.0000 mg | ORAL_CAPSULE | Freq: Every day | ORAL | Status: DC
  Administered 2016-08-25 – 2016-08-30 (×6): 10 mg via ORAL
  Filled 2016-08-25 (×7): qty 1

## 2016-08-25 MED ORDER — IPRATROPIUM BROMIDE 0.03 % NA SOLN
2.0000 | Freq: Every day | NASAL | Status: DC
  Administered 2016-08-25 – 2016-08-29 (×5): 2 via NASAL
  Filled 2016-08-25: qty 30

## 2016-08-25 MED ORDER — MOMETASONE FURO-FORMOTEROL FUM 200-5 MCG/ACT IN AERO
2.0000 | INHALATION_SPRAY | Freq: Two times a day (BID) | RESPIRATORY_TRACT | Status: DC
  Administered 2016-08-25 – 2016-08-29 (×9): 2 via RESPIRATORY_TRACT
  Filled 2016-08-25: qty 8.8

## 2016-08-25 MED ORDER — SENNA 8.6 MG PO TABS
2.0000 | ORAL_TABLET | Freq: Every day | ORAL | Status: DC
  Administered 2016-08-25 – 2016-08-29 (×4): 17.2 mg via ORAL
  Filled 2016-08-25 (×6): qty 2

## 2016-08-25 MED ORDER — METFORMIN HCL 500 MG PO TABS
500.0000 mg | ORAL_TABLET | Freq: Two times a day (BID) | ORAL | Status: DC
  Administered 2016-08-25 – 2016-08-29 (×8): 500 mg via ORAL
  Filled 2016-08-25 (×10): qty 1

## 2016-08-25 MED ORDER — METOPROLOL TARTRATE 25 MG PO TABS
12.5000 mg | ORAL_TABLET | Freq: Two times a day (BID) | ORAL | Status: DC
  Administered 2016-08-25 – 2016-08-29 (×10): 12.5 mg via ORAL
  Filled 2016-08-25 (×11): qty 1

## 2016-08-25 MED ORDER — CLOZAPINE 100 MG PO TABS
400.0000 mg | ORAL_TABLET | Freq: Every day | ORAL | Status: DC
  Administered 2016-08-25 – 2016-08-29 (×5): 400 mg via ORAL
  Filled 2016-08-25 (×5): qty 4

## 2016-08-25 MED ORDER — METOPROLOL TARTRATE 25 MG PO TABS
ORAL_TABLET | ORAL | Status: AC
Start: 2016-08-25 — End: 2016-08-25
  Administered 2016-08-25: 12.5 mg via ORAL
  Filled 2016-08-25: qty 1

## 2016-08-25 MED ORDER — DOCUSATE SODIUM 100 MG PO CAPS
200.0000 mg | ORAL_CAPSULE | Freq: Two times a day (BID) | ORAL | Status: DC
  Administered 2016-08-25 – 2016-08-29 (×8): 200 mg via ORAL
  Filled 2016-08-25 (×14): qty 2

## 2016-08-25 MED ORDER — LORAZEPAM 1 MG PO TABS
1.0000 mg | ORAL_TABLET | Freq: Four times a day (QID) | ORAL | Status: DC | PRN
  Administered 2016-08-25 – 2016-08-29 (×5): 1 mg via ORAL
  Filled 2016-08-25 (×5): qty 1

## 2016-08-25 MED ORDER — DESMOPRESSIN ACETATE 0.2 MG PO TABS
0.2000 mg | ORAL_TABLET | Freq: Every day | ORAL | Status: DC
  Administered 2016-08-25 – 2016-08-29 (×5): 0.2 mg via ORAL
  Filled 2016-08-25 (×5): qty 1

## 2016-08-25 MED ORDER — SIMVASTATIN 40 MG PO TABS
40.0000 mg | ORAL_TABLET | Freq: Every day | ORAL | Status: DC
  Administered 2016-08-25 – 2016-08-29 (×5): 40 mg via ORAL
  Filled 2016-08-25 (×5): qty 1
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
## 2016-08-25 NOTE — ED Provider Notes (Signed)
-----------------------------------------   9:27 PM on 08/25/2016 -----------------------------------------   Blood pressure (!) 144/88, pulse 71, temperature 97.8 F (36.6 C), temperature source Axillary, resp. rate 16, height 5\' 7"  (1.702 m), weight 72.6 kg (160 lb), SpO2 100 %.  Patient is refusing repeat specialist on call consultation. Psychiatric consultation ordered.   Myrna BlazerSchaevitz, Jinelle Butchko Matthew, MD 08/25/16 2127

## 2016-08-25 NOTE — ED Notes (Signed)
ED BHU PLACEMENT JUSTIFICATION Is the patient under IVC or is there intent for IVC: Yes.   Is the patient medically cleared: Yes.   Is there vacancy in the ED BHU: Yes.   Is the population mix appropriate for patient: Yes.   Is the patient awaiting placement in inpatient or outpatient setting: Yes.   Has the patient had a psychiatric consult: Yes.   Survey of unit performed for contraband, proper placement and condition of furniture, tampering with fixtures in bathroom, shower, and each patient room: Yes.   APPEARANCE/BEHAVIOR adequate rapport can be established NEURO ASSESSMENT Orientation: place and person Hallucinations: No.None noted (Hallucinations) Speech: Slurred Gait: unsteady RESPIRATORY ASSESSMENT Normal expansion.  Clear to auscultation.  No rales, rhonchi, or wheezing. CARDIOVASCULAR ASSESSMENT regular rate and rhythm, S1, S2 normal, no murmur, click, rub or gallop GASTROINTESTINAL ASSESSMENT soft, nontender, BS WNL, no r/g EXTREMITIES deformities:  Left arm PLAN OF CARE Provide calm/safe environment. Vital signs assessed twice daily. ED BHU Assessment once each 12-hour shift. Collaborate with intake RN daily or as condition indicates. Assure the ED provider has rounded once each shift. Provide and encourage hygiene. Provide redirection as needed. Assess for escalating behavior; address immediately and inform ED provider.  Assess family dynamic and appropriateness for visitation as needed: Yes.   Educate the patient/family about BHU procedures/visitation: Yes.

## 2016-08-25 NOTE — ED Notes (Signed)
Patient asleep in room. No noted distress or abnormal behavior. Will continue 15 minute checks and observation by security cameras for safety. 

## 2016-08-25 NOTE — ED Notes (Signed)
Referral information for Psychiatric Hospitalization faxed to;     [facility] [phone] or [phone]   [facility] [phone]),    [facility] [phone]),    [facility] [phone]),    [facility] [phone]),    [facility] [phone])

## 2016-08-25 NOTE — ED Notes (Addendum)
Pt unwilling to cooperate with SOC. Pt initially agreed, but had since fallen asleep. Pt not waiting to be disturbed.

## 2016-08-25 NOTE — ED Notes (Signed)
Pt refusing medication and VS, but no disruptive behavior. Maintained on 15 minute checks and observation by security camera for safety.

## 2016-08-25 NOTE — ED Notes (Signed)
PT IVC/ SOC CAN NOT BE COMPLETED. DR. Pershing ProudSCHAEVITZ WAS MADE AWARE

## 2016-08-25 NOTE — ED Notes (Signed)
Pt taking a shower 

## 2016-08-25 NOTE — Progress Notes (Signed)
MEDICATION RELATED CONSULT NOTE - INITIAL   Pharmacy Consult for Clozapine Indication: ANC monitoring  No Known Allergies  Patient Measurements: Height: 5\' 7"  (170.2 cm) Weight: 160 lb (72.6 kg) IBW/kg (Calculated) : 66.1  Vital Signs: Temp: 97.8 F (36.6 C) (08/04 2259) Temp Source: Axillary (08/04 2259) BP: 111/55 (08/05 0701) Pulse Rate: 69 (08/05 0701) Intake/Output from previous day: No intake/output data recorded. Intake/Output from this shift: No intake/output data recorded.  Labs:  Recent Labs  08/24/16 1748  WBC 10.3  HGB 14.1  HCT 40.3  PLT 239  CREATININE 0.86   Estimated Creatinine Clearance: 91.8 mL/min (by C-G formula based on SCr of 0.86 mg/dL).   Assessment: 56 yo male continuing on clozapine 100 mg qAM and 400 mg qHS from PTA.   8/4 ANC 6.7  Goal of Therapy:  ANC WNL  Plan:  ANC submitted to clozapine registry Pt eligible to receive clozapine Next labs due 08/31/16 - ordered  Marty HeckWang, Baby Gieger L 08/25/2016,7:44 AM

## 2016-08-25 NOTE — ED Notes (Signed)
Pt given breakfast tray and drink 

## 2016-08-25 NOTE — ED Provider Notes (Signed)
-----------------------------------------   6:19 AM on 08/25/2016 -----------------------------------------   Blood pressure 98/61, pulse 92, temperature 97.8 F (36.6 C), temperature source Axillary, resp. rate 16, height 5\' 7"  (1.702 m), weight 72.6 kg (160 lb), SpO2 97 %.  The patient had no acute events since last update.  Calm and cooperative at this time.  Disposition is pending Psychiatry/Behavioral Medicine team recommendations.     Merrily Brittleifenbark, Nisreen Guise, MD 08/25/16 [id number]

## 2016-08-25 NOTE — ED Notes (Addendum)
Pt in dayroom, appears to be responding to internal stimuli. Pt concerned that since his mother has passed there is no one to give him a ride home. Pt also worried "no one knows I am here."  Pt offered reassurance.  Pt calm and able to be re-directed at this time. Maintained on 15 minute checks and observation by security camera for safety.
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
## 2016-08-25 NOTE — ED Notes (Signed)
Pt asked if he would allow lab work. "I am resting now, maybe later."  RN will follow up with patient later this morning. Maintained on 15 minute checks and observation by security camera for safety.

## 2016-08-25 NOTE — ED Notes (Signed)
Patient resting quietly in room. No noted distress or abnormal behaviors noted. Will continue 15 minute checks and observation by security camera for safety. 

## 2016-08-26 DIAGNOSIS — F203 Undifferentiated schizophrenia: Secondary | ICD-10-CM

## 2016-08-26 DIAGNOSIS — F919 Conduct disorder, unspecified: Secondary | ICD-10-CM | POA: Diagnosis not present

## 2016-08-26 NOTE — ED Notes (Signed)

## 2016-08-26 NOTE — Progress Notes (Signed)
Patient does have a care coordinator- Santiago BurGeorgette Harper [phone] . CSW spoke with Santiago BurGeorgette Harper and provided her an update on Patient's case. Georgette has agreed to contact Patient's ACTT Team and assist with trying to find new placement for Patient.    Enos FlingAshley Ciel Yanes, MSW, LCSW [facility] Clinical Social Worker [phone]

## 2016-08-26 NOTE — ED Notes (Signed)
Pt. Alert and oriented, warm and dry, in no distress. Pt. Denies SI, HI, and AVH. Pt. Encouraged to let nursing staff know of any concerns or needs. 

## 2016-08-26 NOTE — ED Notes (Signed)
Sandwich and soft drink given.  

## 2016-08-26 NOTE — ED Notes (Signed)
Pt. Alert and oriented, warm and dry, in no distress. Pt. Denies SI, HI, and AVH. Pt states his momma died 4 months ago, and how it is a shame she is gone. Pt. Encouraged to let nursing staff know of any concerns or needs.
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
## 2016-08-26 NOTE — ED Notes (Signed)
PT IVC/ PENDING PLACEMENT  

## 2016-08-26 NOTE — ED Notes (Signed)
Patient is still pacing at times, but is quiet , not asking questions repeatedly, Patient is safe, q 15 minute checks and camera surveillance in progress for safety at this time.

## 2016-08-26 NOTE — ED Notes (Signed)
Patient is pacing more, and keeps asking for nurse and then states " They trying to do me in, and my mama is dead and I have no ride home" nurse explained to him that He had not been discharged as of yet, and that social worker would be talking to him also about transportation and discharge when the doctor thought that He was ready for discharge. Patient with anxiety noted, nurse going to administered prn medication for anxiety. Patient with q 15 minute checks and camera surveillance in progress for safety.

## 2016-08-26 NOTE — ED Notes (Signed)
Patient pacing, but is quiet and cooperative, patient with q 15 minute checks, no signs of distress.

## 2016-08-26 NOTE — ED Notes (Signed)
Patient ate 100% of breakfast and beverage.  

## 2016-08-26 NOTE — ED Notes (Signed)
Patient ate 100% of lunch and beverage.  

## 2016-08-26 NOTE — Consult Note (Signed)
Jeffrey [name]   Reason for [name]:  [name] for 56 year old man with schizophrenia brought from his group home because of aggressive behavior Referring Physician:  Paduchowski Patient Identification: Jeffrey [name] MRN:  [id number] Principal Diagnosis: <principal problem not specified> Diagnosis:   Patient Active Problem List   Diagnosis Date Noted  . Dyslipidemia [E78.5] 05/27/2016  . Constipation [K59.00] 05/27/2016  . 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 Noncompliance [Z91.19] 02/15/2016  . Tobacco use disorder [F17.200] 06/30/2014  . Hypertension [I10] 06/29/2014    Total Time spent with patient: 1 hour  Subjective:   Jeffrey [name] is a 56 y.o. male patient admitted with "my mother died".  HPI:  Patient interviewed chart reviewed. Patient well known from previous encounters. 56 year old man with schizophrenia. An aunt from his group home with reports that he has been increasingly aggressive and appearing to be psychotic. He has been talking to himself looking more agitated and starting to frighten other residents. Got into a verbal disagreement and may have escalated to something physical. The patient himself is not a very clear historian. At his best he is disorganized in his thinking. He denies that he hit anyone or has any thoughts of hurting anyone else. He says that he sleeps okay and eats okay. Doesn't have any specific complaints for me. Claims that he is compliant with his medicine. He clearly does not like the group home although the reasons for I vague.  Social history: Patient has schizophrenia and has been very sick for many decades. For a long time he was Fairly stable by the presence and constant attention of his mother. She died within the last year which has left him very much adrift. He now has a new guardian. Doesn't have any safe place to stay except for this group home.  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  Substance abuse history: Does not drink or abuse drugs doesn't have a significant substance abuse history  Past Psychiatric History: Long-standing history of undifferentiated or disorganized schizophrenia. When psychotic has done things such as cut his own handoff. Has been on multiple medications with some eventual response to clozapine and Depakote. He was just discharged from our hospital in June of this year after a lengthy hospital stay. Does have a history of some aggression especially when psychotic  Risk to Self: Suicidal Ideation:  (Unable to assess (UTA)) Suicidal Intent: No Is patient at risk for suicide?: No (Unable to assess (UTA)) Suicidal Plan?: No (Unable to assess (UTA)) Access to Means: No How many times?:  (Unable to assess (UTA)) Other Self Harm Risks:  (Unable to assess (UTA)) Triggers for Past Attempts: Unknown (Unable to assess (UTA)) Intentional Self Injurious Behavior: None (Unable to assess (UTA)) Risk to Others: Homicidal Ideation: No (Unable to assess (UTA)) Thoughts of Harm to Others: No (Unable to assess (UTA)) Current Homicidal Intent: No (Unable to assess (UTA)) Current Homicidal Plan: No (Unable to assess (UTA)) Access to Homicidal Means: No (Unable to assess (UTA)) History of harm to others?: No (Unable to assess (UTA)) Assessment of Violence: None Noted Violent Behavior Description:  (Unable to assess (UTA)) Does patient have access to weapons?: No (Unable to assess (UTA)) Criminal Charges Pending?: No (Unable to assess (UTA)) Does patient have a court date: No (Unable to assess (UTA)) Prior Inpatient Therapy: Prior Inpatient Therapy: Yes (Unable to assess (UTA)) Prior Therapy Dates: 05/2016 (Unable to assess (UTA))  Prior Therapy Facilty/Provider(s): [facility] (Unable to assess (UTA)) Reason for Treatment: Behavioral Health Prior Outpatient Therapy: Prior Outpatient Therapy: Yes Prior  Therapy Dates: Current Prior Therapy Facilty/Provider(s):  (ACCT Team) Reason for Treatment:  ([place]) Does patient have an ACCT team?: Yes Does patient have Intensive In-House Services?  : No Does patient have [facility] services? : No Does patient have P4CC services?: No  Past Medical History:  Past Medical History:  Diagnosis Date  . Hypertension   . Schizophrenia [facility])     Past Surgical History:  Procedure Laterality Date  . 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 No pertinent family history. Family Psychiatric  History: Unknown Social History:  History  Alcohol Use No     History  Drug Use No    Social History   Social History  . Marital status: Single    Spouse name: N/A  . Number of children: N/A  . Years of education: N/A   Social History Main Topics  . Smoking status: Current Every Day Smoker    Packs/day: 1.00    Types: Cigarettes  . Smokeless tobacco: Current User  . Alcohol use No  . Drug use: No  . Sexual activity: Not Asked   Other Topics Concern  . None   Social History Narrative  . None   Additional Social History:    Allergies:  No Known Allergies  Labs:  Results for orders placed or performed during the hospital encounter of 08/24/16 (from the past 48 hour(s))  CBC with Differential     Status: Abnormal   Collection Time: 08/24/16  5:48 PM  Result Value Ref Range   WBC 10.3 3.8 - 10.6 K/uL   RBC 4.50 4.40 - 5.90 MIL/uL   Hemoglobin 14.1 13.0 - 18.0 g/dL   HCT 40.3 40.0 - 52.0 %   MCV 89.6 80.0 - 100.0 fL   MCH 31.3 26.0 - 34.0 pg   MCHC 35.0 32.0 - 36.0 g/dL   RDW 15.0 (H) 11.5 - 14.5 %   Platelets 239 150 - 440 K/uL   Neutrophils Relative % 65 %   Neutro Abs 6.7 (H) 1.4 - 6.5 K/uL   Lymphocytes Relative 25 %   Lymphs Abs 2.6 1.0 - 3.6 K/uL   Monocytes Relative 9 %   Monocytes Absolute 0.9 0.2 - 1.0 K/uL   Eosinophils Relative 0 %   Eosinophils Absolute  0.0 0 - 0.7 K/uL   Basophils Relative 1 %   Basophils Absolute 0.1 0 - 0.1 K/uL  Basic metabolic panel     Status: Abnormal   Collection Time: 08/24/16  5:48 PM  Result Value Ref Range   Sodium 135 135 - 145 mmol/L   Potassium 4.0 3.5 - 5.1 mmol/L   Chloride 102 101 - 111 mmol/L   CO2 24 22 - 32 mmol/L   Glucose, Bld 145 (H) 65 - 99 mg/dL   BUN 16 6 - 20 mg/dL   Creatinine, Ser 0.86 0.61 - 1.24 mg/dL   Calcium 9.1 8.9 - 10.3 mg/dL   GFR calc non Af Amer >60 >60 mL/min   GFR calc Af Amer >60 >60 mL/min    Comment: (NOTE) The eGFR has been calculated using the CKD EPI equation. This calculation has not been validated in all clinical situations. eGFR's persistently <60 mL/min signify possible Chronic Kidney Disease.    Anion gap 9 5 - 15  Urine Drug  Screen, Qualitative ([place] only)     Status: Abnormal   Collection Time: 08/24/16  8:14 PM  Result Value Ref Range   Tricyclic, Ur Screen NONE DETECTED NONE DETECTED   Amphetamines, Ur Screen NONE DETECTED NONE DETECTED   MDMA (Ecstasy)Ur Screen NONE DETECTED NONE DETECTED   Cocaine Metabolite,Ur [place] NONE DETECTED NONE DETECTED   Opiate, Ur Screen NONE DETECTED NONE DETECTED   Phencyclidine (PCP) Ur S NONE DETECTED NONE DETECTED   Cannabinoid 50 Ng, Ur [place] NONE DETECTED NONE DETECTED   Barbiturates, Ur Screen NONE DETECTED NONE DETECTED   Benzodiazepine, Ur Scrn POSITIVE (A) NONE DETECTED   Methadone Scn, Ur NONE DETECTED NONE DETECTED    Comment: (NOTE) 814  Tricyclics, urine               Cutoff 1000 ng/mL 200  Amphetamines, urine             Cutoff 1000 ng/mL 300  MDMA (Ecstasy), urine           Cutoff 500 ng/mL 400  Cocaine Metabolite, urine       Cutoff 300 ng/mL 500  Opiate, urine                   Cutoff 300 ng/mL 600  Phencyclidine (PCP), urine      Cutoff 25 ng/mL 700  Cannabinoid, urine              Cutoff 50 ng/mL 800  Barbiturates, urine             Cutoff 200 ng/mL 900  Benzodiazepine, urine           Cutoff 200  ng/mL 1000 Methadone, urine                Cutoff 300 ng/mL 1100 1200 The urine drug screen provides only a preliminary, unconfirmed 1300 analytical test result and should not be used for non-medical 1400 purposes. Clinical consideration and professional judgment should 1500 be applied to any positive drug screen result due to possible 1600 interfering substances. A more specific alternate chemical method 1700 must be used in order to obtain a confirmed analytical result.  1800 Gas chromato graphy / mass spectrometry (GC/MS) is the preferred 1900 confirmatory method.     Current Facility-Administered Medications  Medication Dose Route Frequency Provider Last Rate Last Dose  . cloZAPine (CLOZARIL) tablet 100 mg  100 mg Oral Daily Darel Hong, MD   100 mg at 08/26/16 1017   And  . cloZAPine (CLOZARIL) tablet 400 mg  400 mg Oral QHS Darel Hong, MD   400 mg at 08/25/16 2058  . desmopressin (DDAVP) tablet 0.2 mg  0.2 mg Oral QHS Darel Hong, MD   0.2 mg at 08/25/16 2059  . divalproex (DEPAKOTE) DR tablet 1,000 mg  1,000 mg Oral Q12H Darel Hong, MD   1,000 mg at 08/26/16 0802  . docusate sodium (COLACE) capsule 200 mg  200 mg Oral BID Darel Hong, MD   200 mg at 08/26/16 1026  . FLUoxetine (PROZAC) capsule 10 mg  10 mg Oral Daily Darel Hong, MD   10 mg at 08/26/16 1017  . ipratropium (ATROVENT) 0.03 % nasal spray 2 spray  2 spray Nasal QHS Darel Hong, MD   2 spray at 08/25/16 2102  . LORazepam (ATIVAN) tablet 1 mg  1 mg Oral Q6H PRN Schaevitz, Randall An, MD   1 mg at 08/25/16 2057  . metFORMIN (GLUCOPHAGE) tablet 500 mg  500 mg  Oral BID WC Darel Hong, MD   500 mg at 08/26/16 0802  . metoprolol tartrate (LOPRESSOR) tablet 12.5 mg  12.5 mg Oral BID Darel Hong, MD   12.5 mg at 08/26/16 1018  . mometasone-formoterol (DULERA) 200-5 MCG/ACT inhaler 2 puff  2 puff Inhalation BID Darel Hong, MD   2 puff at 08/26/16 1018  . polyethylene glycol (MIRALAX  / GLYCOLAX) packet 17 g  17 g Oral Daily Darel Hong, MD   17 g at 08/25/16 1045  . senna (SENOKOT) tablet 17.2 mg  2 tablet Oral QHS Darel Hong, MD   17.2 mg at 08/25/16 2059  . simvastatin (ZOCOR) tablet 40 mg  40 mg Oral QHS Darel Hong, MD   40 mg at 08/25/16 2058  . temazepam (RESTORIL) capsule 15 mg  15 mg Oral QHS Darel Hong, MD   15 mg at 08/25/16 2100   Current Outpatient Prescriptions  Medication Sig Dispense Refill  . budesonide-formoterol (SYMBICORT) 160-4.5 MCG/ACT inhaler Inhale 2 puffs into the lungs 2 (two) times daily.    . cloZAPine (CLOZARIL) 100 MG tablet Take 1-4 tablets (100-400 mg total) by mouth 2 (two) times daily. Patient takes 1 tablet (100 mg) in the morning and 4 tablets (400 mg) at bedtime. 150 tablet 1  . divalproex (DEPAKOTE) 500 MG DR tablet Take 2 tablets (1,000 mg total) by mouth every 12 (twelve) hours. 120 tablet 1  . docusate sodium (COLACE) 100 MG capsule Take 2 capsules (200 mg total) by mouth 2 (two) times daily. 60 capsule 1  . FLUoxetine (PROZAC) 10 MG capsule Take 1 capsule (10 mg total) by mouth daily. 30 capsule 1  . metFORMIN (GLUCOPHAGE) 500 MG tablet Take 1 tablet (500 mg total) by mouth 2 (two) times daily with a meal. 60 tablet 1  . metoprolol tartrate (LOPRESSOR) 25 MG tablet Take 0.5 tablets (12.5 mg total) by mouth 2 (two) times daily. 15 tablet 1  . polyethylene glycol (MIRALAX / GLYCOLAX) packet Take 17 g by mouth daily. 30 packet 1  . temazepam (RESTORIL) 15 MG capsule Take 1 capsule (15 mg total) by mouth at bedtime. 30 capsule 1  . desmopressin (DDAVP) 0.2 MG tablet Take 1 tablet (0.2 mg total) by mouth at bedtime. 30 tablet 1  . ipratropium (ATROVENT) 0.03 % nasal spray Place 2 sprays into the nose at bedtime. 30 mL 12  . senna (SENOKOT) 8.6 MG TABS tablet Take 2 tablets (17.2 mg total) by mouth at bedtime. 60 each 1  . simvastatin (ZOCOR) 40 MG tablet Take 1 tablet (40 mg total) by mouth at bedtime. 30 tablet 1     Musculoskeletal: Strength & Muscle Tone: within normal limits Gait & Station: normal Patient leans: N/A  Psychiatric Specialty Exam: Physical Exam  Nursing note and vitals reviewed. Constitutional: He appears well-developed and well-nourished.  HENT:  Head: Normocephalic and atraumatic.  Eyes: Pupils are equal, round, and reactive to light. Conjunctivae are normal.  Neck: Normal range of motion.  Cardiovascular: Normal heart sounds.   Respiratory: Effort normal.  GI: Soft.  Musculoskeletal: Normal range of motion.       Arms: Neurological: He is alert.  Skin: Skin is warm and dry.  Psychiatric: His mood appears anxious. His affect is blunt. His speech is delayed and tangential. He is withdrawn. Thought content is paranoid. Cognition and memory are impaired. He expresses impulsivity and inappropriate judgment. He expresses no homicidal and no suicidal ideation. He exhibits abnormal recent memory.    Review  of Systems  Constitutional: Negative.   HENT: Negative.   Eyes: Negative.   Respiratory: Negative.   Cardiovascular: Negative.   Gastrointestinal: Negative.   Musculoskeletal: Negative.   Skin: Negative.   Neurological: Negative.   Psychiatric/Behavioral: Negative for depression, hallucinations, memory loss, substance abuse and suicidal ideas. The patient is nervous/anxious. The patient does not have insomnia.     Blood pressure 100/61, pulse 84, temperature 98.2 F (36.8 C), temperature source Oral, resp. rate 16, height 5' 7"  (1.702 m), weight 72.6 kg (160 lb), SpO2 99 %.Body mass index is 25.06 kg/m.  General Appearance: Disheveled  Eye Contact:  Fair  Speech:  Slow  Volume:  Decreased  Mood:  Euthymic  Affect:  Constricted  Thought Process:  Disorganized  Orientation:  Full (Time, Place, and Person)  Thought Content:  Illogical, Rumination and Tangential  Suicidal Thoughts:  No  Homicidal Thoughts:  No  Memory:  Immediate;   Good Recent;   Fair Remote;    Fair  Judgement:  Impaired  Insight:  Lacking  Psychomotor Activity:  Decreased  Concentration:  Concentration: Poor  Recall:  [employer] of Knowledge:  Fair  Language:  Fair  Akathisia:  No  Handed:  Right  AIMS (if indicated):     Assets:  Desire for Improvement Housing Resilience  ADL's:  Intact  Cognition:  Impaired,  Mild  Sleep:        Treatment Plan Summary: Medication management and Plan 56 year old man with schizophrenia. To my examination today he appears to be pretty much at his baseline. Jeffrey [name] is never anything but confused to some degree. Even with the best medication and a long period of stabilization he is prone to mood swings confusion and paranoia and obvious psychosis. He has not been violent aggressive or threatening here in the emergency room. He says he was compliant with his medicine. Unfortunately no Depakote level was drawn when he first came into the hospital so I don't have any way to judge that for sure. I have reviewed the situation with TTS and nursing and social work. I am inclined to recommend that we try releasing him back to his group home again. It's not clear to me that staying in the hospital is going to make any real difference to his condition. For now we are continuing his usual outpatient medicine. Social work is contacting the group home to see whether they will be agreeable to the plan.  Disposition: No evidence of imminent risk to self or others at present.   Supportive therapy provided about ongoing stressors. Discussed crisis plan, support from social network, calling 911, coming to the Emergency Department, and calling Suicide Hotline.  Alethia Berthold, MD 08/26/2016 3:45 PM

## 2016-08-26 NOTE — ED Provider Notes (Signed)
-----------------------------------------   7:27 AM on 08/26/2016 -----------------------------------------   Blood pressure (!) 144/88, pulse 71, temperature 97.8 F (36.6 C), temperature source Axillary, resp. rate 16, height 5\' 7"  (1.702 m), weight 72.6 kg (160 lb), SpO2 100 %.  The patient had no acute events since last update.  Calm and cooperative at this time.  Patient awaiting a bed and acceptance to a facility.     Rebecka ApleyWebster, Allison P, MD 08/26/16 [phone]

## 2016-08-27 DIAGNOSIS — F203 Undifferentiated schizophrenia: Secondary | ICD-10-CM | POA: Diagnosis not present

## 2016-08-27 DIAGNOSIS — F919 Conduct disorder, unspecified: Secondary | ICD-10-CM | POA: Diagnosis not present

## 2016-08-27 NOTE — NC FL2 (Signed)
[place] MEDICAID FL2 LEVEL OF CARE SCREENING TOOL     IDENTIFICATION  Patient Name: Jeffrey [name] Birthdate: 21-Jun-1960 Sex: male Admission Date (Current Location): 08/24/2016  [place] and [place] Number:  [address] [id number] [facility] Facility and Address:  [facility], [address], [place], [place] [address]      Provider Number: [id number]  Attending Physician Name and Address:  No att. providers found  Relative Name and Phone Number:  Corwin Levins (Legal Guardian) [phone]; [phone]. [phone]    Current Level of Care: Hospital Recommended Level of Care: [facility] Prior Approval Number:    Date Approved/Denied:   PASRR Number: [id number]  Discharge Plan: Other (Comment) [facility] )    Current Diagnoses: Patient Active Problem List   Diagnosis Date Noted  . Dyslipidemia 05/27/2016  . Constipation 05/27/2016  . Schizophrenia, undifferentiated (HCC) 05/27/2016  . Noncompliance 02/15/2016  . Tobacco use disorder 06/30/2014  . Hypertension 06/29/2014    Orientation RESPIRATION BLADDER Height & Weight     Self, Place  Normal Continent Weight: 160 lb (72.6 kg) Height:  5\' 7"  (170.2 cm)  BEHAVIORAL SYMPTOMS/MOOD NEUROLOGICAL BOWEL NUTRITION STATUS  Other (Comment) (Has had altercations with others )   Continent Diet (Low Sodium; Carb Modified)  AMBULATORY STATUS COMMUNICATION OF NEEDS Skin   Independent Verbally Normal                       Personal Care Assistance Level of Assistance  Bathing, Feeding, Dressing Bathing Assistance: Independent Feeding assistance: Independent Dressing Assistance: Independent     Functional Limitations Info  Sight, Hearing, Speech Sight Info: Adequate Hearing Info: Adequate Speech Info: Adequate    SPECIAL CARE FACTORS FREQUENCY                       Contractures Contractures Info: Not present    Additional Factors Info  Code Status, Allergies, Psychotropic Code  Status Info: FULL  Allergies Info: No Known Allergies Psychotropic Info: Clozaril; Depakote; Prozac; Ativan         Current Medications (08/27/2016):  This is the current hospital active medication list Current Facility-Administered Medications  Medication Dose Route Frequency Provider Last Rate Last Dose  . cloZAPine (CLOZARIL) tablet 100 mg  100 mg Oral Daily Merrily Brittle, MD   100 mg at 08/26/16 1017   And  . cloZAPine (CLOZARIL) tablet 400 mg  400 mg Oral QHS Merrily Brittle, MD   400 mg at 08/26/16 2241  . desmopressin (DDAVP) tablet 0.2 mg  0.2 mg Oral QHS Merrily Brittle, MD   0.2 mg at 08/26/16 2241  . divalproex (DEPAKOTE) DR tablet 1,000 mg  1,000 mg Oral Q12H Merrily Brittle, MD   1,000 mg at 08/26/16 2241  . docusate sodium (COLACE) capsule 200 mg  200 mg Oral BID Merrily Brittle, MD   200 mg at 08/26/16 2241  . FLUoxetine (PROZAC) capsule 10 mg  10 mg Oral Daily Merrily Brittle, MD   10 mg at 08/26/16 1017  . ipratropium (ATROVENT) 0.03 % nasal spray 2 spray  2 spray Nasal QHS Merrily Brittle, MD   2 spray at 08/26/16 2241  . LORazepam (ATIVAN) tablet 1 mg  1 mg Oral Q6H PRN Schaevitz, Myra Rude, MD   1 mg at 08/26/16 2241  . metFORMIN (GLUCOPHAGE) tablet 500 mg  500 mg Oral BID WC Merrily Brittle, MD   500 mg at 08/26/16 1639  . metoprolol tartrate (LOPRESSOR)  tablet 12.5 mg  12.5 mg Oral BID Merrily Brittleifenbark, Neil, MD   12.5 mg at 08/26/16 2242  . mometasone-formoterol (DULERA) 200-5 MCG/ACT inhaler 2 puff  2 puff Inhalation BID Merrily Brittleifenbark, Neil, MD   2 puff at 08/26/16 2240  . polyethylene glycol (MIRALAX / GLYCOLAX) packet 17 g  17 g Oral Daily Merrily Brittleifenbark, Neil, MD   17 g at 08/25/16 1045  . senna (SENOKOT) tablet 17.2 mg  2 tablet Oral QHS Merrily Brittleifenbark, Neil, MD   17.2 mg at 08/26/16 2241  . simvastatin (ZOCOR) tablet 40 mg  40 mg Oral QHS Merrily Brittleifenbark, Neil, MD   40 mg at 08/26/16 2241  . temazepam (RESTORIL) capsule 15 mg  15 mg Oral QHS Merrily Brittleifenbark, Neil, MD   15 mg at 08/26/16 2241    Current Outpatient Prescriptions  Medication Sig Dispense Refill  . budesonide-formoterol (SYMBICORT) 160-4.5 MCG/ACT inhaler Inhale 2 puffs into the lungs 2 (two) times daily.    . cloZAPine (CLOZARIL) 100 MG tablet Take 1-4 tablets (100-400 mg total) by mouth 2 (two) times daily. Patient takes 1 tablet (100 mg) in the morning and 4 tablets (400 mg) at bedtime. 150 tablet 1  . divalproex (DEPAKOTE) 500 MG DR tablet Take 2 tablets (1,000 mg total) by mouth every 12 (twelve) hours. 120 tablet 1  . docusate sodium (COLACE) 100 MG capsule Take 2 capsules (200 mg total) by mouth 2 (two) times daily. 60 capsule 1  . FLUoxetine (PROZAC) 10 MG capsule Take 1 capsule (10 mg total) by mouth daily. 30 capsule 1  . metFORMIN (GLUCOPHAGE) 500 MG tablet Take 1 tablet (500 mg total) by mouth 2 (two) times daily with a meal. 60 tablet 1  . metoprolol tartrate (LOPRESSOR) 25 MG tablet Take 0.5 tablets (12.5 mg total) by mouth 2 (two) times daily. 15 tablet 1  . polyethylene glycol (MIRALAX / GLYCOLAX) packet Take 17 g by mouth daily. 30 packet 1  . temazepam (RESTORIL) 15 MG capsule Take 1 capsule (15 mg total) by mouth at bedtime. 30 capsule 1  . desmopressin (DDAVP) 0.2 MG tablet Take 1 tablet (0.2 mg total) by mouth at bedtime. 30 tablet 1  . ipratropium (ATROVENT) 0.03 % nasal spray Place 2 sprays into the nose at bedtime. 30 mL 12  . senna (SENOKOT) 8.6 MG TABS tablet Take 2 tablets (17.2 mg total) by mouth at bedtime. 60 each 1  . simvastatin (ZOCOR) 40 MG tablet Take 1 tablet (40 mg total) by mouth at bedtime. 30 tablet 1     Discharge Medications: Please see discharge summary for a list of discharge medications.  Relevant Imaging Results:  Relevant Lab Results:   Additional Information SS #: [id number]  Lew DawesAshley N Nik Gorrell, LCSW
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
## 2016-08-27 NOTE — ED Notes (Signed)
Patient went to restroom and was stumbling, this Clinical research associatewriter helped patient into restroom. Will continue to monitor.

## 2016-08-27 NOTE — ED Provider Notes (Signed)
-----------------------------------------   6:48 AM on 08/27/2016 -----------------------------------------   Blood pressure (!) 134/95, pulse 77, temperature (!) 97.5 F (36.4 C), temperature source Oral, resp. rate 16, height 5\' 7"  (1.702 m), weight 72.6 kg (160 lb), SpO2 100 %.  The patient had no acute events since last update.  Calm and cooperative at this time.  Disposition is pending Psychiatry/Behavioral Medicine team recommendations.     Merrily Brittleifenbark, Zackarey Holleman, MD 08/27/16 [phone]

## 2016-08-27 NOTE — ED Notes (Signed)
Pt. Alert and oriented, warm and dry, in no distress. Pt. Denies SI, HI, and AVH. Pt. Encouraged to let nursing staff know of any concerns or needs. 

## 2016-08-27 NOTE — ED Notes (Signed)
Pt offer his Metformin at 1700 Pt refused. Pt reoffer medication again at 1800 and refused. Pt is focus on getting in contact with his family members. Will cont to monitor pt.

## 2016-08-27 NOTE — Progress Notes (Signed)
CSW has made the following FCH/group home referrals:   [facility]  [facility]  [facility]  [facility]  [facility]  [address] [facility] Patrice Matthew's [facility] Home B and N Family [facility] Home  [facility]  [facility]  [facility]  [facility]  [facility]  [facility]  [facility]  [facility]  Logan BoresEvans Forever Young [facility] Home  [facility]  [address] Family Care  Guardian [place]  Gwen's [facility] Home II  [facility] L [facility] S [facility] 2  [place] [facility]'s [facility] Home #2 [facility] [place]  [place] [facility]  Your [facility]     Enos FlingAshley Shamia Uppal, MSW, LCSW [facility] Clinical Social Worker [phone]
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
## 2016-08-28 DIAGNOSIS — F919 Conduct disorder, unspecified: Secondary | ICD-10-CM | POA: Diagnosis not present

## 2016-08-28 NOTE — ED Notes (Signed)
BEHAVIORAL HEALTH ROUNDING Patient sleeping: Yes.   Patient alert and oriented: not applicable SLEEPING Behavior appropriate: Yes.  ; If no, describe: SLEEPING Nutrition and fluids offered: No SLEEPING Toileting and hygiene offered: NoSLEEPING Sitter present: not applicable, Q 15 min safety rounds and observation via security camera. Law enforcement present: Yes ODS 

## 2016-08-28 NOTE — Progress Notes (Signed)
CSW contacted Patient's care coordinator, Santiago BurGeorgette Harper [phone]), regarding placement concerns. Ms. Clearance CootsHarper instructed CSW to give Shelton SilvasAlfred Miller (Housing Specialist on ACTT) a phone call for further assistance with placement. CSW asked that Ms. Clearance CootsHarper also assist with Placement needs. CSW to ACTT a call.    Enos FlingAshley Race Latour, MSW, LCSW [facility] Clinical Social Worker [phone]

## 2016-08-28 NOTE — ED Notes (Addendum)
Pt compliant with morning medications. Calm and cooperative with staff.  Pt wanting to rest longer. No needs or concerns at this time. Maintained on 15 minute checks and observation by security camera for safety.

## 2016-08-28 NOTE — ED Notes (Signed)
BEHAVIORAL HEALTH ROUNDING  Patient sleeping: No.  Patient alert and oriented: yes  Behavior appropriate: Yes. ; If no, describe:  Nutrition and fluids offered: Yes  Toileting and hygiene offered: Yes  Sitter present: not applicable, Q 15 min safety rounds and observation via security camera. Law enforcement present: Yes ODS  ED BHU PLACEMENT JUSTIFICATION  Is the patient under IVC or is there intent for IVC: Yes.  Is the patient medically cleared: Yes.  Is there vacancy in the ED BHU: Yes.  Is the population mix appropriate for patient: Yes.  Is the patient awaiting placement in inpatient or outpatient setting: Yes.  Has the patient had a psychiatric consult: Yes.  Survey of unit performed for contraband, proper placement and condition of furniture, tampering with fixtures in bathroom, shower, and each patient room: Yes. ; Findings: All clear  APPEARANCE/BEHAVIOR  calm, cooperative and adequate rapport can be established  NEURO ASSESSMENT  Orientation: time, place and person  Hallucinations: No.None noted (Hallucinations)  Speech: Normal  Gait: normal  RESPIRATORY ASSESSMENT  WNL  CARDIOVASCULAR ASSESSMENT  WNL  GASTROINTESTINAL ASSESSMENT  WNL  EXTREMITIES  WNL (at baseline, pt has left arm amputation at mid forearm) PLAN OF CARE  Provide calm/safe environment. Vital signs assessed TID. ED BHU Assessment once each 12-hour shift. Collaborate with TTS daily or as condition indicates. Assure the ED provider has rounded once each shift. Provide and encourage hygiene. Provide redirection as needed. Assess for escalating behavior; address immediately and inform ED provider.  Assess family dynamic and appropriateness for visitation as needed: Yes. ; If necessary, describe findings:  Educate the patient/family about BHU procedures/visitation: Yes. ; If necessary, describe findings: Pt is calm and cooperative at this time. Pt understanding and accepting of unit procedures/rules. Will  continue to monitor with Q 15 min safety rounds and observation via security camera.

## 2016-08-28 NOTE — ED Provider Notes (Signed)
-----------------------------------------   4:03 AM on 08/28/2016 -----------------------------------------   Blood pressure 124/84, pulse 85, temperature 98.5 F (36.9 C), temperature source Oral, resp. rate 18, height 5\' 7"  (1.702 m), weight 72.6 kg (160 lb), SpO2 99 %.  The patient had no acute events since last update.  Calm and cooperative at this time.  Disposition is pending Psychiatry/Behavioral Medicine team recommendations.     Willy Eddyobinson, Lilli Dewald, MD 08/28/16 [phone]

## 2016-08-28 NOTE — ED Notes (Signed)

## 2016-08-28 NOTE — ED Notes (Signed)
Patient resting quietly in room. No noted distress or abnormal behaviors noted. Will continue 15 minute checks and observation by security camera for safety. 

## 2016-08-28 NOTE — ED Notes (Signed)
Pt to nurses station door asking to speak with Dr. Toni Amendlapacs. RN told patient the doctor would speak with him a little later this afternoon. Pt accepting and returned to his room. No disruptive behavior. Maintained on 15 minute checks and observation by security camera for safety.

## 2016-08-28 NOTE — ED Notes (Signed)
ENVIRONMENTAL ASSESSMENT  Potentially harmful objects out of patient reach: Yes.  Personal belongings secured: Yes.  Patient dressed in hospital provided attire only: Yes.  Plastic bags out of patient reach: Yes.  Patient care equipment (cords, cables, call bells, lines, and drains) shortened, removed, or accounted for: Yes.  Equipment and supplies removed from bottom of stretcher: Yes.  Potentially toxic materials out of patient reach: Yes.  Sharps container removed or out of patient reach: Yes.   BEHAVIORAL HEALTH ROUNDING Patient sleeping: Yes.   Patient alert and oriented: not applicable SLEEPING Behavior appropriate: Yes.  ; If no, describe: SLEEPING Nutrition and fluids offered: No SLEEPING Toileting and hygiene offered: NoSLEEPING Sitter present: not applicable, Q 15 min safety rounds and observation via security camera. Law enforcement present: Yes ODS  
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
## 2016-08-28 NOTE — ED Notes (Signed)
Pt has been calm and cooperative. Compliant with VS and medications. No agitation. Pt watching TV in bed. Maintained on 15 minute checks and observation by security camera for safety.

## 2016-08-28 NOTE — ED Notes (Signed)
Pt given sandwich tray. No further needs or concerns. Maintained on 15 minute checks and observation by security camera for safety.

## 2016-08-28 NOTE — ED Notes (Signed)
BEHAVIORAL HEALTH ROUNDING  Patient sleeping: No.  Patient alert and oriented: yes  Behavior appropriate: Yes. ; If no, describe:  Nutrition and fluids offered: Yes  Toileting and hygiene offered: Yes  Sitter present: not applicable, Q 15 min safety rounds and observation via security camera. Law enforcement present: Yes ODS  

## 2016-08-29 DIAGNOSIS — F919 Conduct disorder, unspecified: Secondary | ICD-10-CM | POA: Diagnosis not present

## 2016-08-29 DIAGNOSIS — F203 Undifferentiated schizophrenia: Secondary | ICD-10-CM | POA: Diagnosis not present

## 2016-08-29 NOTE — ED Notes (Signed)
BEHAVIORAL HEALTH ROUNDING Patient sleeping: Yes.   Patient alert and oriented: not applicable SLEEPING Behavior appropriate: Yes.  ; If no, describe: SLEEPING Nutrition and fluids offered: No SLEEPING Toileting and hygiene offered: NoSLEEPING Sitter present: not applicable, Q 15 min safety rounds and observation via security camera. Law enforcement present: Yes ODS 

## 2016-08-29 NOTE — ED Notes (Signed)
Dinner brought to patient 
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
## 2016-08-29 NOTE — ED Notes (Signed)
BEHAVIORAL HEALTH ROUNDING  Patient sleeping: No.  Patient alert and oriented: yes  Behavior appropriate: Yes. ; If no, describe:  Nutrition and fluids offered: Yes  Toileting and hygiene offered: Yes  Sitter present: not applicable, Q 15 min safety rounds and observation via security camera. Law enforcement present: Yes ODS  

## 2016-08-29 NOTE — ED Notes (Addendum)
Pt easily aroused from sleep for breakfast and meds, but requests to wait on meds to sleep longer.

## 2016-08-29 NOTE — Progress Notes (Signed)
CSW has attempted Patient's legal guardian x2. HIPAA compliant voice message left requesting return phone call.    Enos FlingAshley Elek Holderness, MSW, LCSW [facility] Clinical Social Worker [phone]
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
## 2016-08-29 NOTE — ED Notes (Signed)
Lunch brought to patient 

## 2016-08-29 NOTE — ED Notes (Signed)
Pt easily aroused from sleep for breakfast. Pt is calm, cooperative and compliant with meds. No behavioral problems

## 2016-08-29 NOTE — ED Notes (Signed)
Pt slightly agitated- frequently visiting nurse's station- this nurse asked pt if he needed anything to help him with his anxiety, which he replied, 'yes'.

## 2016-08-29 NOTE — ED Notes (Signed)
Patient resting quietly watching tv 

## 2016-08-29 NOTE — ED Notes (Signed)
Patient in shower 

## 2016-08-29 NOTE — ED Provider Notes (Signed)
-----------------------------------------   8:11 AM on 08/29/2016 -----------------------------------------   BP 131/86 (BP Location: Right Arm)   Pulse 74   Temp 98.1 F (36.7 C) (Oral)   Resp 18   Ht 5\' 7"  (1.702 m)   Wt 72.6 kg (160 lb)   SpO2 100%   BMI 25.06 kg/m   No acute events since last update.  Disposition is pending per Psychiatry/Behavioral Medicine team recommendations.     Phineas SemenGoodman, Donoven Pett, MD 08/29/16 [phone]

## 2016-08-29 NOTE — ED Notes (Signed)
Patient approached this nurse apologizing for having a large bowel movement in the shower. Pt states, "I'm so sorry I just donkeyed in the shower. I just couldn't hold it".  Large semi formed feces sitting over drain in shower. Feces removed by this nurse, double bagged and environmental called for shower to be disinfected.

## 2016-08-30 ENCOUNTER — Emergency Department: Payer: Medicare Other

## 2016-08-30 DIAGNOSIS — F919 Conduct disorder, unspecified: Secondary | ICD-10-CM | POA: Diagnosis not present

## 2016-08-30 DIAGNOSIS — F203 Undifferentiated schizophrenia: Secondary | ICD-10-CM | POA: Diagnosis not present

## 2016-08-30 MED ORDER — DESMOPRESSIN ACETATE 0.2 MG PO TABS: 0.2000 mg | ORAL_TABLET | Freq: Every day | ORAL | 1 refills | Status: DC

## 2016-08-30 MED ORDER — LORAZEPAM 1 MG PO TABS: 1.0000 mg | ORAL_TABLET | Freq: Four times a day (QID) | ORAL | 1 refills | Status: DC | PRN

## 2016-08-30 MED ORDER — DOCUSATE SODIUM 100 MG PO CAPS: 200.0000 mg | ORAL_CAPSULE | Freq: Two times a day (BID) | ORAL | 1 refills | Status: DC

## 2016-08-30 MED ORDER — FLUOXETINE HCL 10 MG PO CAPS: 10.0000 mg | ORAL_CAPSULE | Freq: Every day | ORAL | 1 refills | Status: DC

## 2016-08-30 MED ORDER — IPRATROPIUM BROMIDE 0.03 % NA SOLN: 2.0000 | Freq: Every day | NASAL | 1 refills | Status: DC

## 2016-08-30 MED ORDER — DIVALPROEX SODIUM 500 MG PO DR TAB: 1000.0000 mg | DELAYED_RELEASE_TABLET | Freq: Two times a day (BID) | ORAL | 1 refills | Status: DC

## 2016-08-30 MED ORDER — METOPROLOL TARTRATE 25 MG PO TABS: 12.5000 mg | ORAL_TABLET | Freq: Two times a day (BID) | ORAL | 1 refills | Status: DC

## 2016-08-30 MED ORDER — METFORMIN HCL 500 MG PO TABS: 500.0000 mg | ORAL_TABLET | Freq: Two times a day (BID) | ORAL | 1 refills | Status: DC

## 2016-08-30 MED ORDER — SENNA 8.6 MG PO TABS: 2.0000 | ORAL_TABLET | Freq: Every day | ORAL | 1 refills | Status: DC

## 2016-08-30 MED ORDER — POLYETHYLENE GLYCOL 3350 17 G PO PACK: 17.0000 g | PACK | Freq: Every day | ORAL | 1 refills | Status: DC

## 2016-08-30 MED ORDER — MOMETASONE FURO-FORMOTEROL FUM 200-5 MCG/ACT IN AERO: 2.0000 | INHALATION_SPRAY | Freq: Two times a day (BID) | RESPIRATORY_TRACT | 1 refills | Status: DC

## 2016-08-30 MED ORDER — TEMAZEPAM 15 MG PO CAPS: 15.0000 mg | ORAL_CAPSULE | Freq: Every day | ORAL | 1 refills | Status: DC

## 2016-08-30 MED ORDER — CLOZAPINE 100 MG PO TABS: 100.0000 mg | ORAL_TABLET | Freq: Every day | ORAL | 1 refills | Status: DC

## 2016-08-30 MED ORDER — CLOZAPINE 200 MG PO TABS: 400.0000 mg | ORAL_TABLET | Freq: Every day | ORAL | 1 refills | Status: DC

## 2016-08-30 MED ORDER — SIMVASTATIN 40 MG PO TABS: 40.0000 mg | ORAL_TABLET | Freq: Every day | ORAL | 1 refills | Status: DC

## 2016-08-30 NOTE — ED Notes (Signed)
Patient with discharge instructions, no signs of distress, Patient with all belongings given back and he dressed, patient was very glad to be leaving to go to new group home, and ask driver for cigarettes, Patient is calm and and left via car.

## 2016-08-30 NOTE — Progress Notes (Signed)
LCSW called [facility] team  And left message for Jasmine to coordinate patient discharge  LCSW called Lucienne CapersGrace Poole and left voice mail message.  Delta Air LinesClaudine Ethelda Deangelo LCSW [phone]
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
## 2016-08-30 NOTE — Consult Note (Signed)
[facility] Face-to-Face Psychiatry Consult   Reason for Consult:  Consult for 56 year old man with schizophrenia brought from his group home because of aggressive behavior Referring Physician:  Paduchowski Patient Identification: Jeffrey [name] MRN:  [id number] Principal Diagnosis: Schizophrenia, undifferentiated (HCC) Diagnosis:   Patient Active Problem List   Diagnosis Date Noted  . Dyslipidemia [E78.5] 05/27/2016  . Constipation [K59.00] 05/27/2016  . Schizophrenia, undifferentiated (HCC) [F20.3] 05/27/2016  . Noncompliance [Z91.19] 02/15/2016  . Tobacco use disorder [F17.200] 06/30/2014  . Hypertension [I10] 06/29/2014    Total Time spent with patient: 15 minutes  Subjective:   Jeffrey [name] is a 56 y.o. male patient admitted with "my mother died".  Follow-up for 06-13-22 the 06/13/2022. Patient has no new complaints. He has been cooperative and taking care of his basic ADLs. Still psychotic and confused on conversation but not spontaneously agitated. Social work has arranged for appropriate discharge planning for him and the patient is agreeable.  This is a follow-up note for this 56 year old man with schizophrenia. On interview today the patient had no new complaints. He seemed calm and was not currently agitated although this can change from moment to moment. Patient remains disorganized but is able to take care of his basic ADLs with supervision. Not currently making any threats. Medications adequately.  Follow-up for 56 year old man with schizophrenia. Today is Thursday the ninth. Patient is at his baseline mental state. Confused and intermittently slightly agitated but not threatening or violent. Compliant with medicine. No new physical complaints. Still confused and with poor insight and unable to care for himself.  HPI:  Patient interviewed chart reviewed. Patient well known from previous encounters. 56 year old man with schizophrenia. An aunt from his group home with reports that he  has been increasingly aggressive and appearing to be psychotic. He has been talking to himself looking more agitated and starting to frighten other residents. Got into a verbal disagreement and may have escalated to something physical. The patient himself is not a very clear historian. At his best he is disorganized in his thinking. He denies that he hit anyone or has any thoughts of hurting anyone else. He says that he sleeps okay and eats okay. Doesn't have any specific complaints for me. Claims that he is compliant with his medicine. He clearly does not like the group home although the reasons for I vague.  Social history: Patient has schizophrenia and has been very sick for many decades. For a long time he was Fairly stable by the presence and constant attention of his mother. She died within the last year which has left him very much adrift. He now has a new guardian. Doesn't have any safe place to stay except for this group home.  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  Substance abuse history: Does not drink or abuse drugs doesn't have a significant substance abuse history  Past Psychiatric History: Long-standing history of undifferentiated or disorganized schizophrenia. When psychotic has done things such as cut his own handoff. Has been on multiple medications with some eventual response to clozapine and Depakote. He was just discharged from our hospital in June of this year after a lengthy hospital stay. Does have a history of some aggression especially when psychotic  Risk to Self: Suicidal Ideation:  (Unable to assess (UTA)) Suicidal Intent: No Is patient at risk for suicide?: No (Unable to assess (UTA)) Suicidal Plan?: No (Unable to assess (UTA)) Access to Means:  No How many times?:  (Unable to assess (UTA)) Other Self Harm Risks:  (Unable to assess (UTA)) Triggers for Past Attempts: Unknown (Unable to assess  (UTA)) Intentional Self Injurious Behavior: None (Unable to assess (UTA)) Risk to Others: Homicidal Ideation: No (Unable to assess (UTA)) Thoughts of Harm to Others: No (Unable to assess (UTA)) Current Homicidal Intent: No (Unable to assess (UTA)) Current Homicidal Plan: No (Unable to assess (UTA)) Access to Homicidal Means: No (Unable to assess (UTA)) History of harm to others?: No (Unable to assess (UTA)) Assessment of Violence: None Noted Violent Behavior Description:  (Unable to assess (UTA)) Does patient have access to weapons?: No (Unable to assess (UTA)) Criminal Charges Pending?: No (Unable to assess (UTA)) Does patient have a court date: No (Unable to assess (UTA)) Prior Inpatient Therapy: Prior Inpatient Therapy: Yes (Unable to assess (UTA)) Prior Therapy Dates: 05/2016 (Unable to assess (UTA)) Prior Therapy Facilty/Provider(s): [facility] (Unable to assess (UTA)) Reason for Treatment: Behavioral Health Prior Outpatient Therapy: Prior Outpatient Therapy: Yes Prior Therapy Dates: Current Prior Therapy Facilty/Provider(s):  ([facility]) Reason for Treatment:  (Behavioral Health) Does patient have an ACCT team?: Yes Does patient have Intensive In-House Services?  : No Does patient have [facility] services? : No Does patient have P4CC services?: No  Past Medical History:  Past Medical History:  Diagnosis Date  . Hypertension   . Schizophrenia [facility])     Past Surgical History:  Procedure Laterality Date  . 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 No pertinent family history. Family Psychiatric  History: Unknown Social History:  History  Alcohol Use No     History  Drug Use No    Social History   Social History  . Marital status: Single    Spouse name: N/A  . Number of children: N/A  . Years of education: N/A   Social History Main Topics  . Smoking status: Current Every Day Smoker    Packs/day: 1.00    Types:  Cigarettes  . Smokeless tobacco: Current User  . Alcohol use No  . Drug use: No  . Sexual activity: Not Asked   Other Topics Concern  . None   Social History Narrative  . None   Additional Social History:    Allergies:  No Known Allergies  Labs:  No results found for this or any previous visit (from the past 48 hour(s)).  Current Facility-Administered Medications  Medication Dose Route Frequency Provider Last Rate Last Dose  . cloZAPine (CLOZARIL) tablet 100 mg  100 mg Oral Daily Merrily Brittle, MD   100 mg at 08/30/16 1415   And  . cloZAPine (CLOZARIL) tablet 400 mg  400 mg Oral QHS Merrily Brittle, MD   400 mg at 08/29/16 2236  . desmopressin (DDAVP) tablet 0.2 mg  0.2 mg Oral QHS Merrily Brittle, MD   0.2 mg at 08/29/16 2235  . divalproex (DEPAKOTE) DR tablet 1,000 mg  1,000 mg Oral Q12H Merrily Brittle, MD   1,000 mg at 08/30/16 1415  . docusate sodium (COLACE) capsule 200 mg  200 mg Oral BID Merrily Brittle, MD   200 mg at 08/29/16 2238  . FLUoxetine (PROZAC) capsule 10 mg  10 mg Oral Daily Merrily Brittle, MD   10 mg at 08/30/16 1415  . ipratropium (ATROVENT) 0.03 % nasal spray 2 spray  2 spray Nasal QHS Merrily Brittle, MD   2 spray at 08/29/16 2238  .  LORazepam (ATIVAN) tablet 1 mg  1 mg Oral Q6H PRN Schaevitz, Myra Rudeavid Matthew, MD   1 mg at 08/29/16 1510  . metFORMIN (GLUCOPHAGE) tablet 500 mg  500 mg Oral BID WC Merrily Brittleifenbark, Neil, MD   500 mg at 08/29/16 1651  . metoprolol tartrate (LOPRESSOR) tablet 12.5 mg  12.5 mg Oral BID Merrily Brittleifenbark, Neil, MD   12.5 mg at 08/29/16 2236  . mometasone-formoterol (DULERA) 200-5 MCG/ACT inhaler 2 puff  2 puff Inhalation BID Merrily Brittleifenbark, Neil, MD   2 puff at 08/29/16 2235  . polyethylene glycol (MIRALAX / GLYCOLAX) packet 17 g  17 g Oral Daily Merrily Brittleifenbark, Neil, MD   17 g at 08/27/16 1100  . senna (SENOKOT) tablet 17.2 mg  2 tablet Oral QHS Merrily Brittleifenbark, Neil, MD   17.2 mg at 08/29/16 2235  . simvastatin (ZOCOR) tablet 40 mg  40 mg Oral QHS  Merrily Brittleifenbark, Neil, MD   40 mg at 08/29/16 2235  . temazepam (RESTORIL) capsule 15 mg  15 mg Oral QHS Merrily Brittleifenbark, Neil, MD   15 mg at 08/29/16 2236   Current Outpatient Prescriptions  Medication Sig Dispense Refill  . budesonide-formoterol (SYMBICORT) 160-4.5 MCG/ACT inhaler Inhale 2 puffs into the lungs 2 (two) times daily.    . cloZAPine (CLOZARIL) 100 MG tablet Take 1-4 tablets (100-400 mg total) by mouth 2 (two) times daily. Patient takes 1 tablet (100 mg) in the morning and 4 tablets (400 mg) at bedtime. 150 tablet 1  . divalproex (DEPAKOTE) 500 MG DR tablet Take 2 tablets (1,000 mg total) by mouth every 12 (twelve) hours. 120 tablet 1  . docusate sodium (COLACE) 100 MG capsule Take 2 capsules (200 mg total) by mouth 2 (two) times daily. 60 capsule 1  . FLUoxetine (PROZAC) 10 MG capsule Take 1 capsule (10 mg total) by mouth daily. 30 capsule 1  . metFORMIN (GLUCOPHAGE) 500 MG tablet Take 1 tablet (500 mg total) by mouth 2 (two) times daily with a meal. 60 tablet 1  . metoprolol tartrate (LOPRESSOR) 25 MG tablet Take 0.5 tablets (12.5 mg total) by mouth 2 (two) times daily. 15 tablet 1  . polyethylene glycol (MIRALAX / GLYCOLAX) packet Take 17 g by mouth daily. 30 packet 1  . temazepam (RESTORIL) 15 MG capsule Take 1 capsule (15 mg total) by mouth at bedtime. 30 capsule 1  . cloZAPine (CLOZARIL) 100 MG tablet Take 1 tablet (100 mg total) by mouth daily. 30 tablet 1  . cloZAPine (CLOZARIL) 200 MG tablet Take 2 tablets (400 mg total) by mouth at bedtime. 60 tablet 1  . desmopressin (DDAVP) 0.2 MG tablet Take 1 tablet (0.2 mg total) by mouth at bedtime. 30 tablet 1  . desmopressin (DDAVP) 0.2 MG tablet Take 1 tablet (0.2 mg total) by mouth at bedtime. 30 tablet 1  . divalproex (DEPAKOTE) 500 MG DR tablet Take 2 tablets (1,000 mg total) by mouth every 12 (twelve) hours. 120 tablet 1  . docusate sodium (COLACE) 100 MG capsule Take 2 capsules (200 mg total) by mouth 2 (two) times daily. 120 capsule 1    . FLUoxetine (PROZAC) 10 MG capsule Take 1 capsule (10 mg total) by mouth daily. 30 capsule 1  . ipratropium (ATROVENT) 0.03 % nasal spray Place 2 sprays into the nose at bedtime. 30 mL 12  . ipratropium (ATROVENT) 0.03 % nasal spray Place 2 sprays into the nose at bedtime. 30 mL 1  . LORazepam (ATIVAN) 1 MG tablet Take 1 tablet (1 mg total) by mouth  every 6 (six) hours as needed for anxiety. 60 tablet 1  . metFORMIN (GLUCOPHAGE) 500 MG tablet Take 1 tablet (500 mg total) by mouth 2 (two) times daily with a meal. 60 tablet 1  . metoprolol tartrate (LOPRESSOR) 25 MG tablet Take 0.5 tablets (12.5 mg total) by mouth 2 (two) times daily. 30 tablet 1  . mometasone-formoterol (DULERA) 200-5 MCG/ACT AERO Inhale 2 puffs into the lungs 2 (two) times daily. 1 Inhaler 1  . [START ON 08/31/2016] polyethylene glycol (MIRALAX / GLYCOLAX) packet Take 17 g by mouth daily. 30 each 1  . senna (SENOKOT) 8.6 MG TABS tablet Take 2 tablets (17.2 mg total) by mouth at bedtime. 60 each 1  . senna (SENOKOT) 8.6 MG TABS tablet Take 2 tablets (17.2 mg total) by mouth at bedtime. 60 each 1  . simvastatin (ZOCOR) 40 MG tablet Take 1 tablet (40 mg total) by mouth at bedtime. 30 tablet 1  . simvastatin (ZOCOR) 40 MG tablet Take 1 tablet (40 mg total) by mouth at bedtime. 30 tablet 1  . temazepam (RESTORIL) 15 MG capsule Take 1 capsule (15 mg total) by mouth at bedtime. 30 capsule 1    Musculoskeletal: Strength & Muscle Tone: within normal limits Gait & Station: normal Patient leans: N/A  Psychiatric Specialty Exam: Physical Exam  Nursing note and vitals reviewed. Constitutional: He appears well-developed and well-nourished.  HENT:  Head: Normocephalic and atraumatic.  Eyes: Pupils are equal, round, and reactive to light. Conjunctivae are normal.  Neck: Normal range of motion.  Cardiovascular: Normal heart sounds.   Respiratory: Effort normal.  GI: Soft.  Musculoskeletal: Normal range of motion.        Arms: Neurological: He is alert.  Skin: Skin is warm and dry.  Psychiatric: His mood appears anxious. His affect is blunt. His speech is delayed and tangential. He is withdrawn. Thought content is paranoid. Cognition and memory are impaired. He expresses impulsivity and inappropriate judgment. He expresses no homicidal and no suicidal ideation. He exhibits abnormal recent memory.    Review of Systems  Constitutional: Negative.   HENT: Negative.   Eyes: Negative.   Respiratory: Negative.   Cardiovascular: Negative.   Gastrointestinal: Negative.   Musculoskeletal: Negative.   Skin: Negative.   Neurological: Negative.   Psychiatric/Behavioral: Negative for depression, hallucinations, memory loss, substance abuse and suicidal ideas. The patient is nervous/anxious. The patient does not have insomnia.     Blood pressure 105/81, pulse 83, temperature 97.8 F (36.6 C), temperature source Oral, resp. rate 16, height 5\' 7"  (1.702 m), weight 72.6 kg (160 lb), SpO2 99 %.Body mass index is 25.06 kg/m.  General Appearance: Disheveled  Eye Contact:  Fair  Speech:  Slow  Volume:  Decreased  Mood:  Euthymic  Affect:  Constricted  Thought Process:  Disorganized  Orientation:  Full (Time, Place, and Person)  Thought Content:  Illogical, Rumination and Tangential  Suicidal Thoughts:  No  Homicidal Thoughts:  No  Memory:  Immediate;   Good Recent;   Fair Remote;   Fair  Judgement:  Impaired  Insight:  Lacking  Psychomotor Activity:  Decreased  Concentration:  Concentration: Poor  Recall:  [employer] of Knowledge:  Fair  Language:  Fair  Akathisia:  No  Handed:  Right  AIMS (if indicated):     Assets:  Desire for Improvement Housing Resilience  ADL's:  Intact  Cognition:  Impaired,  Mild  Sleep:        Treatment Plan Summary: Medication  management and Plan Patient has been accepted to a group home and is agreeable to the plan. He has been calm and appropriate today. No sign of acute  dangerousness. We can discontinue commitment papers and get him released from the hospital to follow-up with appropriate outpatient treatment.  Disposition: No evidence of imminent risk to self or others at present.   Supportive therapy provided about ongoing stressors. Discussed crisis plan, support from social network, calling 911, coming to the Emergency Department, and calling Suicide Hotline.  Mordecai Rasmussen, MD 08/30/2016 4:38 PM

## 2016-08-30 NOTE — ED Provider Notes (Signed)
-----------------------------------------   4:29 PM on 08/30/2016 -----------------------------------------   Blood pressure 105/81, pulse 83, temperature 97.8 F (36.6 C), temperature source Oral, resp. rate 16, height 5\' 7"  (1.702 m), weight 72.6 kg (160 lb), SpO2 99 %.  The patient had no acute events since last update.  Calm and cooperative at this time.  Patient to be dispositioned group home.   Myrna BlazerSchaevitz, David Matthew, MD 08/30/16 [phone]

## 2016-08-30 NOTE — Progress Notes (Signed)
LCSW faxed over Fl2 to Lucienne CapersGrace Poole and chest x rays to be ordered  And spoke to [place] to arrange transportation to group Home.  Awaiting call back   Esmond Hinch LCSW

## 2016-08-30 NOTE — ED Provider Notes (Signed)
-----------------------------------------   6:43 AM on 08/30/2016 -----------------------------------------   Blood pressure 105/81, pulse 83, temperature 97.8 F (36.6 C), temperature source Oral, resp. rate 16, height 5\' 7"  (1.702 m), weight 72.6 kg (160 lb), SpO2 99 %.  The patient had no acute events since last update.  Resting at this time.  Disposition is pending Psychiatry/Behavioral Medicine team recommendations.     Irean HongSung, Navil Kole J, MD 08/30/16 [phone]

## 2016-08-30 NOTE — Progress Notes (Signed)
LCSW called Lucienne CapersGrace Poole and she will pick up patient at 4-4:30pm pt to discharge  His chest was ex-rayed and pt is TB free. Dr Shary Keylapac's completed scripts and all documentation.  Delta Air LinesClaudine Markice Torbert LCSW [phone]

## 2016-08-30 NOTE — ED Notes (Signed)
Patient refused breakfast and lunch today

## 2016-08-30 NOTE — Progress Notes (Signed)
[name] from [facility] called and is Engineer, building servicescoverage worker from [employer]- She was going to find out if Jeffrey [name] has accepted patient and report back to me.  Another group home provider [name] [phone] and she will plan to come out this afternoon.   Delta Air LinesClaudine Natara Monfort LCSW [phone]

## 2016-08-30 NOTE — ED Notes (Signed)
Pt is alert and oriented this evening and pleasant and cooperative with staff. Pt denies SI/HI and AVH at this time. Nutrition provided and pt is taking medications as prescribed. 15 minute checks are ongoing for safety.

## 2016-09-20 ENCOUNTER — Other Ambulatory Visit: Payer: Self-pay | Admitting: Psychiatry

## 2016-09-30 ENCOUNTER — Emergency Department
Admission: EM | Admit: 2016-09-30 | Discharge: 2016-10-25 | Disposition: A | Discharge status: Home / Self Care | Payer: Medicare Other | Attending: Emergency Medicine | Admitting: Emergency Medicine

## 2016-09-30 DIAGNOSIS — R4689 Other symptoms and signs involving appearance and behavior: Secondary | ICD-10-CM | POA: Diagnosis present

## 2016-09-30 DIAGNOSIS — F17210 Nicotine dependence, cigarettes, uncomplicated: Secondary | ICD-10-CM | POA: Insufficient documentation

## 2016-09-30 DIAGNOSIS — F203 Undifferentiated schizophrenia: Secondary | ICD-10-CM | POA: Diagnosis not present

## 2016-09-30 DIAGNOSIS — Z79899 Other long term (current) drug therapy: Secondary | ICD-10-CM | POA: Diagnosis not present

## 2016-09-30 DIAGNOSIS — I10 Essential (primary) hypertension: Secondary | ICD-10-CM | POA: Insufficient documentation

## 2016-09-30 DIAGNOSIS — Z7984 Long term (current) use of oral hypoglycemic drugs: Secondary | ICD-10-CM | POA: Insufficient documentation

## 2016-09-30 DIAGNOSIS — F69 Unspecified disorder of adult personality and behavior: Secondary | ICD-10-CM | POA: Diagnosis not present

## 2016-09-30 LAB — CBC
HCT: 40.9 % (ref 40.0–52.0)
HEMOGLOBIN: 14.2 g/dL (ref 13.0–18.0)
MCH: 31.3 pg (ref 26.0–34.0)
MCHC: 34.7 g/dL (ref 32.0–36.0)
MCV: 90.4 fL (ref 80.0–100.0)
Platelets: 208 10*3/uL (ref 150–440)
RBC: 4.52 MIL/uL (ref 4.40–5.90)
RDW: 14.1 % (ref 11.5–14.5)
WBC: 11.1 10*3/uL — ABNORMAL HIGH (ref 3.8–10.6)

## 2016-09-30 LAB — URINE DRUG SCREEN, QUALITATIVE (ARMC ONLY)
Amphetamines, Ur Screen: NOT DETECTED
Barbiturates, Ur Screen: NOT DETECTED
Benzodiazepine, Ur Scrn: POSITIVE — AB
COCAINE METABOLITE, UR ~~LOC~~: NOT DETECTED
Cannabinoid 50 Ng, Ur ~~LOC~~: NOT DETECTED
MDMA (ECSTASY) UR SCREEN: NOT DETECTED
METHADONE SCREEN, URINE: NOT DETECTED
Opiate, Ur Screen: NOT DETECTED
Phencyclidine (PCP) Ur S: NOT DETECTED
TRICYCLIC, UR SCREEN: NOT DETECTED

## 2016-09-30 LAB — COMPREHENSIVE METABOLIC PANEL
ALK PHOS: 44 U/L (ref 38–126)
ALT: 14 U/L — ABNORMAL LOW (ref 17–63)
ANION GAP: 7 (ref 5–15)
AST: 22 U/L (ref 15–41)
Albumin: 4.1 g/dL (ref 3.5–5.0)
BUN: 28 mg/dL — ABNORMAL HIGH (ref 6–20)
CALCIUM: 9.6 mg/dL (ref 8.9–10.3)
CHLORIDE: 107 mmol/L (ref 101–111)
CO2: 28 mmol/L (ref 22–32)
Creatinine, Ser: 0.95 mg/dL (ref 0.61–1.24)
Glucose, Bld: 97 mg/dL (ref 65–99)
Potassium: 5 mmol/L (ref 3.5–5.1)
SODIUM: 142 mmol/L (ref 135–145)
Total Bilirubin: 0.6 mg/dL (ref 0.3–1.2)
Total Protein: 7 g/dL (ref 6.5–8.1)

## 2016-09-30 LAB — ACETAMINOPHEN LEVEL

## 2016-09-30 LAB — ETHANOL

## 2016-09-30 LAB — SALICYLATE LEVEL

## 2016-09-30 NOTE — ED Notes (Signed)
Spoke to Dole FoodJim Billings from [facility] [phone] and they are refusing to take him back at the group home. They will have him placed In a different facility in the AM and they will contact us with further placement information.
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
## 2016-09-30 NOTE — ED Notes (Signed)
Hourly rounding reveals patient in room. No complaints, stable, in no acute distress. Q15 minute rounds and monitoring via [employer] to continue. 

## 2016-09-30 NOTE — Discharge Instructions (Signed)
Return to the ER for any new or worsening symptoms that concern you. °
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
## 2016-09-30 NOTE — ED Notes (Signed)
Alert patient, clear speech, steady gait, states he does not know why he is here. Arrives IVC.
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
## 2016-10-01 DIAGNOSIS — F203 Undifferentiated schizophrenia: Secondary | ICD-10-CM | POA: Diagnosis not present

## 2016-10-01 LAB — DIFFERENTIAL
BASOS PCT: 1 %
Basophils Absolute: 0.1 10*3/uL (ref 0–0.1)
EOS ABS: 0 10*3/uL (ref 0–0.7)
EOS PCT: 0 %
Lymphocytes Relative: 20 %
Lymphs Abs: 2.2 10*3/uL (ref 1.0–3.6)
MONO ABS: 1.1 10*3/uL — AB (ref 0.2–1.0)
Monocytes Relative: 10 %
NEUTROS PCT: 69 %
Neutro Abs: 7.5 10*3/uL — ABNORMAL HIGH (ref 1.4–6.5)

## 2016-10-01 MED ORDER — SIMVASTATIN 40 MG PO TABS
40.0000 mg | ORAL_TABLET | Freq: Every day | ORAL | Status: DC
  Administered 2016-10-02 – 2016-10-24 (×22): 40 mg via ORAL
  Filled 2016-10-01 (×22): qty 1

## 2016-10-01 MED ORDER — MOMETASONE FURO-FORMOTEROL FUM 200-5 MCG/ACT IN AERO
2.0000 | INHALATION_SPRAY | Freq: Two times a day (BID) | RESPIRATORY_TRACT | Status: DC
  Administered 2016-10-01 – 2016-10-25 (×42): 2 via RESPIRATORY_TRACT
  Filled 2016-10-01 (×2): qty 8.8

## 2016-10-01 MED ORDER — DIVALPROEX SODIUM 500 MG PO DR TAB
1000.0000 mg | DELAYED_RELEASE_TABLET | Freq: Two times a day (BID) | ORAL | Status: DC
  Administered 2016-10-01 – 2016-10-25 (×49): 1000 mg via ORAL
  Filled 2016-10-01 (×52): qty 2

## 2016-10-01 MED ORDER — METFORMIN HCL 500 MG PO TABS
500.0000 mg | ORAL_TABLET | Freq: Two times a day (BID) | ORAL | Status: DC
  Administered 2016-10-01 – 2016-10-20 (×36): 500 mg via ORAL
  Filled 2016-10-01 (×37): qty 1

## 2016-10-01 MED ORDER — CLOZAPINE 100 MG PO TABS
100.0000 mg | ORAL_TABLET | Freq: Every day | ORAL | Status: DC
  Administered 2016-10-02 – 2016-10-25 (×24): 100 mg via ORAL
  Filled 2016-10-01: qty 4
  Filled 2016-10-01 (×23): qty 1

## 2016-10-01 MED ORDER — CLOZAPINE 100 MG PO TABS
400.0000 mg | ORAL_TABLET | Freq: Every day | ORAL | Status: DC
  Administered 2016-10-01 – 2016-10-24 (×24): 400 mg via ORAL
  Filled 2016-10-01 (×24): qty 4

## 2016-10-01 MED ORDER — SENNA 8.6 MG PO TABS
2.0000 | ORAL_TABLET | Freq: Every day | ORAL | Status: DC
  Administered 2016-10-01 – 2016-10-24 (×19): 17.2 mg via ORAL
  Filled 2016-10-01 (×26): qty 2

## 2016-10-01 MED ORDER — TEMAZEPAM 7.5 MG PO CAPS
15.0000 mg | ORAL_CAPSULE | Freq: Every day | ORAL | Status: DC
  Administered 2016-10-01: 15 mg via ORAL
  Filled 2016-10-01: qty 2

## 2016-10-01 MED ORDER — DOCUSATE SODIUM 100 MG PO CAPS
200.0000 mg | ORAL_CAPSULE | Freq: Two times a day (BID) | ORAL | Status: DC
  Administered 2016-10-01 – 2016-10-25 (×37): 200 mg via ORAL
  Filled 2016-10-01 (×56): qty 2

## 2016-10-01 MED ORDER — FLUOXETINE HCL 10 MG PO CAPS
10.0000 mg | ORAL_CAPSULE | Freq: Every day | ORAL | Status: DC
  Administered 2016-10-01 – 2016-10-25 (×25): 10 mg via ORAL
  Filled 2016-10-01 (×25): qty 1

## 2016-10-01 MED ORDER — CHLORPROMAZINE HCL 25 MG PO TABS
100.0000 mg | ORAL_TABLET | Freq: Four times a day (QID) | ORAL | Status: DC | PRN
  Administered 2016-10-01 – 2016-10-20 (×4): 100 mg via ORAL
  Filled 2016-10-01 (×4): qty 4

## 2016-10-01 MED ORDER — DESMOPRESSIN ACETATE 0.2 MG PO TABS
0.2000 mg | ORAL_TABLET | Freq: Every day | ORAL | Status: DC
  Administered 2016-10-01 – 2016-10-24 (×24): 0.2 mg via ORAL
  Filled 2016-10-01 (×24): qty 1

## 2016-10-01 MED ORDER — METOPROLOL TARTRATE 25 MG PO TABS
12.5000 mg | ORAL_TABLET | Freq: Two times a day (BID) | ORAL | Status: DC
  Administered 2016-10-01 – 2016-10-20 (×37): 12.5 mg via ORAL
  Filled 2016-10-01 (×40): qty 1

## 2016-10-01 NOTE — ED Notes (Signed)
Hourly rounding reveals patient sleeping in room. No complaints, stable, in no acute distress. Q15 minute rounds and monitoring via Security Cameras to continue. 

## 2016-10-01 NOTE — ED Notes (Signed)
Behavior Activity: Pacing watching TV, resting in bed Attitude: Cooperative Mood/Affect: anxious/sad Thought Process: WNL Thought Content: tangential Perceptual Disturbance: N/A Orientation: A/OX4 Hygiene: Minimal Meals:  Visitors: N/A PRN Medication: N/A

## 2016-10-01 NOTE — ED Notes (Signed)
Patient ate 100% of lunch and beverage.  

## 2016-10-01 NOTE — ED Notes (Signed)
Patient ate 100% of breakfast and beverage.  

## 2016-10-01 NOTE — ED Notes (Signed)
Patient is taking a shower, No signs of distress, no behavioral issues at this time.

## 2016-10-01 NOTE — ED Notes (Signed)
Behavior Activity: resting in bed watching TV Attitude: Cooperative Mood/Affect: anxious/sad Thought Process: WNL Thought Content:  Perceptual Disturbance: N/A Orientation: A/OX4 Hygiene: Minimal Meals: snack provided Visitors: N/A PRN Medication: N/A
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
## 2016-10-01 NOTE — ED Notes (Signed)
Patient watching tv, no behavioral issues at this time. q 15 minute checks and camera surveillance in progress for safety.

## 2016-10-01 NOTE — Progress Notes (Signed)
MEDICATION RELATED CONSULT NOTE - INITIAL   Pharmacy Consult for Clozapine Lab Monitoring and REMS reporting   No Known Allergies  Patient Measurements: Height: 5\' 9"  (175.3 cm) Weight: 195 lb (88.5 kg) IBW/kg (Calculated) : 70.7   Recent Labs  09/30/16 1856  WBC 11.1*  HGB 14.2  HCT 40.9  PLT 208  CREATININE 0.95  ALBUMIN 4.1  PROT 7.0  AST 22  ALT 14*  ALKPHOS 44  BILITOT 0.6   Medical History: Past Medical History:  Diagnosis Date  . Hypertension   . Schizophrenia [facility](HCC)     Assessment: 56 yo male with PMH of schizophrenia. Pharmacy consulted for Clozapine Lab monitoring and REMS Reporting.  Patients reported home dose is 1mg  QAM and 400 QHS.   ANC on admission if 7500.   Plan:  ANC of 7500 reported to Clozapine REMs website on 9/11. Patient listed as eligible to receive clozapine with every 4 week monitoring. Per policy will order CBC w/Diff weekly.   Gardner CandleSheema M Wateen Varon, PharmD, BCPS Clinical Pharmacist 10/01/2016 4:34 PM
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
## 2016-10-01 NOTE — ED Notes (Signed)
PT  VOL/  PENDING  PLACEMENT 
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
## 2016-10-02 DIAGNOSIS — F203 Undifferentiated schizophrenia: Secondary | ICD-10-CM | POA: Diagnosis not present

## 2016-10-02 MED ORDER — TEMAZEPAM 7.5 MG PO CAPS
7.5000 mg | ORAL_CAPSULE | Freq: Every day | ORAL | Status: DC
  Administered 2016-10-02 – 2016-10-24 (×23): 7.5 mg via ORAL
  Filled 2016-10-02 (×23): qty 1

## 2016-10-02 NOTE — ED Notes (Signed)
Behavior Activity: Lying in bed with eyes closed. Appears to be asleep Attitude: Cooperative Mood/Affect: UTA Thought Process: UTA Thought Content: UTA Perceptual Disturbance: UTA Orientation: UTA Hygiene: N/A Meals: N/A Visitors: N/A PRN Medication: N/A  
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
## 2016-10-02 NOTE — ED Provider Notes (Signed)
-----------------------------------------   12:02 AM on 10/02/2016 -----------------------------------------   Blood pressure 116/70, pulse 71, temperature 98 F (36.7 C), temperature source Oral, resp. rate 16, height 5\' 9"  (1.753 m), weight 88.5 kg (195 lb), SpO2 97 %.  [facility] that the patient had a dizzy spell. It was shortly after taking his nighttime dose of temazepam. He is hemodynamically stable and is acting appropriately.    Merrily Brittleifenbark, Royanne Warshaw, MD 10/02/16 0002
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
## 2016-10-02 NOTE — ED Notes (Signed)
Patient brought dinner 

## 2016-10-02 NOTE — ED Notes (Signed)
Behavior Activity: Pacing, stretching Attitude: Cooperative Mood/Affect: Appropriate/ pleasant Thought Process: WNL Thought Content: tangential Perceptual Disturbance: N/A Orientation: A/OX4 Hygiene: Minimal Meals: Snack provided Visitors: N/A PRN Medication: N/A

## 2016-10-02 NOTE — ED Notes (Signed)
Patient had BM in shower- when asked if it was an accident, pt replied nervously," just get a squueeggee"

## 2016-10-02 NOTE — ED Notes (Signed)
Patient has been calm and cooperative throughout shift without any behavioral issues.

## 2016-10-02 NOTE — ED Notes (Signed)
Lunch brought to patient 

## 2016-10-02 NOTE — ED Notes (Signed)
Patient given new scrubs, towels, and toiletries and is taking a shower

## 2016-10-02 NOTE — ED Notes (Signed)
Behavior Activity: Pacing, stretching Attitude: Cooperative Mood/Affect: Appropriate/ pleasant Thought Process: WNL Thought Content: tangential Perceptual Disturbance: N/A Orientation: A/OX4 Hygiene: Minimal Meals: Visitors: N/A PRN Medication: N/A

## 2016-10-03 NOTE — ED Notes (Signed)
Behavior Activity: Lying in bed with eyes closed. Appears to be asleep Attitude: Cooperative Mood/Affect: UTA Thought Process: UTA Thought Content: UTA Perceptual Disturbance: UTA Orientation: UTA Hygiene: N/A Meals: N/A Visitors: N/A PRN Medication: N/A  

## 2016-10-03 NOTE — ED Notes (Signed)
Patient is sleeping, no signs of distress, q 15 minute checks and camera surveillance in progress for safety.

## 2016-10-03 NOTE — ED Notes (Signed)

## 2016-10-03 NOTE — Clinical Social Work Note (Signed)
CSW received phone call from Ron at [employer] [facility].  He was asking for FL2 to be faxed to him.  CSW sent him requested clinical information, guardian trying to work on placement for patient.  Ervin KnackEric R. Antonae Zbikowski, MSW, Theresia MajorsLCSWA [phone]  10/03/2016 2:38 PM

## 2016-10-03 NOTE — ED Notes (Signed)
Patient ate 100% of breakfast and lunch and beverage.

## 2016-10-03 NOTE — ED Notes (Signed)
Pt. Alert and oriented, warm and dry, in no distress. Pt. Denies SI, HI, and AVH. Waiting on placement.  Pt. Encouraged to let nursing staff know of any concerns or needs.
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
## 2016-10-03 NOTE — ED Notes (Signed)
Patient is watching, tv, no signs of distress, cooperative at this time, q 15 minute checks and camera surveillance in progress for safety.

## 2016-10-03 NOTE — ED Provider Notes (Signed)
-----------------------------------------   7:18 AM on 10/03/2016 -----------------------------------------   Blood pressure 133/87, pulse 73, temperature (!) 97.4 F (36.3 C), temperature source Oral, resp. rate 16, height 5\' 9"  (1.753 m), weight 88.5 kg (195 lb), SpO2 100 %.  The patient had no acute events since last update.  Calm and cooperative at this time.  Disposition is pending Psychiatry/Behavioral Medicine team recommendations.     Irean HongSung, Thailan Sava J, MD 10/03/16 [phone]

## 2016-10-03 NOTE — ED Notes (Signed)
Patient took all medications, patient is calm and cooperative, no behavioral issues noted, Nurse will continue to monitor for safety.

## 2016-10-03 NOTE — ED Notes (Signed)
PT VOLUNTARY PENDING PLACEMENT. 

## 2016-10-04 DIAGNOSIS — F203 Undifferentiated schizophrenia: Secondary | ICD-10-CM | POA: Diagnosis not present

## 2016-10-04 NOTE — ED Notes (Signed)
Patient has been calm and cooperative throughout shift with no behavioral issues
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
## 2016-10-04 NOTE — ED Notes (Signed)
Lunch brought to client

## 2016-10-04 NOTE — ED Provider Notes (Signed)
-----------------------------------------   7:50 AM on 10/04/2016 -----------------------------------------   Blood pressure 121/77, pulse 70, temperature (!) 97.4 F (36.3 C), temperature source Oral, resp. rate 16, height  (1.753 m), weight 88.5 kg (195 lb), SpO2 100 %.  The patient had no acute events since last update.  Calm and cooperative at this time.  Disposition is pending Psychiatry/Behavioral Medicine team recommendations.     Myrna Blazer, MD 10/04/16 [phone]

## 2016-10-04 NOTE — ED Notes (Signed)
Snack and drink given to patient. 
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
## 2016-10-04 NOTE — ED Notes (Signed)
Sandwich tray and drink given to pt.   

## 2016-10-04 NOTE — Progress Notes (Signed)
LCSW called patients Jeffrey [name] and left voice mail message awaiting call back. No possible beds at this time  [name] LCSW

## 2016-10-04 NOTE — ED Notes (Signed)
Breakfast brought to patient. Pt is calm, cooperative and compliant with meds. Pt remains safe with 15 minute checks

## 2016-10-04 NOTE — ED Notes (Signed)
Dinner brought to patient 

## 2016-10-04 NOTE — Progress Notes (Signed)
LCSW received a call back from patient guardian Corwin Levins [phone] he requested an fl2 to be faxed over to [facility] so it could be sent to a facility. He sent out patient information to 2 facilities and patient was turned down.  Fax sent, advised guardian I would be on this weekend and he could call me with progress on facilities/group home beds.  Charron Coultas LCSW

## 2016-10-04 NOTE — ED Notes (Signed)
Pt. Alert and oriented, warm and dry, in no distress. Pt. Denies SI, HI, and AVH. Pt. Encouraged to let nursing staff know of any concerns or needs. 

## 2016-10-04 NOTE — Progress Notes (Signed)
LCSW received a call from [place] who was checking in on patient. She reported shei is with the act team. She reports guardian is still looking for suitable placement.  Maicey Barrientez LCSW

## 2016-10-04 NOTE — ED Notes (Signed)
PT  VOL/  PENDING  PLACEMENT 

## 2016-10-04 NOTE — ED Notes (Signed)

## 2016-10-05 NOTE — ED Provider Notes (Signed)
-----------------------------------------   4:44 AM on 10/05/2016 -----------------------------------------   Blood pressure 138/80, pulse 94, temperature 98.4 F (36.9 C), temperature source Oral, resp. rate 16, height 1.753 m ( ), weight 88.5 kg (195 lb), SpO2 100 %.  The patient had no acute events since last update.  Calm and cooperative at this time.  Pending placement.    Loleta Rose, MD 10/05/16 [phone]

## 2016-10-05 NOTE — ED Notes (Signed)
Pt. Alert and oriented, warm and dry, in no distress. Pt. Denies SI, HI, and AVH. Pt. Encouraged to let nursing staff know of any concerns or needs. 

## 2016-10-05 NOTE — ED Notes (Signed)

## 2016-10-05 NOTE — ED Notes (Signed)
Pt pacing, appears restless. Pt told RN and security he was doing okay, just wanting dinner. Pt remains cooperative at this time. Maintained on 15 minute checks and observation by security camera for safety.

## 2016-10-05 NOTE — ED Notes (Signed)
Pt eating lunch. Remains calm and cooperative. Maintained on 15 minute checks and observation by security camera for safety.

## 2016-10-05 NOTE — ED Notes (Signed)
Patient resting quietly in room. No noted distress or abnormal behaviors noted. Will continue 15 minute checks and observation by security camera for safety. 

## 2016-10-05 NOTE — ED Notes (Signed)
Pt can be seen gesturing, pacing. Pt compliant with VS and medications. Pt denies any agitation or anxiety. Maintained on 15 minute checks and observation by security camera for safety.

## 2016-10-06 NOTE — ED Notes (Signed)
Patient resting quietly in room. No noted distress or abnormal behaviors noted. Will continue 15 minute checks and observation by security camera for safety. 

## 2016-10-06 NOTE — ED Notes (Signed)
PT  VOL/  PENDING  PLACEMENT 

## 2016-10-06 NOTE — ED Notes (Signed)
No incontinent episode in the shower.

## 2016-10-06 NOTE — ED Notes (Signed)
Pt given breakfast tray

## 2016-10-06 NOTE — ED Notes (Signed)
Pt received dinner tray and is watching football. Pt calm and cooperative. Maintained on 15 minute checks and observation by security camera for safety.

## 2016-10-06 NOTE — ED Notes (Signed)
Pt taking a shower. All bed linen has been changed.

## 2016-10-06 NOTE — ED Notes (Signed)
Pt. Alert and oriented, warm and dry, in no distress. Pt. Denies SI, HI, and AVH. Pt. Encouraged to let nursing staff know of any concerns or needs. 

## 2016-10-06 NOTE — ED Notes (Signed)

## 2016-10-06 NOTE — ED Notes (Signed)
Pt given lunch tray.

## 2016-10-06 NOTE — ED Provider Notes (Signed)
-----------------------------------------   5:51 AM on 10/06/2016 -----------------------------------------   Blood pressure 124/87, pulse 90, temperature 98.9 F (37.2 C), temperature source Oral, resp. rate 16, height  (1.753 m), weight 88.5 kg (195 lb), SpO2 100 %.  The patient had no acute events since last update.  Calm and cooperative at this time.  Attempting to place into new living facility.    Minna Antis, MD 10/06/16 [phone]

## 2016-10-07 DIAGNOSIS — F203 Undifferentiated schizophrenia: Secondary | ICD-10-CM | POA: Diagnosis not present

## 2016-10-07 MED ORDER — CHLORPROMAZINE HCL 25 MG PO TABS: 50.0000 mg | ORAL_TABLET | Freq: Four times a day (QID) | ORAL | Status: DC | PRN
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
## 2016-10-07 NOTE — ED Notes (Signed)
Patient in dayroom pacing- talking to self as though responding to internal stimuli. Pt given drink. Pt has been calm and cooperative with no behavioral issues.

## 2016-10-07 NOTE — Clinical Social Work Note (Signed)
CSW received call this AM from pt Jeffrey [name] [phone], who stated pt has been turned down by 2 additional group homes. However, pt has 4 possible group home placements that are reviewing his information. Guardian will continue to keep CSW updated regarding pt placement. CSW continuing to follow for discharge needs.   Corlis Hove, Theresia Majors, [facility] Clinical Social Worker-ED [phone]

## 2016-10-07 NOTE — ED Notes (Signed)
Dinner brought to patient. Patient has been calm and cooperative, compliant with meds

## 2016-10-07 NOTE — ED Notes (Signed)
Patient easily aroused for breakfast and meds. Pt calm and cooperative and compliant with meds

## 2016-10-07 NOTE — ED Notes (Signed)
Pt resting quietly with eyes closed. Patient states, "don't bother me right now, I'm trying to sleep," after entering pt's room to obtain blood pressure for morning meds. Will attempt VS and meds in an hour.

## 2016-10-07 NOTE — ED Notes (Signed)
PT  VOL  PENDING  PLACEMENT  PT  SEEN  BY  DR CLAPACS 

## 2016-10-07 NOTE — ED Notes (Signed)
Lunch brought to patient 

## 2016-10-07 NOTE — ED Provider Notes (Signed)
-----------------------------------------   7:08 AM on 10/07/2016 -----------------------------------------   Blood pressure 120/90, pulse 99, temperature 98.4 F (36.9 C), temperature source Oral, resp. rate 16, height  (1.753 m), weight 88.5 kg (195 lb), SpO2 100 %.  The patient had no acute events since last update.  Calm and cooperative at this time.  The patient is awaiting placement by social work.     Rebecka Apley, MD 10/07/16 [phone]
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
## 2016-10-08 LAB — CBC WITH DIFFERENTIAL/PLATELET
Basophils Absolute: 0 10*3/uL (ref 0–0.1)
Basophils Relative: 0 %
Eosinophils Absolute: 0 10*3/uL (ref 0–0.7)
Eosinophils Relative: 0 %
HEMATOCRIT: 40.1 % (ref 40.0–52.0)
HEMOGLOBIN: 14.2 g/dL (ref 13.0–18.0)
LYMPHS ABS: 1.9 10*3/uL (ref 1.0–3.6)
LYMPHS PCT: 28 %
MCH: 31.7 pg (ref 26.0–34.0)
MCHC: 35.5 g/dL (ref 32.0–36.0)
MCV: 89.4 fL (ref 80.0–100.0)
Monocytes Absolute: 0.7 10*3/uL (ref 0.2–1.0)
Monocytes Relative: 11 %
NEUTROS PCT: 61 %
Neutro Abs: 4.2 10*3/uL (ref 1.4–6.5)
Platelets: 191 10*3/uL (ref 150–440)
RBC: 4.48 MIL/uL (ref 4.40–5.90)
RDW: 13.9 % (ref 11.5–14.5)
WBC: 7 10*3/uL (ref 3.8–10.6)

## 2016-10-08 NOTE — ED Notes (Signed)
Pt irritable, but compliant with VS and medications. Pt wanting to rest. Maintained on 15 minute checks and observation by security camera for safety.

## 2016-10-08 NOTE — ED Notes (Signed)
BEHAVIORAL HEALTH ROUNDING  Patient sleeping: No.  Patient alert and oriented: yes  Behavior appropriate: Yes. ; If no, describe:  Nutrition and fluids offered: Yes  Toileting and hygiene offered: Yes  Sitter present: not applicable, Q 15 min safety rounds and observation via security camera. Law enforcement present: Yes ODS  Pt sitting in dayroom watching TV.

## 2016-10-08 NOTE — Clinical Social Work Note (Addendum)
CSW received call from Kaleva at [facility]-[phone], stating she is assisting Jeffrey [name] with finding placement. Cheri Rous stated she would be visiting pt today and would need a copy of the FL-2. Per BHU RN, [place] visited pt, but left before CSW was able to give FL-2. CSW later spoke with [facility] and was able to fax FL-2 to [phone].   CSW received a call from Pia Mau, stating Guardian of Estate (GOE) attorney Ed Andrey Farmer is wanting to drop off pt clothes and get a copy of pt drivers license. BHU RN confirmed pt does not have a Scientist, product/process development with him. CSW called [phone] attempting to reach Mr. Andrey Farmer, but number is an unknown automated answering service. CSW spoke with pt Jeffrey [name] [phone], who stated he does not have pt's drivers license or a copy of it. [name] confirmed Mr. Andrey Farmer is pt's GOE. [name] stated pt has been turned down by [facility] and another group home, but has another 2 possibilities at [facility] in [place] and another facility in [place]. [name] stating he may have to start looking out of state and also considering the possibility of supportive independent living. [name] will continue to keep CSW updated regarding pt placement progress.   CSW staffed case with AD Zack. CSW continuing to follow for discharge needs.   Corlis Hove, Theresia Majors, [facility] Clinical Social Worker-ED [phone]

## 2016-10-08 NOTE — ED Notes (Addendum)
Pt pleasant and cooperative. Complaint with VS, medications and lab draw. Maintained on 15 minute checks and observation by security camera for safety.

## 2016-10-08 NOTE — ED Notes (Signed)
Pt given lunch tray.

## 2016-10-08 NOTE — ED Notes (Signed)
Pt asked to be able to walk around unit to "get some exercise."  Pt has been in dayroom for an hour, watching TV. No inapropriate behavior / aggression. Maintained on 15 minute checks and observation by security camera for safety.

## 2016-10-08 NOTE — ED Notes (Signed)
Pt refusing to allow blood to be drawn. "I just want to rest. Don't bother me."

## 2016-10-08 NOTE — ED Notes (Signed)
Pt remains calm and cooperative. Allowed CBC to be drawn. No aggression / inappropriate behavior.  Maintained on 15 minute checks and observation by security camera for safety.

## 2016-10-08 NOTE — ED Notes (Signed)
Estate attorney brought a large black bag for patient. Bag contains jackets on hanger, pants, socks and boots. Labeled with patient stickers and placed with patient's other belongings.

## 2016-10-08 NOTE — ED Provider Notes (Signed)
-----------------------------------------   3:36 PM on 10/08/2016 -----------------------------------------   Blood pressure 127/82, pulse 76, temperature 98.5 F (36.9 C), temperature source Oral, resp. rate 18, height  (1.753 m), weight 88.5 kg (195 lb), SpO2 98 %.  The patient had no acute events since last update.  Calm and cooperative at this time.  Disposition is pending Psychiatry/Behavioral Medicine team recommendations.     Sharman Cheek, MD 10/08/16 1536

## 2016-10-08 NOTE — ED Notes (Signed)
BEHAVIORAL HEALTH ROUNDING Patient sleeping: Yes.   Patient alert and oriented: not applicable SLEEPING Behavior appropriate: Yes.  ; If no, describe: SLEEPING Nutrition and fluids offered: No SLEEPING Toileting and hygiene offered: NoSLEEPING Sitter present: not applicable, Q 15 min safety rounds and observation via security camera. Law enforcement present: Yes ODS 

## 2016-10-08 NOTE — Progress Notes (Signed)
MEDICATION RELATED CONSULT NOTE - INITIAL   Pharmacy Consult for Clozapine Lab Monitoring and REMS reporting   No Known Allergies  Patient Measurements: Height:  (175.3 cm) Weight: 195 lb (88.5 kg) IBW/kg (Calculated) : 70.7   Recent Labs  10/08/16 1728  WBC 7.0  HGB 14.2  HCT 40.1  PLT 191   Medical History: Past Medical History:  Diagnosis Date  . Hypertension   . Schizophrenia [facility])     Assessment: 56 yo male with PMH of schizophrenia. Pharmacy consulted for Clozapine Lab monitoring and REMS Reporting.   Plan:  ANC of 4200 reported to clozapine REMS on 10/08/16.   Patient listed as eligible to receive clozapine with every 4 week monitoring. Per policy will order CBC w/Diff weekly while inpatient.   Cindi Carbon, PharmD, BCPS Clinical Pharmacist 10/08/2016 6:43 PM

## 2016-10-08 NOTE — ED Notes (Signed)
BEHAVIORAL HEALTH ROUNDING  Patient sleeping: No.  Patient alert and oriented: yes  Behavior appropriate: Yes. ; If no, describe:  Nutrition and fluids offered: Yes  Toileting and hygiene offered: Yes  Sitter present: not applicable, Q 15 min safety rounds and observation via security camera. Law enforcement present: Yes ODS  

## 2016-10-08 NOTE — ED Notes (Signed)
ENVIRONMENTAL ASSESSMENT  Potentially harmful objects out of patient reach: Yes.  Personal belongings secured: Yes.  Patient dressed in hospital provided attire only: Yes.  Plastic bags out of patient reach: Yes.  Patient care equipment (cords, cables, call bells, lines, and drains) shortened, removed, or accounted for: Yes.  Equipment and supplies removed from bottom of stretcher: Yes.  Potentially toxic materials out of patient reach: Yes.  Sharps container removed or out of patient reach: Yes.   BEHAVIORAL HEALTH ROUNDING  Patient sleeping: No.  Patient alert and oriented: yes  Behavior appropriate: Yes. ; If no, describe:  Nutrition and fluids offered: Yes  Toileting and hygiene offered: Yes  Sitter present: not applicable, Q 15 min safety rounds and observation via security camera. Law enforcement present: Yes ODS  ED BHU PLACEMENT JUSTIFICATION  Is the patient under IVC or is there intent for IVC: No.  Is the patient medically cleared: Yes.  Is there vacancy in the ED BHU: Yes.  Is the population mix appropriate for patient: Yes.  Is the patient awaiting placement in inpatient or outpatient setting: Yes.  Has the patient had a psychiatric consult: Yes.  Survey of unit performed for contraband, proper placement and condition of furniture, tampering with fixtures in bathroom, shower, and each patient room: Yes. ; Findings: All clear  APPEARANCE/BEHAVIOR  calm, cooperative and adequate rapport can be established  NEURO ASSESSMENT  Orientation: time, place and person  Hallucinations: No.None noted (Hallucinations)  Speech: Normal  Gait: normal  RESPIRATORY ASSESSMENT  WNL  CARDIOVASCULAR ASSESSMENT  WNL  GASTROINTESTINAL ASSESSMENT  WNL  EXTREMITIES  WNL (pt has amputation of left arm at mid forearm area) PLAN OF CARE  Provide calm/safe environment. Vital signs assessed TID. ED BHU Assessment once each 12-hour shift. Collaborate with TTS daily or as condition indicates.  Assure the ED provider has rounded once each shift. Provide and encourage hygiene. Provide redirection as needed. Assess for escalating behavior; address immediately and inform ED provider.  Assess family dynamic and appropriateness for visitation as needed: Yes. ; If necessary, describe findings:  Educate the patient/family about BHU procedures/visitation: Yes. ; If necessary, describe findings: Pt is calm and cooperative at this time. Pt understanding and accepting of unit procedures/rules. Will continue to monitor with Q 15 min safety rounds and observation via security camera.

## 2016-10-08 NOTE — ED Notes (Signed)
Pt given breakfast tray. Pt continues to rest comfortably. Maintained on 15 minute checks and observation by security camera for safety.

## 2016-10-09 NOTE — ED Notes (Signed)
Behavior Activity: Lying in bed with eyes closed. Appears to be asleep Attitude: Cooperative Mood/Affect: UTA Thought Process: UTA Thought Content: UTA Perceptual Disturbance: UTA Orientation: UTA Hygiene: N/A Meals: N/A Visitors: N/A PRN Medication: N/A  

## 2016-10-09 NOTE — ED Notes (Signed)
Behavior Activity: Pacing, watching TV Attitude: Cooperative Mood/Affect: Depressed/sad Thought Process: tangential Thought Content: UTA Perceptual Disturbance: N/A Orientation: A/OX4 Hygiene: Minimal Meals:  Visitors: N/A PRN Medication: N/A 

## 2016-10-09 NOTE — ED Notes (Signed)
BEHAVIORAL HEALTH ROUNDING Patient sleeping: Yes.   Patient alert and oriented: not applicable SLEEPING Behavior appropriate: Yes.  ; If no, describe: SLEEPING Nutrition and fluids offered: No SLEEPING Toileting and hygiene offered: NoSLEEPING Sitter present: not applicable, Q 15 min safety rounds and observation via security camera. Law enforcement present: Yes ODS 

## 2016-10-09 NOTE — ED Notes (Signed)
PT  VOL/  PENDING  PLACEMENT 

## 2016-10-09 NOTE — Clinical Social Work Note (Signed)
Guardian Jeffrey [name] met with pt today in [place]. Pt was calm and cooperative. [name] and [facility] ACTT team still working on placement. CSW continuing to follow for discharge needs.   Oretha Ellis, Latanya Presser, [place] Social Worker-ED [phone]

## 2016-10-09 NOTE — ED Notes (Signed)
Behavior Activity: Pacing, watching TV Attitude: Cooperative Mood/Affect: Depressed/sad Thought Process: tangential Thought Content: UTA Perceptual Disturbance: N/A Orientation: A/OX4 Hygiene: Minimal Meals:  Visitors: N/A PRN Medication: N/A

## 2016-10-09 NOTE — ED Notes (Signed)
Pt guardian visited today to discuss placement: Jeffrey [name] [phone] There are possibilities for placement but nothing firm. Pt encouraged to maintain hygiene and appropriate behavior. Pt continues to be compliant and cooperative with staff.

## 2016-10-09 NOTE — ED Provider Notes (Signed)
-----------------------------------------   5:29 AM on 10/09/2016 -----------------------------------------   Blood pressure (!) 128/110, pulse (!) 104, temperature 98.5 F (36.9 C), temperature source Oral, resp. rate 18, height  (1.753 m), weight 88.5 kg (195 lb), SpO2 100 %.  The patient had no acute events since last update.  Calm and cooperative at this time.  Disposition is pending Psychiatry/Behavioral Medicine team recommendations.     Sharyn Creamer, MD 10/09/16 [phone]

## 2016-10-09 NOTE — ED Notes (Signed)
Pt easily awakened for vital signs. Pt has been calm and cooperative this shift.

## 2016-10-09 NOTE — ED Notes (Signed)
BEHAVIORAL HEALTH ROUNDING  Patient sleeping: No.  Patient alert and oriented: yes  Behavior appropriate: Yes. ; If no, describe:  Nutrition and fluids offered: Yes  Toileting and hygiene offered: Yes  Sitter present: not applicable, Q 15 min safety rounds and observation via security camera. Law enforcement present: Yes ODS  

## 2016-10-09 NOTE — ED Notes (Addendum)
Shower and clean linens offered and lunch provided. Pt states he will shower this afternoon. Continues to remain in bed with eyes closed.

## 2016-10-10 NOTE — ED Notes (Signed)
ENVIRONMENTAL ASSESSMENT  Potentially harmful objects out of patient reach: Yes.  Personal belongings secured: Yes.  Patient dressed in hospital provided attire only: Yes.  Plastic bags out of patient reach: Yes.  Patient care equipment (cords, cables, call bells, lines, and drains) shortened, removed, or accounted for: Yes.  Equipment and supplies removed from bottom of stretcher: Yes.  Potentially toxic materials out of patient reach: Yes.  Sharps container removed or out of patient reach: Yes.   BEHAVIORAL HEALTH ROUNDING  Patient sleeping: No.  Patient alert and oriented: yes  Behavior appropriate: Yes. ; If no, describe:  Nutrition and fluids offered: Yes  Toileting and hygiene offered: Yes  Sitter present: not applicable, Q 15 min safety rounds and observation via security camera. Law enforcement present: Yes ODS  ED BHU PLACEMENT JUSTIFICATION  Is the patient under IVC or is there intent for IVC: No  Is the patient medically cleared: Yes.  Is there vacancy in the ED BHU: Yes.  Is the population mix appropriate for patient: Yes.  Is the patient awaiting placement in inpatient or outpatient setting: Yes.  Has the patient had a psychiatric consult: Yes.  Survey of unit performed for contraband, proper placement and condition of furniture, tampering with fixtures in bathroom, shower, and each patient room: Yes. ; Findings: All clear  APPEARANCE/BEHAVIOR  calm, cooperative and adequate rapport can be established  NEURO ASSESSMENT  Orientation: time, place and person  Hallucinations: No.None noted (Hallucinations)  Speech: Normal  Gait: normal  RESPIRATORY ASSESSMENT  WNL  CARDIOVASCULAR ASSESSMENT  WNL  GASTROINTESTINAL ASSESSMENT  WNL  EXTREMITIES  WNL (pt has left mid forearm amputation).  PLAN OF CARE  Provide calm/safe environment. Vital signs assessed TID. ED BHU Assessment once each 12-hour shift. Collaborate with TTS daily or as condition indicates. Assure the ED  provider has rounded once each shift. Provide and encourage hygiene. Provide redirection as needed. Assess for escalating behavior; address immediately and inform ED provider.  Assess family dynamic and appropriateness for visitation as needed: Yes. ; If necessary, describe findings:  Educate the patient/family about BHU procedures/visitation: Yes. ; If necessary, describe findings: Pt is calm and cooperative at this time. Pt understanding and accepting of unit procedures/rules. Will continue to monitor with Q 15 min safety rounds and observation via security camera.

## 2016-10-10 NOTE — ED Notes (Signed)
Behavior Activity: watching TV Attitude: Cooperative Mood/Affect: Depressed/sad Thought Process: WNL Thought Content:  Perceptual Disturbance: N/A Orientation: A/OX4 Hygiene: Minimal Meals: Dinner provided Visitors: N/A PRN Medication: N/A

## 2016-10-10 NOTE — ED Notes (Signed)
Behavior Activity: Lying in bed with eyes closed. Appears to be asleep Attitude: Cooperative Mood/Affect: UTA Thought Process: UTA Thought Content: UTA Perceptual Disturbance: UTA Orientation: UTA Hygiene: N/A Meals: N/A Visitors: N/A PRN Medication: N/A  

## 2016-10-10 NOTE — ED Notes (Signed)
Pt given sandwich tray and drink. Pt is calm and cooperative at this time. Sitting at times in the day room watching TV. Will continue to monitor.

## 2016-10-10 NOTE — ED Notes (Signed)
BEHAVIORAL HEALTH ROUNDING  Patient sleeping: No.  Patient alert and oriented: yes  Behavior appropriate: Yes. ; If no, describe:  Nutrition and fluids offered: Yes  Toileting and hygiene offered: Yes  Sitter present: not applicable, Q 15 min safety rounds and observation via security camera. Law enforcement present: Yes ODS  

## 2016-10-10 NOTE — ED Notes (Signed)

## 2016-10-10 NOTE — ED Notes (Signed)
BEHAVIORAL HEALTH ROUNDING Patient sleeping: Yes.   Patient alert and oriented: not applicable SLEEPING Behavior appropriate: Yes.  ; If no, describe: SLEEPING Nutrition and fluids offered: No SLEEPING Toileting and hygiene offered: NoSLEEPING Sitter present: not applicable, Q 15 min safety rounds and observation via security camera. Law enforcement present: Yes ODS 

## 2016-10-10 NOTE — ED Notes (Signed)
Behavior Activity: watching TV. Encouraged shower but pt declined.  Attitude: Cooperative Mood/Affect: Depressed/sad Thought Process: WNL Thought Content:  Perceptual Disturbance: N/A Orientation: A/OX4 Hygiene: Minimal Meals: Dinner provided Visitors: N/A PRN Medication: N/A

## 2016-10-10 NOTE — ED Provider Notes (Signed)
-----------------------------------------   6:25 AM on 10/10/2016 -----------------------------------------   Blood pressure 117/63, pulse 91, temperature 98.3 F (36.8 C), temperature source Oral, resp. rate 18, height  (1.753 m), weight 88.5 kg (195 lb), SpO2 100 %.  The patient had no acute events since last update.  Sleeping at this time.  Disposition is pending Psychiatry/Behavioral Medicine team recommendations.     Irean Hong, MD 10/10/16 [phone]

## 2016-10-10 NOTE — ED Notes (Signed)
Pt. Alert and oriented, warm and dry, in no distress. Pt. Denies SI, HI, and AVH. Pt. Encouraged to let nursing staff know of any concerns or needs. 

## 2016-10-10 NOTE — Clinical Social Work Note (Signed)
CSW received phone call from [facility] [phone], she stated there is a group home in [place] who is reviewing patient.  Jasmine requested Psychiatry note to be faxed to [phone].  CSW faxed requested psychiatry note.  ED social worker continuing to follow patient's progress throughout discharge planning.  Ervin Knack. Jennessy Sandridge, MSW, Theresia Majors [phone]  10/10/2016 5:49 PM

## 2016-10-10 NOTE — ED Notes (Signed)
VOL  PENDING  PLACEMENT 

## 2016-10-11 NOTE — ED Provider Notes (Signed)
-----------------------------------------   8:15 AM on 10/11/2016 -----------------------------------------   BP 116/72 (BP Location: Right Arm)   Pulse 71   Temp (!) 97.4 F (36.3 C) (Oral)   Resp 18   Ht  (1.753 m)   Wt 88.5 kg (195 lb)   SpO2 100%   BMI 28.80 kg/m   No acute events since last update. Disposition is pending per Psychiatry/Behavioral Medicine team recommendations.     Phineas Semen, MD 10/11/16 [phone]

## 2016-10-11 NOTE — ED Notes (Addendum)
Dinner tray brought for patient. Pt has been calm and cooperative with no behavioral issues, but also has been withdrawn to his room for the majority of the day, only getting out of bed to use the bathroom.

## 2016-10-11 NOTE — ED Notes (Signed)
BEHAVIORAL HEALTH ROUNDING Patient sleeping: Yes.   Patient alert and oriented: not applicable SLEEPING Behavior appropriate: Yes.  ; If no, describe: SLEEPING Nutrition and fluids offered: No SLEEPING Toileting and hygiene offered: NoSLEEPING Sitter present: not applicable, Q 15 min safety rounds and observation via security camera. Law enforcement present: Yes ODS 

## 2016-10-11 NOTE — ED Notes (Signed)
Pt. Alert and oriented, warm and dry, in no distress. Pt. Denies SI, HI, and AVH. Pt. Encouraged to let nursing staff know of any concerns or needs. 

## 2016-10-11 NOTE — ED Notes (Signed)
Brought lunch tray to patient. Pt states, "thank you", and "maa'm, I will be leaving one day, won't I?" Reassured pt, then pt proceeds to lay back down and shut his eyes.

## 2016-10-11 NOTE — ED Notes (Signed)

## 2016-10-11 NOTE — ED Notes (Addendum)
Patient easily aroused from sleep for breakfast and meds Vital signs stable. Pt calm, cooperative and compliant with meds. Pt asks, "maam, when am I going to get out of here?" Pt remains safe with 15 minute checks

## 2016-10-12 NOTE — ED Provider Notes (Signed)
-----------------------------------------   3:51 AM on 10/12/2016 -----------------------------------------   Blood pressure 117/72, pulse (!) 101, temperature 98.6 F (37 C), temperature source Oral, resp. rate 18, height  (1.753 m), weight 88.5 kg (195 lb), SpO2 100 %.  The patient had no acute events since last update.  Calm and cooperative at this time.  Disposition is pending Psychiatry/Behavioral Medicine team recommendations.     Myrna Blazer, MD 10/12/16 [phone]

## 2016-10-12 NOTE — ED Notes (Signed)
Pt wanting staff to call old group home so he can apologize and return home.  RN reminded patient social work is working on placing him in a new group home. Pt accepting.  Maintained on 15 minute checks and observation by security camera for safety.

## 2016-10-12 NOTE — ED Notes (Signed)
Patient resting quietly in room. No noted distress or abnormal behaviors noted. Will continue 15 minute checks and observation by security camera for safety. 

## 2016-10-12 NOTE — ED Notes (Signed)
VOL/Pending placement 

## 2016-10-12 NOTE — ED Notes (Signed)
Pt. Alert and oriented, warm and dry, in no distress. Pt. Denies SI, HI, and AVH. Pt. Encouraged to let nursing staff know of any concerns or needs. 

## 2016-10-12 NOTE — ED Notes (Signed)
Patient asleep in room. No noted distress or abnormal behavior. Will continue 15 minute checks and observation by security cameras for safety. 

## 2016-10-12 NOTE — ED Notes (Signed)
voluntary 

## 2016-10-12 NOTE — ED Notes (Signed)

## 2016-10-12 NOTE — ED Notes (Signed)
Pt calm, cooperative. No agitation. Pleasant with staff and other patients. Maintained on 15 minute checks and observation by security camera for safety.

## 2016-10-13 NOTE — ED Notes (Signed)
Patient incontinent- seen in camera stripping his sheets. When pt asked if he needed clean sheets, he replied in an irritated tone, "just let me sleep, I'm thinking about someone", and proceeds to lie down and cover himself with his blanket on a bare mattress. Urine soiled linens removed from room. Pt resting quietly with eyes closed.

## 2016-10-13 NOTE — ED Notes (Signed)
Patient refused vitals, stating, "I just want to sleep. Leave me alone."

## 2016-10-13 NOTE — ED Notes (Signed)
Patient brought dinner 

## 2016-10-13 NOTE — ED Notes (Signed)
VOL  PENDING  PLACEMENT 

## 2016-10-13 NOTE — ED Provider Notes (Signed)
-----------------------------------------   7:17 AM on 10/13/2016 -----------------------------------------   Blood pressure 106/64, pulse 74, temperature 97.9 F (36.6 C), temperature source Oral, resp. rate 16, height  (1.753 m), weight 88.5 kg (195 lb), SpO2 100 %.  The patient had no acute events since last update.  Calm and cooperative at this time.  Disposition is pending Psychiatry/Behavioral Medicine team recommendations.     Jeanmarie Plant, MD 10/13/16 [phone]

## 2016-10-13 NOTE — ED Notes (Signed)
Pt. Alert and oriented, warm and dry, in no distress. Pt. Denies SI, HI, and AVH. Pt. Encouraged to let nursing staff know of any concerns or needs. 

## 2016-10-13 NOTE — Clinical Social Work Note (Signed)
CSW attempted to contact [facility] again to see what the group home in [facility] said.  CSW left message on voice mail, awaiting call back.  Ervin Knack. Martrell Eguia, MSW, Theresia Majors [phone]  10/13/2016 4:17 PM

## 2016-10-13 NOTE — ED Notes (Signed)

## 2016-10-14 NOTE — ED Notes (Signed)
Patient awake sitting up in bed watching tv upon initial approach. Pt requesting breakfast, stating, "I'm hungry this morning. Is breakfast soon?"Pt reassured that breakfast will be coming any time. Pt appreciative, responding, "thank you, maam."

## 2016-10-14 NOTE — Clinical Social Work Note (Signed)
Pt still awaiting placement. Sil[facility] Flood and [facility] team still awaiting decision from several group homes. CSW continuing to assist with discharge needs.   Corlis Hove, Theresia Majors, [facility] Clinical Social Worker-ED [phone]

## 2016-10-14 NOTE — ED Notes (Signed)
Pt has been calm, cooperative and compliant with meds throughout the shift with no behavioral issues. Pt has been up and about in the mileu a few times throughout the day, responding to internal stimuli. Pt remains safe with 15 minute checks

## 2016-10-14 NOTE — ED Provider Notes (Signed)
-----------------------------------------   7:33 AM on 10/14/2016 -----------------------------------------   Blood pressure 109/69, pulse 82, temperature 97.8 F (36.6 C), temperature source Oral, resp. rate 16, height  (1.753 m), weight 88.5 kg (195 lb), SpO2 100 %.  The patient had no acute events since last update.  Calm and cooperative at this time.  Disposition is pending Psychiatry/Behavioral Medicine team recommendations.     Myrna Blazer, MD 10/14/16 [phone]

## 2016-10-14 NOTE — ED Notes (Signed)
Pt. Alert and oriented, warm and dry, in no distress. Pt. Denies SI, HI, and AVH. Pt. Encouraged to let nursing staff know of any concerns or needs. 

## 2016-10-14 NOTE — ED Notes (Signed)

## 2016-10-15 LAB — CBC WITH DIFFERENTIAL/PLATELET
Basophils Absolute: 0 10*3/uL (ref 0–0.1)
Basophils Relative: 1 %
Eosinophils Absolute: 0 10*3/uL (ref 0–0.7)
Eosinophils Relative: 0 %
HEMATOCRIT: 38.3 % — AB (ref 40.0–52.0)
HEMOGLOBIN: 13.5 g/dL (ref 13.0–18.0)
LYMPHS ABS: 1.5 10*3/uL (ref 1.0–3.6)
LYMPHS PCT: 29 %
MCH: 31.4 pg (ref 26.0–34.0)
MCHC: 35.2 g/dL (ref 32.0–36.0)
MCV: 89.2 fL (ref 80.0–100.0)
MONO ABS: 0.4 10*3/uL (ref 0.2–1.0)
MONOS PCT: 9 %
NEUTROS ABS: 3.2 10*3/uL (ref 1.4–6.5)
NEUTROS PCT: 61 %
Platelets: 173 10*3/uL (ref 150–440)
RBC: 4.29 MIL/uL — ABNORMAL LOW (ref 4.40–5.90)
RDW: 14.2 % (ref 11.5–14.5)
WBC: 5.2 10*3/uL (ref 3.8–10.6)

## 2016-10-15 NOTE — ED Notes (Signed)
Pt given breakfast tray. Pt resting comfortably. Maintained on 15 minute checks and observation by security camera for safety.

## 2016-10-15 NOTE — ED Notes (Signed)
Pt stated he would comply with blood draw. Lab called.

## 2016-10-15 NOTE — ED Notes (Signed)
Pt vol  Pending  placement

## 2016-10-15 NOTE — Clinical Social Work Note (Signed)
Pt still awaiting placement. [facility] and [facility] team still awaiting decision from several group homes. CSW continuing to assist with discharge needs.   Augustine Brannick, LCSWA, LCASA Clinical Social Worker-ED [phone] 

## 2016-10-15 NOTE — ED Provider Notes (Signed)
-----------------------------------------   7:59 AM on 10/15/2016 -----------------------------------------   Blood pressure 116/61, pulse 73, temperature 98.1 F (36.7 C), temperature source Oral, resp. rate 20, height  (1.753 m), weight 88.5 kg (195 lb), SpO2 100 %.  The patient had no acute events since last update.  Calm and cooperative at this time.  Disposition is pending Psychiatry/Behavioral Medicine team recommendations.     Sharyn Creamer, MD 10/15/16 [phone]

## 2016-10-15 NOTE — ED Notes (Signed)
Pt complaint with lab blood draw.  Pt continuing to ask how long he will have to stay in the hospital. No agitation. Pleasant with staff.  Maintained on 15 minute checks and observation by security camera for safety.

## 2016-10-15 NOTE — ED Notes (Signed)
Pt will not consent to a blood draw at this time. Pt wants to be left alone to rest.

## 2016-10-15 NOTE — ED Notes (Signed)
Pt. Alert and oriented, warm and dry, in no distress. Pt. Denies SI, HI, and AVH. Pt. Encouraged to let nursing staff know of any concerns or needs. 

## 2016-10-15 NOTE — ED Notes (Signed)

## 2016-10-16 DIAGNOSIS — F203 Undifferentiated schizophrenia: Secondary | ICD-10-CM | POA: Diagnosis not present
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
## 2016-10-16 NOTE — ED Notes (Signed)
ENVIRONMENTAL ASSESSMENT  Potentially harmful objects out of patient reach: Yes.  Personal belongings secured: Yes.  Patient dressed in hospital provided attire only: Yes.  Plastic bags out of patient reach: Yes.  Patient care equipment (cords, cables, call bells, lines, and drains) shortened, removed, or accounted for: Yes.  Equipment and supplies removed from bottom of stretcher: Yes.  Potentially toxic materials out of patient reach: Yes.  Sharps container removed or out of patient reach: Yes.   BEHAVIORAL HEALTH ROUNDING  Patient sleeping: No.  Patient alert and oriented: yes  Behavior appropriate: Yes. ; If no, describe:  Nutrition and fluids offered: Yes  Toileting and hygiene offered: Yes  Sitter present: not applicable, Q 15 min safety rounds and observation via security camera. Law enforcement present: Yes ODS  ED BHU PLACEMENT JUSTIFICATION  Is the patient under IVC or is there intent for IVC: No.  Is the patient medically cleared: Yes.  Is there vacancy in the ED BHU: Yes.  Is the population mix appropriate for patient: Yes.  Is the patient awaiting placement in inpatient or outpatient setting: Yes.  Has the patient had a psychiatric consult: Yes.  Survey of unit performed for contraband, proper placement and condition of furniture, tampering with fixtures in bathroom, shower, and each patient room: Yes. ; Findings: All clear  APPEARANCE/BEHAVIOR  calm, cooperative and adequate rapport can be established  NEURO ASSESSMENT  Orientation: time, place and person  Hallucinations: No.None noted (Hallucinations)  Speech: Normal  Gait: normal  RESPIRATORY ASSESSMENT  WNL  CARDIOVASCULAR ASSESSMENT  WNL  GASTROINTESTINAL ASSESSMENT  WNL  EXTREMITIES  WNL (left forearm amputation) PLAN OF CARE  Provide calm/safe environment. Vital signs assessed TID. ED BHU Assessment once each 12-hour shift. Collaborate with TTS daily or as condition indicates. Assure the ED provider has  rounded once each shift. Provide and encourage hygiene. Provide redirection as needed. Assess for escalating behavior; address immediately and inform ED provider.  Assess family dynamic and appropriateness for visitation as needed: Yes. ; If necessary, describe findings:  Educate the patient/family about BHU procedures/visitation: Yes. ; If necessary, describe findings: Pt is calm and cooperative at this time. Pt understanding and accepting of unit procedures/rules. Will continue to monitor with Q 15 min safety rounds and observation via security camera.

## 2016-10-16 NOTE — ED Notes (Signed)
BEHAVIORAL HEALTH ROUNDING  Patient sleeping: No.  Patient alert and oriented: yes  Behavior appropriate: Yes. ; If no, describe:  Nutrition and fluids offered: Yes  Toileting and hygiene offered: Yes  Sitter present: not applicable, Q 15 min safety rounds and observation via security camera. Law enforcement present: Yes ODS  

## 2016-10-16 NOTE — ED Notes (Signed)
Pt in dayroom watching TV. No noted distress or abnormal behaviors noted. Will continue 15 minute checks and observation by security camera for safety.
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
## 2016-10-16 NOTE — Clinical Social Work Note (Signed)
Per psychiatrist note, pt continues to not be appropriate for inpatient psychiatric admission. CSW spoke with Silas Flood [phone]) with [facility], who states pt has been turned down by every facility pursued thus far, as has this CSW. [name] now extending his search beyond local counties. Additionally, CSW received denial from [facility] for pt placement. Guardian, ACTT Team, and CSW continuing to pursue placement for pt. CSW staffed case with Social Work Asst. Audiological scientist. CSW continuing to follow for placement needs.   Corlis Hove, Theresia Majors, [facility] Clinical Social Worker-ED [phone]

## 2016-10-16 NOTE — ED Notes (Signed)
BEHAVIORAL HEALTH ROUNDING Patient sleeping: Yes.   Patient alert and oriented: not applicable SLEEPING Behavior appropriate: Yes.  ; If no, describe: SLEEPING Nutrition and fluids offered: No SLEEPING Toileting and hygiene offered: NoSLEEPING Sitter present: not applicable, Q 15 min safety rounds and observation via security camera. Law enforcement present: Yes ODS 

## 2016-10-16 NOTE — ED Notes (Signed)
Pt to nurses station door asking if he was the only patient. "You are gonna let me leave, aren't you? I wanna go outside and smoke a cigarette."   Pt re-assured staff are working on finding him new placement.  Pt accepting and returned to his room. Maintained on 15 minute checks and observation by security camera for safety.

## 2016-10-16 NOTE — ED Provider Notes (Signed)
-----------------------------------------   7:15 AM on 10/16/2016 -----------------------------------------   Blood pressure 125/71, pulse 85, temperature 97.8 F (36.6 C), temperature source Oral, resp. rate 18, height  (1.753 m), weight 88.5 kg (195 lb), SpO2 100 %.  The patient had no acute events since last update.  Calm and cooperative at this time.  Disposition is pending per Psychiatry/Behavioral Medicine team recommendations.     Don Perking, [place], MD 10/16/16 [phone]

## 2016-10-16 NOTE — ED Notes (Signed)
Patient refused vitals.

## 2016-10-17 NOTE — ED Notes (Signed)
Hourly rounding reveals patient sleeping in room. No complaints, stable, in no acute distress. Q15 minute rounds and monitoring via Security Cameras to continue. 

## 2016-10-17 NOTE — ED Notes (Signed)
Report to include Situation, Background, Assessment, and Recommendations received from Amy RN. Patient alert and oriented, warm and dry, in no acute distress. Patient denies SI, HI, refuses to answer about AVH and pain. States I just want you to let me sleep. Patient made aware of Q15 minute rounds and security cameras for their safety. Patient instructed to come to me with needs or concerns.l

## 2016-10-17 NOTE — Clinical Social Work Note (Signed)
Per psychiatrist note, pt continues to not be appropriate for inpatient psychiatric admission. CSW staffed case with Social Work Asst. Audiological scientist, who connected with Lurena Joiner [phone] ) of [facility] in [place], [place]. CSW faxed clinicals to [phone]. Lurena Joiner to come to [facility] on Monday 10/21/16 to assess pt for possible placement.   CSWupdated Guardian Corwin Levins [phone]) with [facility], who states he feels this facility would be a good fit for pt. [name] to call Lurena Joiner tomorrow morning. CSW also left a message with [facility] [phone]) of [facility] for ACTT Team worker [place]. No further placement options identified. CSW, Guardian, and ACTT Team continuing to pursue placement for pt. CSW continuing to follow.   Corlis Hove, Theresia Majors, [facility] Clinical Social Worker-ED [phone]

## 2016-10-17 NOTE — ED Notes (Signed)
Patient asleep in room. No noted distress or abnormal behavior. Will continue 15 minute checks and observation by security cameras for safety. 

## 2016-10-17 NOTE — ED Notes (Signed)
PT  VOL/  PENDING  PLACEMENT 

## 2016-10-17 NOTE — ED Notes (Signed)
Pt irritable upon approach. "You have to take my blood pressure again? I just want to be left alone to rest."    Pt was agreeable taking mediations. RN held lopressor. RN informed patient she had spoken to LCSW and she is working to find a facility with a bed available.  Pt also re-assured the plan to not to keep him here forever. Pt accepting. "Thank you ma'am."   Maintained on 15 minute checks and observation by security camera for safety.

## 2016-10-17 NOTE — ED Provider Notes (Signed)
-----------------------------------------   6:43 AM on 10/17/2016 -----------------------------------------   Blood pressure 103/66, pulse 94, temperature 97.7 F (36.5 C), temperature source Oral, resp. rate 18, height  (1.753 m), weight 88.5 kg (195 lb), SpO2 98 %.  The patient had no acute events since last update.  Sleeping at this time.  Disposition is pending CSW/Psychiatry/Behavioral Medicine team recommendations.     Irean Hong, MD 10/17/16 [phone]

## 2016-10-17 NOTE — ED Notes (Signed)
BEHAVIORAL HEALTH ROUNDING Patient sleeping: Yes.   Patient alert and oriented: not applicable SLEEPING Behavior appropriate: Yes.  ; If no, describe: SLEEPING Nutrition and fluids offered: No SLEEPING Toileting and hygiene offered: NoSLEEPING Sitter present: not applicable, Q 15 min safety rounds and observation via security camera. Law enforcement present: Yes ODS 

## 2016-10-18 NOTE — ED Notes (Signed)
Hourly rounding reveals patient sleeping room. No complaints, stable, in no acute distress. Q15 minute rounds and monitoring via Security Cameras to continue. 

## 2016-10-18 NOTE — ED Notes (Signed)
Hourly rounding reveals patient sleeping in room. No complaints, stable, in no acute distress. Q15 minute rounds and monitoring via Security Cameras to continue. 

## 2016-10-18 NOTE — ED Notes (Signed)
Report to oncoming shift.

## 2016-10-18 NOTE — ED Notes (Signed)
Pt initially refused lab draw but did allow phlebotomist to draw specimen after some cajoling. Pt was appropriate but grumpy. Will continue to monitor for needs/safety.

## 2016-10-18 NOTE — ED Notes (Signed)
Patient is voluntary and is pending placement. 

## 2016-10-18 NOTE — ED Notes (Signed)
Report was received from Colin Broach., RN; Pt. Verbalizes  complaints of why he hasn't been placed; distress of waiting for placement; denies S.I./Hi. Continue to monitor with 15 min. Monitoring.

## 2016-10-18 NOTE — ED Provider Notes (Signed)
-----------------------------------------   5:58 AM on 10/18/2016 -----------------------------------------   Blood pressure 103/66, pulse 94, temperature 97.7 F (36.5 C), temperature source Oral, resp. rate 18, height  (1.753 m), weight 88.5 kg (195 lb), SpO2 98 %.  The patient had no acute events since last update.  Calm and cooperative at this time.  Disposition is pending per Psychiatry/Behavioral Medicine team recommendations.     Don Perking, [place], MD 10/18/16 [phone]

## 2016-10-18 NOTE — ED Notes (Signed)

## 2016-10-19 MED ORDER — DOCUSATE SODIUM 100 MG PO CAPS
ORAL_CAPSULE | ORAL | Status: AC
  Filled 2016-10-19: qty 1

## 2016-10-19 NOTE — ED Notes (Signed)
Report was received from Colin Broach., RN; Pt. Verbalizes  Complaints of wanting placement found;  Distress of waiting; denies S.I./Hi. Continue to monitor with 15 min. Monitoring.

## 2016-10-19 NOTE — ED Notes (Signed)
Patient received PM snack. 

## 2016-10-19 NOTE — ED Notes (Signed)
Pt has been resting with regular/even/unlabored respirations all morning. He has not awakened for meals -- lunch and breakfast have been put at bedside. Despite staff urgings, he just rolls over and continues to sleep. Pt remains safe with checks, rounds, and camera monitoring.

## 2016-10-19 NOTE — ED Notes (Addendum)
Breakfast tray given. °

## 2016-10-19 NOTE — ED Notes (Signed)
Pt has been sleeping since not longer after eating dinner. He did not want to get up. He was pleasant but forwarded little.

## 2016-10-19 NOTE — ED Notes (Signed)
Pt Voluntary pending placement. 

## 2016-10-19 NOTE — ED Notes (Signed)

## 2016-10-19 NOTE — ED Provider Notes (Signed)
-----------------------------------------   7:27 AM on 10/19/2016 -----------------------------------------   Blood pressure 112/62, pulse 79, temperature (!) 97.4 F (36.3 C), temperature source Tympanic, resp. rate 16, height  (1.753 m), weight 88.5 kg (195 lb), SpO2 98 %.  The patient had no acute events since last update.  Calm and cooperative at this time.  Disposition is pending Psychiatry/Behavioral Medicine team recommendations.     Irean Hong, MD 10/19/16 [phone]

## 2016-10-20 NOTE — ED Notes (Signed)

## 2016-10-20 NOTE — ED Provider Notes (Signed)
-----------------------------------------   7:57 AM on 10/20/2016 -----------------------------------------   Blood pressure 125/79, pulse 64, temperature 98.3 F (36.8 C), temperature source Oral, resp. rate 16, height  (1.753 m), weight 88.5 kg (195 lb), SpO2 100 %.  The patient had no acute events since last update.  Patient has been evaluated by psychiatry they do not believe the patient requires psychiatric admission. Currently awaiting placement to a assisted living facility. Patient's medical workup has been largely nonrevealing.    Minna Antis, MD 10/20/16 [phone]

## 2016-10-20 NOTE — ED Notes (Signed)
Patient alert and oriented. Patient with no concerns voiced. Patient taking medications as ordered. Patient calm and cooperative this shift at present time; will continue to monitor. Patient took shower today and bed linen was changed. Patient with bright affect today up and awake at 7 am this morning. Patient in pleasant mood. Patient denies SI/HI and A/V/H. Monitoring continues.

## 2016-10-20 NOTE — Progress Notes (Signed)
CH was on unit with another patient. CH spoke with Jeffrey [name] as he watched college football. Patient stated he was in a better place and feels like his medication needs changing. Patient spoke about his love of cars and how he has trouble with being outside. CH offered prayer and emotional support.    10/20/16 1000  Clinical Encounter Type  Visited With Patient  Visit Type Initial;Spiritual support;Behavioral Health;ED  Referral From Nurse

## 2016-10-20 NOTE — ED Notes (Signed)
Pt. Alert and oriented, warm and dry, in no distress. Pt. Denies SI, HI, and AVH. Pt. Encouraged to let nursing staff know of any concerns or needs. 

## 2016-10-20 NOTE — ED Notes (Signed)
Patient observed in room lying on bed looking at television. Patient voices no concerns. Monitoring continues.

## 2016-10-20 NOTE — ED Notes (Signed)
Sandwich and soft drink given.  

## 2016-10-20 NOTE — ED Notes (Signed)
Patient is voluntary and is pending placement. 

## 2016-10-20 NOTE — ED Notes (Signed)
PT  VOL/  PENDING  PLACEMENT 

## 2016-10-20 NOTE — ED Notes (Signed)
Patient taking a shower. Patient bed linen was changed.

## 2016-10-21 NOTE — ED Notes (Signed)
PT  VOL/  PENDING  PLACEMENT 

## 2016-10-21 NOTE — ED Notes (Signed)
Patient asking this Clinical research associate and other RN in unit to please find him a place to live.

## 2016-10-21 NOTE — ED Notes (Signed)
Pt has been in bed all day. Has not eaten any of his meal trays, minimal drinks consumed. RN attempted to get another set of VS, but pt refused. Maintained on 15 minute checks and observation by security camera for safety.

## 2016-10-21 NOTE — ED Notes (Signed)
Lunch tray given to patient

## 2016-10-21 NOTE — ED Notes (Signed)
Patient asleep in room. No noted distress or abnormal behavior. Will continue 15 minute checks and observation by security cameras for safety. 

## 2016-10-21 NOTE — ED Notes (Signed)
Pt awakened by RN. Pt compliant with VS and medications. RN spoke with Dr. Toni Amend about current medications. Lopressor and metformin discontinued at this time. Maintained on 15 minute checks and observation by security camera for safety.

## 2016-10-21 NOTE — ED Notes (Signed)
Pt continues to sleep. RN has encouraged patient to drink and eat his lunch tray. Pt keeps stating, "I will in a little bit."  Maintained on 15 minute checks and observation by security camera for safety.

## 2016-10-21 NOTE — ED Provider Notes (Signed)
-----------------------------------------   6:51 AM on 10/21/2016 -----------------------------------------   Blood pressure 107/72, pulse 91, temperature 98.4 F (36.9 C), temperature source Oral, resp. rate 18, height 1.753 m ( ), weight 88.5 kg (195 lb), SpO2 99 %.  The patient had no acute events since last update.  Calm and cooperative at this time.  Pending placement at an assisted living facility.     Loleta Rose, MD 10/21/16 [phone]

## 2016-10-21 NOTE — Consult Note (Signed)
  [name] blood pressure has been stable for a while and today he is also bradycardic. Hasn't reported any symptoms but I reviewed this with nursing and we agree that holding his blood pressure medicine for now is probably more reasonable. I have gone ahead and discontinued the Lopressor and then we can just keep an eye on how his blood pressure does. Part of this may be because he does not stay well hydrated.

## 2016-10-21 NOTE — ED Notes (Signed)
Breakfast tray placed in room ?

## 2016-10-21 NOTE — ED Notes (Signed)
Pt. Alert and oriented, warm and dry, in no distress. Pt. Denies SI, HI, and AVH. Pt. Encouraged to let nursing staff know of any concerns or needs. 

## 2016-10-21 NOTE — Clinical Social Work Note (Signed)
CSW spoke with Lurena Joiner [phone] ) of [facility] in [place], [place], who declined admission stating pt not appropriate for her facility. Received VM from [facility] ACTT Team worker Geraldo Docker stating they are awaiting a decision from [place] at [facility]. CSW, Guardian, and ACTT Team continuing to pursue placement for pt. CSW continuing to follow.   Corlis Hove, Theresia Majors, [facility] Clinical Social Worker-ED [phone]

## 2016-10-21 NOTE — ED Notes (Signed)

## 2016-10-22 LAB — CBC WITH DIFFERENTIAL/PLATELET
BASOS ABS: 0 10*3/uL (ref 0–0.1)
Basophils Relative: 0 %
Eosinophils Absolute: 0 10*3/uL (ref 0–0.7)
Eosinophils Relative: 0 %
HEMATOCRIT: 38.8 % — AB (ref 40.0–52.0)
HEMOGLOBIN: 13.6 g/dL (ref 13.0–18.0)
LYMPHS PCT: 24 %
Lymphs Abs: 1.5 10*3/uL (ref 1.0–3.6)
MCH: 31.2 pg (ref 26.0–34.0)
MCHC: 35 g/dL (ref 32.0–36.0)
MCV: 89.1 fL (ref 80.0–100.0)
MONO ABS: 0.4 10*3/uL (ref 0.2–1.0)
MONOS PCT: 6 %
NEUTROS ABS: 4.4 10*3/uL (ref 1.4–6.5)
Neutrophils Relative %: 70 %
Platelets: 154 10*3/uL (ref 150–440)
RBC: 4.35 MIL/uL — ABNORMAL LOW (ref 4.40–5.90)
RDW: 14.1 % (ref 11.5–14.5)
WBC: 6.2 10*3/uL (ref 3.8–10.6)

## 2016-10-22 NOTE — Clinical Social Work Note (Signed)
CSW staffed case with AD Zack. CSW spoke with Jinny Sanders [phone]) , who is considering admission for pt at her transition independent living home. CSW updated Guardian -Corwin Levins and he is in agreement with placement option. CSW connected guardian with Jinny Sanders. Per guardian-Ms Davis to assess pt on Wendesday or Thursday. CSW, Guardian, and ACTT Team continuing to pursue placement for pt. CSW continuing to follow.   Corlis Hove, Theresia Majors, [facility] Clinical Social Worker-ED [phone]

## 2016-10-22 NOTE — ED Notes (Signed)
Patient resting quietly in room. No noted distress or abnormal behaviors noted. Will continue 15 minute checks and observation by security camera for safety. 

## 2016-10-22 NOTE — ED Notes (Signed)
Pt awakened by RN. Compliant with medications. Pt has not eaten breakfast or lunch at this time, stated he wanted to rest. Maintained on 15 minute checks and observation by security camera for safety.

## 2016-10-22 NOTE — ED Notes (Signed)
Pt took a shower during shift.

## 2016-10-22 NOTE — ED Notes (Signed)
CBC drawn. Pt calm and cooperative. Declined a shower at this time. Maintained on 15 minute checks and observation by security camera for safety.

## 2016-10-22 NOTE — ED Notes (Signed)
Pt. Alert and oriented, warm and dry, in no distress. Pt. Denies SI, HI, and AVH. Pt. Encouraged to let nursing staff know of any concerns or needs. 

## 2016-10-22 NOTE — ED Notes (Signed)

## 2016-10-22 NOTE — ED Notes (Signed)
Breakfast tray placed in room ?

## 2016-10-22 NOTE — ED Provider Notes (Signed)
-----------------------------------------   8:10 AM on 10/22/2016 -----------------------------------------   Blood pressure 111/76, pulse 88, temperature 98.5 F (36.9 C), temperature source Oral, resp. rate 16, height  (1.753 m), weight 88.5 kg (195 lb), SpO2 100 %.  The patient had no acute events since last update.  Calm and cooperative at this time.  The patient is currently awaiting social work placement and assignment to a facility.     Rebecka Apley, MD 10/22/16 [phone]

## 2016-10-22 NOTE — ED Notes (Signed)
Pt refusing blood draw. RN will encourage patient to allow lab work later this afternoon.

## 2016-10-23 NOTE — ED Notes (Signed)
Pt given lunch tray.

## 2016-10-23 NOTE — ED Provider Notes (Signed)
-----------------------------------------   6:18 AM on 10/23/2016 -----------------------------------------   Blood pressure 111/76, pulse 88, temperature 98.5 F (36.9 C), temperature source Oral, resp. rate 16, height  (1.753 m), weight 88.5 kg (195 lb), SpO2 100 %.  The patient had no acute events since last update.  Sleeping at this time.  Disposition is pending Psychiatry/Behavioral Medicine team recommendations.     Irean Hong, MD 10/23/16 [phone]

## 2016-10-23 NOTE — ED Notes (Signed)
Patient refused vitals. Patient states, "I just want to sleep right now, you can do them  later."

## 2016-10-23 NOTE — ED Notes (Signed)
Patient asleep in room. No noted distress or abnormal behavior. Will continue 15 minute checks and observation by security cameras for safety. 

## 2016-10-23 NOTE — ED Notes (Signed)
Pt given dinner tray.

## 2016-10-23 NOTE — Clinical Social Work Note (Signed)
CSW met with pt at bedside this AM. CSW informed pt that Kendra Opitz is set to come and assess pt today or tomorrow (Thursday). CSW confirmed with pt that placement is being sought. Pt thankful for update.  CSW left voicemail for Kendra Opitz [phone]) , who is considering admission for pt at her transition independent living home. Guardian is agreeable if offer is made by Ms. Davis. CSW, Guardian, and ACTT Team continuing to pursue placement for pt. CSW continuing to follow.   Jeffrey [name], Jeffrey [name], [place] Social Worker-ED [phone]

## 2016-10-23 NOTE — ED Notes (Signed)
Breakfast tray placed in room ?

## 2016-10-24 DIAGNOSIS — F203 Undifferentiated schizophrenia: Secondary | ICD-10-CM | POA: Diagnosis not present

## 2016-10-24 MED ORDER — SIMVASTATIN 40 MG PO TABS: 40.0000 mg | ORAL_TABLET | Freq: Every day | ORAL | 0 refills | Status: DC

## 2016-10-24 MED ORDER — CLOZAPINE 100 MG PO TABS: 100.0000 mg | ORAL_TABLET | Freq: Every day | ORAL | 0 refills | Status: AC

## 2016-10-24 MED ORDER — DOCUSATE SODIUM 100 MG PO CAPS: 200.0000 mg | ORAL_CAPSULE | Freq: Two times a day (BID) | ORAL | 0 refills | Status: AC

## 2016-10-24 MED ORDER — DIVALPROEX SODIUM 500 MG PO DR TAB: 1000.0000 mg | DELAYED_RELEASE_TABLET | Freq: Two times a day (BID) | ORAL | 0 refills | Status: DC

## 2016-10-24 MED ORDER — FLUOXETINE HCL 10 MG PO CAPS: 10.0000 mg | ORAL_CAPSULE | Freq: Every day | ORAL | 0 refills | Status: AC

## 2016-10-24 MED ORDER — CHLORPROMAZINE HCL 100 MG PO TABS: 100.0000 mg | ORAL_TABLET | Freq: Four times a day (QID) | ORAL | 0 refills | Status: AC | PRN

## 2016-10-24 MED ORDER — DESMOPRESSIN ACETATE 0.2 MG PO TABS: 0.2000 mg | ORAL_TABLET | Freq: Every day | ORAL | 0 refills | Status: AC

## 2016-10-24 MED ORDER — TEMAZEPAM 7.5 MG PO CAPS: 7.5000 mg | ORAL_CAPSULE | Freq: Every day | ORAL | 0 refills | Status: AC

## 2016-10-24 MED ORDER — CLOZAPINE 200 MG PO TABS: 400.0000 mg | ORAL_TABLET | Freq: Every day | ORAL | 0 refills | Status: AC

## 2016-10-24 MED ORDER — SENNA 8.6 MG PO TABS: 2.0000 | ORAL_TABLET | Freq: Every day | ORAL | 0 refills | Status: AC

## 2016-10-24 MED ORDER — MOMETASONE FURO-FORMOTEROL FUM 200-5 MCG/ACT IN AERO: 2.0000 | INHALATION_SPRAY | Freq: Two times a day (BID) | RESPIRATORY_TRACT | 0 refills | Status: AC

## 2016-10-24 NOTE — ED Provider Notes (Signed)
Vitals:   10/23/16 1955 10/24/16 0639  BP: 106/68 112/77  Pulse: (!) 109 72  Resp:  20  Temp:    SpO2:     No acute events reported overnight to me by nursing staff.  Patient speaks gruffly but is cooperative and agreeable that we are working to manage social disposition.     Governor Rooks, MD 10/24/16 [phone]

## 2016-10-24 NOTE — ED Notes (Signed)
Refused VS

## 2016-10-24 NOTE — ED Notes (Signed)
Report was received from Jodelle Green., RN; Pt. Verbalizes no complaints; stating, "I'm ready to go."; denies S.I./Hi. Continue to monitor with 15 min. Monitoring.
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
## 2016-10-24 NOTE — ED Notes (Signed)
Strong urine odor in room.

## 2016-10-24 NOTE — ED Notes (Signed)
Patient received PM snack. 

## 2016-10-24 NOTE — Progress Notes (Signed)
J[facility] from [facility] came to assess patient today. Per Kathie Rhodes she can take patient tomorrow once she confirms with patient's guardian Corwin Levins. Per Kathie Rhodes she has a house in [place] with 6 males and that patient can come live at. Per Kathie Rhodes she will have to have 30 day prescriptions for all patient's medication so she has time to get him established. Per Kathie Rhodes she does not requires a FL2 or PASARR. Per Kathie Rhodes her staff member Tammy Sours will come pick patient up in the morning. CSW attempted to contact patient's guardian Corwin Levins through [employer] [phone] however he did not answer and a voicemail was left. CSW also paged Dr. Toni Amend to request prescriptions. Per Dr. Toni Amend he will write prescriptions for patient. CSW also contacted [facility] and spoke to [place] and made her aware of above. Per Elease Hashimoto they will not follow patient in [place] however they will refer them to their [place] office to get a new ACTT team.    [employer], LCSW [phone]

## 2016-10-24 NOTE — Progress Notes (Signed)
Clinical Child psychotherapist (CSW) contacted Jinny Sanders from [facility] who stated that she is coming to assess patient between 51 and 12 noon today to see if he would be appropriate for her program. CSW made RN aware of above.   [employer], LCSW [phone]

## 2016-10-24 NOTE — ED Notes (Signed)
ED BHU PLACEMENT JUSTIFICATION Is the patient under IVC or is there intent for IVC: No. Is the patient medically cleared: Yes.   Is there vacancy in the ED BHU: Yes.   Is the population mix appropriate for patient: Yes.   Is the patient awaiting placement in inpatient or outpatient setting: Yes.   Has the patient had a psychiatric consult: Yes.   Survey of unit performed for contraband, proper placement and condition of furniture, tampering with fixtures in bathroom, shower, and each patient room: Yes.   APPEARANCE/BEHAVIOR calm, cooperative and adequate rapport can be established NEURO ASSESSMENT Orientation: place and person Hallucinations: No.None noted (Hallucinations) Speech: Normal Gait: normal RESPIRATORY ASSESSMENT Normal expansion.  Clear to auscultation.  No rales, rhonchi, or wheezing. CARDIOVASCULAR ASSESSMENT regular rate and rhythm, S1, S2 normal, no murmur, click, rub or gallop GASTROINTESTINAL ASSESSMENT soft, nontender, BS WNL, no r/g EXTREMITIES normal strength, tone, and muscle mass PLAN OF CARE Provide calm/safe environment. Vital signs assessed twice daily. ED BHU Assessment once each 12-hour shift. Collaborate with intake RN daily or as condition indicates. Assure the ED provider has rounded once each shift. Provide and encourage hygiene. Provide redirection as needed. Assess for escalating behavior; address immediately and inform ED provider.  Assess family dynamic and appropriateness for visitation as needed: Yes.   Educate the patient/family about BHU procedures/visitation: Yes.   

## 2016-10-25 NOTE — ED Notes (Signed)
Pt was discharged in care of staff from group home, who signed for discharge. All belongings were returned for pt, who indicated he had received all belongings. Pt was excited about discharge. He received scripts for a 30-day med supply from Dr. Toni Amend. These were reviewed with pt and with caregivers. AVS given/reviewed as well. Pt was ambulatory and in no acute upon exit.

## 2016-10-25 NOTE — ED Notes (Signed)
Pt showered

## 2016-10-25 NOTE — ED Provider Notes (Signed)
No acute events reported overnight. Pending psych disposition   Jene Every, MD 10/25/16 [phone]

## 2016-10-25 NOTE — Progress Notes (Signed)
Clinical Child psychotherapist (CSW) contacted patient's guardian Corwin Levins through [employer]. Guardian approved for patient to D/C today with Jinny Sanders to the transitional independent housing program in [place] [phone] [address] [place], [place] [address]). CSW made guardian aware that patient will need to be established with a new ACTT team in [place] and have psych follow up care and blood draws. Per Ron he will coordinator with Jinny Sanders and the ACTT team to make those arrangements. CSW contacted Jinny Sanders and made her aware of above. Per Kathie Rhodes she will come pick patient up today between 12 and 1 pm. CSW made [facility] aware of patient's medicaid # [id number] Q. RN aware of above. Please reconsult if future social work needs arise. CSW signing off.     [employer], LCSW [phone]

## 2016-10-25 NOTE — ED Notes (Addendum)
Pt has been resting in his room for most of the morning. He has been pleasant and cooperative. Will continue to monitor for needs/safety until his ride shows.

## 2016-10-25 NOTE — ED Notes (Signed)
Pt has been resting off-and-on since shift began. He's been calm, cooperative, and polite. He voices no needs when encouraged. Pt remains safe with checks, rounds, and camera monitoring.

## 2016-10-26 ENCOUNTER — Encounter (HOSPITAL_COMMUNITY): Payer: Self-pay | Admitting: *Deleted

## 2016-10-26 ENCOUNTER — Emergency Department (HOSPITAL_COMMUNITY)
Admission: EM | Admit: 2016-10-26 | Discharge: 2016-10-27 | Disposition: A | Discharge status: Home / Self Care | Payer: 59 | Attending: Emergency Medicine | Admitting: Emergency Medicine

## 2016-10-26 DIAGNOSIS — F2089 Other schizophrenia: Secondary | ICD-10-CM | POA: Diagnosis not present

## 2016-10-26 DIAGNOSIS — F17210 Nicotine dependence, cigarettes, uncomplicated: Secondary | ICD-10-CM | POA: Insufficient documentation

## 2016-10-26 DIAGNOSIS — I10 Essential (primary) hypertension: Secondary | ICD-10-CM | POA: Diagnosis not present

## 2016-10-26 DIAGNOSIS — F22 Delusional disorders: Secondary | ICD-10-CM | POA: Diagnosis not present

## 2016-10-26 DIAGNOSIS — Z046 Encounter for general psychiatric examination, requested by authority: Secondary | ICD-10-CM | POA: Diagnosis not present

## 2016-10-26 DIAGNOSIS — R413 Other amnesia: Secondary | ICD-10-CM | POA: Diagnosis present

## 2016-10-26 LAB — CBC
HEMATOCRIT: 40.2 % (ref 39.0–52.0)
Hemoglobin: 13.7 g/dL (ref 13.0–17.0)
MCH: 30.3 pg (ref 26.0–34.0)
MCHC: 34.1 g/dL (ref 30.0–36.0)
MCV: 88.9 fL (ref 78.0–100.0)
Platelets: 168 10*3/uL (ref 150–400)
RBC: 4.52 MIL/uL (ref 4.22–5.81)
RDW: 13.8 % (ref 11.5–15.5)
WBC: 6.5 10*3/uL (ref 4.0–10.5)

## 2016-10-26 LAB — RAPID URINE DRUG SCREEN, HOSP PERFORMED
Amphetamines: NOT DETECTED
Barbiturates: NOT DETECTED
Benzodiazepines: POSITIVE — AB
COCAINE: NOT DETECTED
OPIATES: NOT DETECTED
Tetrahydrocannabinol: NOT DETECTED

## 2016-10-26 LAB — COMPREHENSIVE METABOLIC PANEL
ALBUMIN: 3.8 g/dL (ref 3.5–5.0)
ALK PHOS: 53 U/L (ref 38–126)
ALT: 12 U/L — AB (ref 17–63)
AST: 16 U/L (ref 15–41)
Anion gap: 7 (ref 5–15)
BUN: 14 mg/dL (ref 6–20)
CHLORIDE: 103 mmol/L (ref 101–111)
CO2: 26 mmol/L (ref 22–32)
Calcium: 9.5 mg/dL (ref 8.9–10.3)
Creatinine, Ser: 0.78 mg/dL (ref 0.61–1.24)
GFR calc Af Amer: >60 mL/min (ref >60)
GFR calc non Af Amer: >60 mL/min (ref >60)
Glucose, Bld: 102 mg/dL — ABNORMAL HIGH (ref 65–99)
POTASSIUM: 5.1 mmol/L (ref 3.5–5.1)
SODIUM: 136 mmol/L (ref 135–145)
TOTAL PROTEIN: 6.6 g/dL (ref 6.5–8.1)
Total Bilirubin: 0.2 mg/dL — ABNORMAL LOW (ref 0.3–1.2)

## 2016-10-26 LAB — ETHANOL: Alcohol, Ethyl (B): <10 mg/dL (ref <10)

## 2016-10-26 NOTE — ED Triage Notes (Signed)
The pt reports that he just moved to [place] in a halfway hoouse and today he was taking a walk and forgot where he was going and how to get back to the halfway house.  He knew he was in [place] stopped so\omeone for a ride and asked them to bring him here.  Alert oriented no pain anywhere.  He reports that his mother just died this past year

## 2016-10-26 NOTE — ED Notes (Signed)
Patient in lobby, moved to room

## 2016-10-26 NOTE — ED Notes (Signed)
Patient called to room, no answer x2.  

## 2016-10-26 NOTE — ED Notes (Signed)
Pt discussed with dr Corlis Leak  She ordered labs

## 2016-10-26 NOTE — ED Notes (Signed)
Pt not answering in waiting room, RN calling pt outside.

## 2016-10-26 NOTE — ED Notes (Signed)
Pt not in room.

## 2016-10-26 NOTE — ED Provider Notes (Signed)
[facility]-EMERGENCY DEPT Provider Note   CSN: [id number] Arrival date & time: 10/26/16  1529     History   Chief Complaint Chief Complaint  Patient presents with  . Memory Loss    HPI TOBEN ACUNA is a 56 y.o. male.  HPI   Patient is a 56 year old male with past medical history significant for hypertension and schizophrenia presenting today with "being lost". He is unable to tell me where he is from, where he stayed, or where he was planning on going.when asked the year is able to say 11/04/2016. However this was followed by a 10 minute monologue about the year 2020 and how his mom doesn't think it will exist, his thoughts on whether the year 2020 can exist because of holes, black holes,, and other concepts.  In reviewing patient's chart it appears that he was discharged from behavioral health for the last 24-48 hours. However I can't see where he was discharged to. And he has no idea.  I would like to TS evaluate patient pbecause I'm unsure whether he is safe to make decisions for himself or to be let go into the community by himself.  Past Medical History:  Diagnosis Date  . Hypertension   . Schizophrenia [facility])     Patient Active Problem List   Diagnosis Date Noted  . Dyslipidemia 05/27/2016  . Constipation 05/27/2016  . Schizophrenia, undifferentiated (HCC) 05/27/2016  . Noncompliance 02/15/2016  . Tobacco use disorder 06/30/2014  . Hypertension 06/29/2014    Past Surgical History:  Procedure Laterality Date  . 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 Authorizing Provider  chlorproMAZINE (THORAZINE) 100 MG tablet Take 1 tablet (100 mg total) by mouth 4 (four) times daily as needed (Agitation). 10/24/16   Clapacs, [name], MD  cloZAPine (CLOZARIL) 100 MG tablet Take 1 tablet (100 mg total) by mouth daily. 10/25/16   Clapacs, [name], MD    cloZAPine (CLOZARIL) 200 MG tablet Take 2 tablets (400 mg total) by mouth at bedtime. 10/24/16   Clapacs, [name], MD  desmopressin (DDAVP) 0.2 MG tablet Take 1 tablet (0.2 mg total) by mouth at bedtime. 10/24/16   Clapacs, [name], MD  divalproex (DEPAKOTE) 500 MG DR tablet Take 2 tablets (1,000 mg total) by mouth every 12 (twelve) hours. 10/24/16   Clapacs, [name], MD  docusate sodium (COLACE) 100 MG capsule Take 2 capsules (200 mg total) by mouth 2 (two) times daily. 10/24/16   Clapacs, [name], MD  FLUoxetine (PROZAC) 10 MG capsule Take 1 capsule (10 mg total) by mouth daily. 10/25/16   Clapacs, [name], MD  mometasone-formoterol (DULERA) 200-5 MCG/ACT AERO Inhale 2 puffs into the lungs 2 (two) times daily. 10/24/16   Clapacs, [name], MD  senna (SENOKOT) 8.6 MG TABS tablet Take 2 tablets (17.2 mg total) by mouth at bedtime. 10/24/16   Clapacs, [name], MD  simvastatin (ZOCOR) 40 MG tablet Take 1 tablet (40 mg total) by mouth daily at 6 PM. 10/24/16   Clapacs, [name], MD  temazepam (RESTORIL) 7.5 MG capsule Take 1 capsule (7.5 mg total) by mouth at bedtime. 10/24/16   Clapacs, [name], MD    Family History No family history on file.  Social History Social History  Substance Use Topics  . Smoking status: Current Every Day  Smoker    Packs/day: 1.00    Types: Cigarettes  . Smokeless tobacco: Current User  . Alcohol use No     Allergies   Patient has no known allergies.   Review of Systems Review of Systems  Constitutional: Negative for activity change.  Respiratory: Negative for shortness of breath.   Cardiovascular: Negative for chest pain.  Gastrointestinal: Negative for abdominal pain.     Physical Exam Updated Vital Signs BP 96/68 (BP Location: Right Arm)   Pulse (!) 103   Temp 99 F (37.2 C) (Oral)   Resp 16   Ht 6' (1.829 m)   Wt 90.7 kg (200 lb)   SpO2 99%   BMI 27.12 kg/m   Physical Exam  Constitutional: He appears well-nourished.  HENT:  Head: Normocephalic.  Eyes:  Conjunctivae are normal.  Cardiovascular: Normal rate and regular rhythm.   Pulmonary/Chest: Effort normal and breath sounds normal. No respiratory distress.  Neurological: No cranial nerve deficit.  Skin: Skin is warm and dry. He is not diaphoretic.  Psychiatric:  Odd association of ideas, rambling, delusions     ED Treatments / Results  Labs (all labs ordered are listed, but only abnormal results are displayed) Labs Reviewed  COMPREHENSIVE METABOLIC PANEL - Abnormal; Notable for the following:       Result Value   Glucose, Bld 102 (*)    ALT 12 (*)    Total Bilirubin 0.2 (*)    All other components within normal limits  CBC  ETHANOL  RAPID URINE DRUG SCREEN, HOSP PERFORMED    EKG  EKG Interpretation None       Radiology No results found.  Procedures Procedures (including critical care time)  Medications Ordered in ED Medications - No data to display   Initial Impression / Assessment and Plan / ED Course  I have reviewed the triage vital signs and the nursing notes.  Pertinent labs & imaging results that were available during my care of the patient were reviewed by me and considered in my medical decision making (see chart for details).     Patient is a 56 year old male with past medical history significant for hypertension and schizophrenia presenting today with "being lost". He is unable to tell me where he is from, where he stayed, or where he was planning on going.when asked the year is able to say 11/04/2016. However this was followed by a 10 minute monologue about the year 2020 and how his mom doesn't think it will exist, his thoughts on whether the year 2020 can exist because of holes, black holes,, and other concepts.  In reviewing patient's chart it appears that he was discharged from behavioral health for the last 24-48 hours. However I can't see where he was discharged to. And he has no idea.  I would like to TS evaluate patient pbecause I'm unsure  whether he is safe to make decisions for himself or to be let go into the community by himself.   Final Clinical Impressions(s) / ED Diagnoses   Final diagnoses:  None    New Prescriptions New Prescriptions   No medications on file     Abelino Derrick, MD 10/26/16 2051

## 2016-10-26 NOTE — ED Notes (Signed)
Called for 3rd time, no answer

## 2016-10-26 NOTE — ED Notes (Signed)
Pt called for room x1

## 2016-10-26 NOTE — ED Notes (Signed)
The pt has a lt hand amputation not recent

## 2016-10-27 DIAGNOSIS — R4587 Impulsiveness: Secondary | ICD-10-CM

## 2016-10-27 DIAGNOSIS — F17210 Nicotine dependence, cigarettes, uncomplicated: Secondary | ICD-10-CM

## 2016-10-27 DIAGNOSIS — F203 Undifferentiated schizophrenia: Secondary | ICD-10-CM

## 2016-10-27 DIAGNOSIS — R450 Nervousness: Secondary | ICD-10-CM

## 2016-10-27 DIAGNOSIS — Z593 Problems related to living in residential institution: Secondary | ICD-10-CM

## 2016-10-27 DIAGNOSIS — F419 Anxiety disorder, unspecified: Secondary | ICD-10-CM

## 2016-10-27 MED ORDER — SIMVASTATIN 40 MG PO TABS
40.0000 mg | ORAL_TABLET | Freq: Every day | ORAL | Status: DC
  Filled 2016-10-27: qty 1

## 2016-10-27 MED ORDER — CHLORPROMAZINE HCL 25 MG PO TABS
100.0000 mg | ORAL_TABLET | Freq: Four times a day (QID) | ORAL | Status: DC | PRN
  Administered 2016-10-27: 100 mg via ORAL
  Filled 2016-10-27: qty 4

## 2016-10-27 MED ORDER — CLOZAPINE 100 MG PO TABS: 100.0000 mg | ORAL_TABLET | Freq: Every day | ORAL | Status: DC

## 2016-10-27 MED ORDER — DESMOPRESSIN ACETATE 0.2 MG PO TABS
0.2000 mg | ORAL_TABLET | Freq: Every day | ORAL | Status: DC
  Administered 2016-10-27: 0.2 mg via ORAL
  Filled 2016-10-27: qty 1

## 2016-10-27 MED ORDER — MOMETASONE FURO-FORMOTEROL FUM 200-5 MCG/ACT IN AERO
2.0000 | INHALATION_SPRAY | Freq: Two times a day (BID) | RESPIRATORY_TRACT | Status: DC
  Administered 2016-10-27: 2 via RESPIRATORY_TRACT
  Filled 2016-10-27: qty 8.8

## 2016-10-27 MED ORDER — DIVALPROEX SODIUM 250 MG PO DR TAB
1000.0000 mg | DELAYED_RELEASE_TABLET | Freq: Two times a day (BID) | ORAL | Status: DC
  Administered 2016-10-27: 1000 mg via ORAL
  Filled 2016-10-27: qty 4

## 2016-10-27 MED ORDER — TEMAZEPAM 7.5 MG PO CAPS: 7.5000 mg | ORAL_CAPSULE | Freq: Every day | ORAL | Status: DC

## 2016-10-27 MED ORDER — CLOZAPINE 100 MG PO TABS
400.0000 mg | ORAL_TABLET | Freq: Every day | ORAL | Status: DC
  Administered 2016-10-27: 400 mg via ORAL
  Filled 2016-10-27: qty 4

## 2016-10-27 MED ORDER — SENNA 8.6 MG PO TABS
2.0000 | ORAL_TABLET | Freq: Every day | ORAL | Status: DC
Start: 2016-10-27 — End: 2016-10-27
  Administered 2016-10-27: 17.2 mg via ORAL
  Filled 2016-10-27: qty 2

## 2016-10-27 MED ORDER — FLUOXETINE HCL 10 MG PO CAPS
10.0000 mg | ORAL_CAPSULE | Freq: Every day | ORAL | Status: DC
  Administered 2016-10-27: 10 mg via ORAL
  Filled 2016-10-27: qty 1

## 2016-10-27 NOTE — ED Notes (Signed)
Social work called for transportation arrangements for pt's direct d/c to the group home

## 2016-10-27 NOTE — ED Provider Notes (Signed)
Patient is thought to be at baseline per TTS.  Patient wandered away from the group home.  He was recently placed into a new group home.  The recommendation from TTS at this time is observation overnight and social work consult in the morning to discuss case further with the group home to get him back to the group home tomorrow.   Azalia Bilis, MD 10/27/16 [phone]

## 2016-10-27 NOTE — ED Notes (Signed)
Jeffrey [name], [facility], advised Inetta Fermo, NP, [facility] - recommending d/c. Tobi Bastos, RN, and Dr Erma Heritage aware.

## 2016-10-27 NOTE — ED Notes (Signed)
Touched base with social work again, per SW the group home wants to have pt's medications picked up prior to the pt being discharged.

## 2016-10-27 NOTE — ED Provider Notes (Signed)
Pt cleared by TTS. SW to arrange ride/return to group home. Pt remains HDS, pleasant and in NAD this AM. No ongoing SI, HI, AVH.   Shaune Pollack, MD 10/27/16 1051

## 2016-10-27 NOTE — Progress Notes (Signed)
Recommendation for patient is for discharge back to group home, per Ferne Reus NP.  [facility] RN Ledon Snare has been informed.  Melbourne Abts, MSW, LCSWA Clinical social worker in disposition [facility], TTS Office

## 2016-10-27 NOTE — ED Notes (Signed)
Belongings removed and pt placed in paper scrubs. Belongings placed in cabinet 12.
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
## 2016-10-27 NOTE — Progress Notes (Signed)
CSW received notification from RN Tobi Bastos in ED regarding patient's discharge plan. CSW contacted patient's legal guardian, Jeffrey [name] at [phone] to inform him of patient's whereabouts. Legal guardian stated that he is in [place], [place] and that CSW would need to contact Jeffrey [name] (Transitional Living facility contact) to arrange transportation back.   CSW contacted Jeffrey [name] to discuss patient, Jeffrey [name] stated that facility was willing to accept patient back but a huge barrier for accessing medication was in place. Jeffrey [name] from facility attempted to obtain medication and was given the price of $2,000. Medications were not obtained from pharmacy at that time. CSW offered assistance to provide pharmacy with correct insurance information, Ms. Davis in agreement.  CSW contacted [facility] on [address] to provide updated insurance information for patient. Pharmacist entered and accepted insurance ID numbers. Pharmacist stated there were two prescriptions that were over the counter and should cost the patient no more than "$6 or $7."  CSW contacted Jeffrey [name] to inform her of insurance covering the cost of medications and to discuss transportation for patient back to facility. Jeffrey [name] stated that she would coordinate transportation with her Medication Tech Lupita [name] to ensure that medications were in hand prior to retrieving patient from [facility]. Jeffrey [name] is expected to return call to CSW to confirm transportation plan.  CSW spoke with Jeffrey [name], who stated she would be at [facility] at 2:30pm on 10/27/16 for patient pick up. CSW called RN Tobi Bastos in ED for report.  CSW signing off.  Edwin Dada, MSW, LCSW-A Weekend Clinical Social Worker [phone]

## 2016-10-31 ENCOUNTER — Encounter (HOSPITAL_COMMUNITY): Payer: Self-pay | Admitting: Licensed Clinical Social Worker

## 2016-10-31 ENCOUNTER — Emergency Department (HOSPITAL_COMMUNITY)
Admission: EM | Admit: 2016-10-31 | Discharge: 2016-11-06 | Disposition: A | Discharge status: Home / Self Care | Payer: Medicare Other | Attending: Emergency Medicine | Admitting: Emergency Medicine

## 2016-10-31 DIAGNOSIS — F203 Undifferentiated schizophrenia: Secondary | ICD-10-CM | POA: Diagnosis present

## 2016-10-31 DIAGNOSIS — F17210 Nicotine dependence, cigarettes, uncomplicated: Secondary | ICD-10-CM | POA: Diagnosis not present

## 2016-10-31 DIAGNOSIS — F209 Schizophrenia, unspecified: Secondary | ICD-10-CM

## 2016-10-31 DIAGNOSIS — I10 Essential (primary) hypertension: Secondary | ICD-10-CM | POA: Diagnosis not present

## 2016-10-31 DIAGNOSIS — R451 Restlessness and agitation: Secondary | ICD-10-CM | POA: Diagnosis not present

## 2016-10-31 DIAGNOSIS — F419 Anxiety disorder, unspecified: Secondary | ICD-10-CM | POA: Diagnosis not present

## 2016-10-31 DIAGNOSIS — F259 Schizoaffective disorder, unspecified: Secondary | ICD-10-CM | POA: Insufficient documentation

## 2016-10-31 LAB — CBC WITH DIFFERENTIAL/PLATELET
Basophils Absolute: 0 10*3/uL (ref 0.0–0.1)
Basophils Relative: 0 %
EOS PCT: 0 %
Eosinophils Absolute: 0 10*3/uL (ref 0.0–0.7)
HCT: 36 % — ABNORMAL LOW (ref 39.0–52.0)
HEMOGLOBIN: 12.4 g/dL — AB (ref 13.0–17.0)
LYMPHS ABS: 1.5 10*3/uL (ref 0.7–4.0)
Lymphocytes Relative: 26 %
MCH: 30.7 pg (ref 26.0–34.0)
MCHC: 34.4 g/dL (ref 30.0–36.0)
MCV: 89.1 fL (ref 78.0–100.0)
MONOS PCT: 12 %
Monocytes Absolute: 0.7 10*3/uL (ref 0.1–1.0)
Neutro Abs: 3.6 10*3/uL (ref 1.7–7.7)
Neutrophils Relative %: 62 %
PLATELETS: 168 10*3/uL (ref 150–400)
RBC: 4.04 MIL/uL — AB (ref 4.22–5.81)
RDW: 14.1 % (ref 11.5–15.5)
WBC: 5.9 10*3/uL (ref 4.0–10.5)

## 2016-10-31 LAB — COMPREHENSIVE METABOLIC PANEL
ALBUMIN: 3.9 g/dL (ref 3.5–5.0)
ALK PHOS: 53 U/L (ref 38–126)
ALT: 15 U/L — ABNORMAL LOW (ref 17–63)
ANION GAP: 9 (ref 5–15)
AST: 19 U/L (ref 15–41)
BUN: 12 mg/dL (ref 6–20)
CALCIUM: 8.7 mg/dL — AB (ref 8.9–10.3)
CHLORIDE: 102 mmol/L (ref 101–111)
CO2: 23 mmol/L (ref 22–32)
Creatinine, Ser: 0.63 mg/dL (ref 0.61–1.24)
GFR calc non Af Amer: >60 mL/min (ref >60)
Glucose, Bld: 127 mg/dL — ABNORMAL HIGH (ref 65–99)
POTASSIUM: 3.6 mmol/L (ref 3.5–5.1)
SODIUM: 134 mmol/L — AB (ref 135–145)
Total Bilirubin: 0.7 mg/dL (ref 0.3–1.2)
Total Protein: 6.5 g/dL (ref 6.5–8.1)

## 2016-10-31 LAB — RAPID URINE DRUG SCREEN, HOSP PERFORMED
Amphetamines: NOT DETECTED
BENZODIAZEPINES: NOT DETECTED
Barbiturates: NOT DETECTED
Cocaine: NOT DETECTED
OPIATES: NOT DETECTED
Tetrahydrocannabinol: NOT DETECTED

## 2016-10-31 LAB — SALICYLATE LEVEL

## 2016-10-31 LAB — ETHANOL: Alcohol, Ethyl (B): <10 mg/dL (ref <10)

## 2016-10-31 LAB — ACETAMINOPHEN LEVEL

## 2016-10-31 NOTE — ED Notes (Signed)
Patient admitted to the unit without incident.  Patient currently denies SI, HI and AVH.  Patient remains irritable and isolative.

## 2016-10-31 NOTE — ED Notes (Signed)
Pt A&O x 3, no distress noted, calm & cooperative.  Monitoring for safety, Q 15 min checks in effect. 

## 2016-10-31 NOTE — Progress Notes (Signed)
CSW reviewed pt chart. Per Jeffrey Guadeloupe, NP, pt meets criteria for inpatient hospitalization.  Pt referral packet sent to the following hospitals: [facility], [facility], [facility], [facility], [place], [place], [place]  Disposition:  CSW will continue to follow for placement.  Jeffrey [name]. Kaylyn Lim, MSW, LCSWA Disposition Clinical Social Work [phone] (cell) [phone] (office)

## 2016-10-31 NOTE — ED Notes (Signed)
ACT TEAM Aldine Contes [phone]  LEGAL ASSIGNED [place]ARDIAN- [phone]

## 2016-10-31 NOTE — ED Provider Notes (Signed)
[facility]-EMERGENCY DEPT Provider Note   CSN: [id number] Arrival date & time: 10/31/16  1237     History   Chief Complaint Chief Complaint  Patient presents with  . Medical Clearance    HPI Jeffrey [name] is a 56 y.o. male presenting with ACT team member for concerns about psychosis.   Level V caveat for psychiatric condition  Per nursing note, ACT team member reports patient was responding to internal stimuli, was actively psychotic, and ran away from group home.  Patient states that he left the group home last night because he wanted to get some cigarettes. He bought 2 packs, the first was good, but then second one was moldy and needed to be thrown out. He states he was out all all night roaming the streets, and he was cold. He reports that this morning he was seeing blue and purple lights. This happens intermittently. He denies currently seeing this. He states that his ACT team member was concerned, and brought him to the hospital. He states he is taking his medications as prescribed. He likes his group home, and was not trying to run away. He denies suicidal thoughts, homicidal thoughts, or current auditory or visual hallucinations. He denies other medical history. He denies alcohol or drug use. He reports a history of schizophrenia for which he has had to be hospitalized previously.   Discussed with ACT team member on phone. They are concerned as pt has been leaving the group home frequently. They are unsure if he is getting is medication, espcially Clozaril. Pt has had worsening paranoid thoughts, and he has a h/o violent behavior, especially as he becomes more disorganized.   HPI  Past Medical History:  Diagnosis Date  . Hypertension   . Schizophrenia [facility])     Patient Active Problem List   Diagnosis Date Noted  . Dyslipidemia 05/27/2016  . Constipation 05/27/2016  . Schizophrenia, undifferentiated (HCC) 05/27/2016  . Noncompliance 02/15/2016  . Tobacco use disorder  06/30/2014  . Hypertension 06/29/2014    Past Surgical History:  Procedure Laterality Date  . 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 Authorizing Provider  chlorproMAZINE (THORAZINE) 100 MG tablet Take 1 tablet (100 mg total) by mouth 4 (four) times daily as needed (Agitation). Patient not taking: Reported on 10/31/2016 10/24/16   Clapacs, [name], MD  cloZAPine (CLOZARIL) 100 MG tablet Take 1 tablet (100 mg total) by mouth daily. 10/25/16   Clapacs, [name], MD  cloZAPine (CLOZARIL) 200 MG tablet Take 2 tablets (400 mg total) by mouth at bedtime. 10/24/16   Clapacs, [name], MD  desmopressin (DDAVP) 0.2 MG tablet Take 1 tablet (0.2 mg total) by mouth at bedtime. 10/24/16   Clapacs, [name], MD  divalproex (DEPAKOTE) 500 MG DR tablet Take 2 tablets (1,000 mg total) by mouth every 12 (twelve) hours. 10/24/16   Clapacs, [name], MD  docusate sodium (COLACE) 100 MG capsule Take 2 capsules (200 mg total) by mouth 2 (two) times daily. 10/24/16   Clapacs, [name], MD  FLUoxetine (PROZAC) 10 MG capsule Take 1 capsule (10 mg total) by mouth daily. 10/25/16   Clapacs, [name], MD  mometasone-formoterol (DULERA) 200-5 MCG/ACT AERO Inhale 2 puffs into the lungs 2 (two) times daily. 10/24/16   Clapacs, [name], MD  senna (SENOKOT) 8.6 MG  TABS tablet Take 2 tablets (17.2 mg total) by mouth at bedtime. 10/24/16   Clapacs, [name], MD  simvastatin (ZOCOR) 40 MG tablet Take 1 tablet (40 mg total) by mouth daily at 6 PM. 10/24/16   Clapacs, [name], MD  temazepam (RESTORIL) 7.5 MG capsule Take 1 capsule (7.5 mg total) by mouth at bedtime. 10/24/16   Clapacs, [name], MD    Family History No family history on file.  Social History Social History  Substance Use Topics  . Smoking status: Current Every Day Smoker    Packs/day: 1.00    Types: Cigarettes  . Smokeless tobacco: Current User  .  Alcohol use No     Allergies   Patient has no known allergies.   Review of Systems Review of Systems  Constitutional: Negative for chills and fever.  HENT: Negative for congestion and sore throat.   Eyes: Negative for photophobia and visual disturbance.  Respiratory: Negative for cough and shortness of breath.   Cardiovascular: Negative for chest pain.  Gastrointestinal: Negative for abdominal pain, nausea and vomiting.  Genitourinary: Negative for dysuria and hematuria.  Skin: Negative for wound.  Neurological: Negative for dizziness and light-headedness.  Psychiatric/Behavioral: Positive for hallucinations (resolved). Negative for dysphoric mood, self-injury and suicidal ideas.     Physical Exam Updated Vital Signs BP 114/73 (BP Location: Right Arm)   Pulse (!) 101   Temp 98 F (36.7 C) (Oral)   Resp 16   Ht 6' (1.829 m)   Wt 90.7 kg (200 lb)   SpO2 96%   BMI 27.12 kg/m   Physical Exam  Constitutional: He is oriented to person, place, and time. He appears well-developed and well-nourished. No distress.  HENT:  Head: Normocephalic and atraumatic.  Eyes: EOM are normal.  Neck: Normal range of motion.  Cardiovascular: Normal rate, regular rhythm and intact distal pulses.   Pulmonary/Chest: Effort normal and breath sounds normal. No respiratory distress. He has no wheezes. He has no rales. He exhibits no tenderness.  Abdominal: Soft. He exhibits no distension and no mass. There is no tenderness. There is no rebound and no guarding.  Musculoskeletal: Normal range of motion.  R lower arm amputation.   Neurological: He is alert and oriented to person, place, and time.  Skin: Skin is warm. No rash noted.  Psychiatric: His speech is tangential. He is not agitated, not aggressive, not hyperactive and not combative. He expresses no homicidal and no suicidal ideation. He expresses no suicidal plans and no homicidal plans.  Not responding to internal stimuli during exam.  Thoughts occasionally tangential, as when he was discussing the moldy cigarettes. Patient does not always respond to the question that was asked. Behavior was off.   Nursing note and vitals reviewed.    ED Treatments / Results  Labs (all labs ordered are listed, but only abnormal results are displayed) Labs Reviewed  COMPREHENSIVE METABOLIC PANEL - Abnormal; Notable for the following:       Result Value   Sodium 134 (*)    Glucose, Bld 127 (*)    Calcium 8.7 (*)    ALT 15 (*)    All other components within normal limits  ACETAMINOPHEN LEVEL - Abnormal; Notable for the following:    Acetaminophen (Tylenol), Serum <10 (*)    All other components within normal limits  CBC WITH DIFFERENTIAL/PLATELET - Abnormal; Notable for the following:    RBC 4.04 (*)    Hemoglobin 12.4 (*)    HCT 36.0 (*)  All other components within normal limits  ETHANOL  SALICYLATE LEVEL  RAPID URINE DRUG SCREEN, HOSP PERFORMED    EKG  EKG Interpretation None       Radiology No results found.  Procedures Procedures (including critical care time)  Medications Ordered in ED Medications - No data to display   Initial Impression / Assessment and Plan / ED Course  I have reviewed the triage vital signs and the nursing notes.  Pertinent labs & imaging results that were available during my care of the patient were reviewed by me and considered in my medical decision making (see chart for details).     Patient presents to the emergency room due to ACT team members concern for recent behavior. ACT team concerned about his paranoid thoughts and possible medication noncompliance. Patient was pleasant with several instances of tangential thoughts. No obvious response to internal stimuli or obvious paranoid thoughts. Behavior was off. Will order labs for medical clearance. During exam, patient believes that he had already had labs drawn, but this had not been done yet.  Labs are reassuring and stable.  UDS negative. No neutropenia. At this time, patient is medically cleared for TTS and [facility] evaluation.  [facility] assessed, and pt to be admitted for inpatient tx.    Final Clinical Impressions(s) / ED Diagnoses   Final diagnoses:  Schizophrenia, unspecified type [facility])    New Prescriptions New Prescriptions   No medications on file     Alveria Apley, Cordelia Poche 10/31/16 1610    Tegeler, Canary Brim, MD 11/01/16 1346

## 2016-10-31 NOTE — BH Assessment (Addendum)
Assessment Note  Jeffrey [name] is an 56 y.o. male with a history of Schizophrenia. He was brought to [facility] by Rolland Bimler, ACT team member, and  phone number to be reached is #[phone]. Per patient history (noted by Dr. Toni Amend 10/24/2016 at 1809), patient was not taken back to his former group home and was placed in a new group home. Per Alysha, pt responding to internal stimuli, actively psychotic, and running away from newly placed transitional home. He was recently discharged on 10/25/2016 from a extended Inpatient stay at [facility]. Pt  verbalizes "roaming streets last night and it was cold." Per patient history, patient is logical/lucid for a time and then, symptomatic of Schizophrenia with disorganized speech, thought and behavior. During this assessment, patient's behavior was labile and bizarre. Patient was asked questions multiple times before answering appropriately. At other times he would speak on random thoughts that were difficult for this writer to understand. Patient also began speaking of Lowanda Foster trying to get in his house and having guns. He would then speak of shaving his pubic hairs with a knife. Patient denies SI, HI< and AVH's but sts he ignores AH if he hears.   Patient lives in a group home but it was a new placement after he was discharged on 10/24/2016. Patient has a guardianship through [employer] and his contact is Corwin Levins. Per history, patient was living with mother until she died last year. Per history, patient did have an ACTT team in [place] and there was discussion before his discharge by Social Work with his guardian on the need for patient to initiate services with ACTT in [facility] as he is living there now. Patient self reports receiving ACTT services with [facility].   Patient is dressed in scrubs. He is malodorous. Patient is alert to time, person, place, and situation. His eye contact is appropriate. Patient's psychomotor behavior is appropriate.  Patient's thought process was at times somewhat cohereant and relevant and at other times he had flight of ideas or tangential thoughts. Patient's judgment is impaired.    Diagnosis: Schizophrenia by history   Past Medical History:  Past Medical History:  Diagnosis Date  . Hypertension   . Schizophrenia [facility])     Past Surgical History:  Procedure Laterality Date  . 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  Social History:  reports that he has been smoking Cigarettes.  He has been smoking about 1.00 pack per day. He uses smokeless tobacco. He reports that he does not drink alcohol or use drugs.  Additional Social History:  Alcohol / Drug Use Pain Medications: Unable to assess (UTA) Prescriptions: SEE MAR Over the Counter: Unable to assess (UTA) History of alcohol / drug use?: Yes Longest period of sobriety (when/how long): Unable to assess (UTA) Substance #1 Name of Substance 1: NICOTINE/CIGARETTES 1 - Age of First Use: UNKNOWN 1 - Amount (size/oz): 1 - 1 1/2 PACKS 1 - Frequency: DAILY 1 - Duration: ONGOING 1 - Last Use / Amount: 10/26/16  CIWA: CIWA-Ar BP: (!) 142/92 Pulse Rate: 83 COWS:    Allergies: No Known Allergies  Home Medications:  (Not in a hospital admission)  OB/GYN Status:  No LMP for male patient.  General Assessment Data Location of Assessment: [facility] ED TTS Assessment: In system Is this a Tele or Face-to-Face Assessment?: Face-to-Face Is this an Initial Assessment or a Re-assessment for this encounter?:  Initial Assessment Marital status: Single Maiden name:  (N/A) Is patient pregnant?: No Pregnancy Status: No Living Arrangements: Group Home Can pt return to current living arrangement?:  (unknown ) Admission Status: Voluntary Is patient capable of signing voluntary admission?: No Referral Source: Self/Family/Friend Insurance type:  (Medicaid )  Medical Screening Exam [facility]  Walk-in ONLY) Medical Exam completed: No  Crisis Care Plan Living Arrangements: Group Home Legal Guardian: Other: ([employer] Rily [phone] ext [phone]) Name of Psychiatrist:  (patient reports [facility]) Name of Therapist:  (patient reports [facility])  Education Status Is patient currently in school?: No Current Grade:  (n/a) Highest grade of school patient has completed:  (UTA) Name of school:  (n/a) Contact person:  (n/a)  Risk to self with the past 6 months Suicidal Ideation: No Has patient been a risk to self within the past 6 months prior to admission? : No Suicidal Intent: No Has patient had any suicidal intent within the past 6 months prior to admission? : No Is patient at risk for suicide?: No Suicidal Plan?: No Has patient had any suicidal plan within the past 6 months prior to admission? : No Access to Means: No What has been your use of drugs/alcohol within the last 12 months?:  (unknown ) Previous Attempts/Gestures:  (UTA) How many times?:  (UTA) Triggers for Past Attempts: Unknown Intentional Self Injurious Behavior: None Family Suicide History: Unknown Recent stressful life event(s): Other (Comment) (death of mother and caretaker last year) Persecutory voices/beliefs?:  (UTA) Depression:  (UTA) Depression Symptoms:  (denies all symptoms ) Substance abuse history and/or treatment for substance abuse?: No Suicide prevention information given to non-admitted patients: Not applicable  Risk to Others within the past 6 months Homicidal Ideation: No Does patient have any lifetime risk of violence toward others beyond the six months prior to admission? : Yes (comment) Thoughts of Harm to Others: No Current Homicidal Intent: No Current Homicidal Plan: No Access to Homicidal Means: No Identified Victim:  (none reported) History of harm to others?: Yes Assessment of Violence: In past 6-12 months Violent Behavior Description:  (hx of aggression toward others at the  group home ) Does patient have access to weapons?: No Criminal Charges Pending?:  (UTA) Does patient have a court date:  (UTA) Is patient on probation?:  (UTA)  Psychosis Hallucinations:  (UTA) Delusions: Unspecified  Mental Status Report Appearance/Hygiene: Disheveled Eye Contact: Good Motor Activity: Freedom of movement Speech: Tangential Level of Consciousness: Alert Mood: Pleasant, Euthymic Affect: Appropriate to circumstance Anxiety Level: Minimal Thought Processes: Tangential Judgement: Impaired Orientation: Person, Place, Situation, Time Obsessive Compulsive Thoughts/Behaviors: Unable to Assess  Cognitive Functioning Concentration: Unable to Assess Memory: Unable to Assess IQ: Average Insight: see judgement above Impulse Control: Fair Appetite: Good Weight Loss:  (none reported) Weight Gain:  (none reported) Sleep: No Change Total Hours of Sleep:  (varies ) Vegetative Symptoms: Unable to Assess  ADLScreening [facility] Assessment Services) Patient's cognitive ability adequate to safely complete daily activities?: Yes Patient able to express need for assistance with ADLs?: Yes Independently performs ADLs?: Yes (appropriate for developmental age)  Prior Inpatient Therapy Prior Inpatient Therapy: Yes Prior Therapy Dates:  (multiple ) Prior Therapy Facilty/Provider(s):  (various ) Reason for Treatment:  (Schizophrenia )  Prior Outpatient Therapy Prior Outpatient Therapy:  (patient sts that he has a ACT team ...[facility]) Prior Therapy Dates:  (current ) Prior Therapy Facilty/Provider(s):  ([facility] per patient ) Reason for Treatment:  (ACTT services ) Does patient have an ACCT team?: Yes Does patient  have Intensive In-House Services?  : No Does patient have [facility] services? : No Does patient have P4CC services?: No  ADL Screening (condition at time of admission) Patient's cognitive ability adequate to safely complete daily activities?: Yes Is the patient deaf or have  difficulty hearing?: No Does the patient have difficulty seeing, even when wearing glasses/contacts?: No Does the patient have difficulty concentrating, remembering, or making decisions?: No Patient able to express need for assistance with ADLs?: Yes Does the patient have difficulty dressing or bathing?: No Independently performs ADLs?: Yes (appropriate for developmental age) Does the patient have difficulty walking or climbing stairs?: No Weakness of Legs: None Weakness of Arms/Hands: None  Home Assistive Devices/Equipment Home Assistive Devices/Equipment: None    Abuse/Neglect Assessment (Assessment to be complete while patient is alone) Physical Abuse: Denies Verbal Abuse: Denies Sexual Abuse: Denies Exploitation of patient/patient's resources: Denies Self-Neglect: Denies Values / Beliefs Cultural Requests During Hospitalization: None Spiritual Requests During Hospitalization: None   Advance Directives (For Healthcare) Does Patient Have a Medical Advance Directive?: No Would patient like information on creating a medical advance directive?: No - Patient declined Nutrition Screen- [facility] Adult/[facility] Patient's home diet: Regular  Additional Information 1:1 In Past 12 Months?: Yes CIRT Risk: Yes Elopement Risk: Yes Does patient have medical clearance?: Yes     Disposition:  Disposition Initial Assessment Completed for this Encounter: Yes Disposition of Patient: Inpatient treatment program (Per Elta Guadeloupe, NP, patient meets criteria for INPT [place]) Other disposition(s): Other (Comment) (Per Dr. Elta Guadeloupe, INPT treatment recommended )  On Site Evaluation by:   Reviewed with Physician:    Melynda Ripple 10/31/2016 3:53 PM

## 2016-10-31 NOTE — ED Triage Notes (Signed)
Rolland Bimler, ACT team member with pt during triage, phone number to reach [phone]. Per Alysha pt responding to internal stimuli, actively psychotic, and running away from newly placed transitional home. Pt denies SI/HI or A/VH but verbalizes "roaming streets last night and it was cold."

## 2016-11-01 DIAGNOSIS — F259 Schizoaffective disorder, unspecified: Secondary | ICD-10-CM | POA: Diagnosis not present

## 2016-11-01 MED ORDER — LORAZEPAM 2 MG/ML IJ SOLN: 2.0000 mg | Freq: Once | INTRAMUSCULAR | Status: AC

## 2016-11-01 MED ORDER — CLOZAPINE 100 MG PO TABS
100.0000 mg | ORAL_TABLET | Freq: Two times a day (BID) | ORAL | Status: DC
  Administered 2016-11-01 – 2016-11-06 (×11): 100 mg via ORAL
  Filled 2016-11-01 (×11): qty 1

## 2016-11-01 MED ORDER — DIVALPROEX SODIUM 500 MG PO DR TAB: 1000.0000 mg | DELAYED_RELEASE_TABLET | Freq: Two times a day (BID) | ORAL | Status: DC

## 2016-11-01 MED ORDER — DIVALPROEX SODIUM 500 MG PO DR TAB
500.0000 mg | DELAYED_RELEASE_TABLET | Freq: Two times a day (BID) | ORAL | Status: DC
  Administered 2016-11-01 – 2016-11-06 (×11): 500 mg via ORAL
  Filled 2016-11-01 (×7): qty 1
  Filled 2016-11-01: qty 2
  Filled 2016-11-01 (×3): qty 1

## 2016-11-01 MED ORDER — TRAZODONE HCL 50 MG PO TABS
50.0000 mg | ORAL_TABLET | Freq: Every evening | ORAL | Status: DC | PRN
  Administered 2016-11-02 – 2016-11-05 (×3): 50 mg via ORAL
  Filled 2016-11-01 (×3): qty 1

## 2016-11-01 MED ORDER — HYDROXYZINE HCL 25 MG PO TABS: 25.0000 mg | ORAL_TABLET | Freq: Three times a day (TID) | ORAL | Status: DC | PRN

## 2016-11-01 MED ORDER — LORAZEPAM 1 MG PO TABS
2.0000 mg | ORAL_TABLET | Freq: Once | ORAL | Status: AC
  Administered 2016-11-01: 2 mg via ORAL
  Filled 2016-11-01: qty 2

## 2016-11-01 NOTE — ED Notes (Signed)
Pt has become agitated throughout the day.  He walks the hallway and fusses and cusses at the people behind the glass.  He continues to respond to internal stimuli.  He does not wish to be called Jeffrey [name], he asked to be called  "Jeffrey [name]."  He has made multiple racial slurs.

## 2016-11-01 NOTE — Progress Notes (Signed)
11/01/16 1325:  LRT spoke to pt in the hallway.  Pt would not interact with LRT.   Caroll Rancher, LRT/CTRS

## 2016-11-01 NOTE — ED Notes (Signed)
Pt appears very confused.  He keeps stating over and over "I dont have schizophrenia"  He doesn't know why he is here.  He claims he just decided to walk here.  Pt is pacing hallway and appears to be responding to internal stimuli.  He denies hallucinations but says he hears "My own thoughts"  15 minute checks and video monitoring in place.
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
## 2016-11-01 NOTE — ED Notes (Signed)
Report to include Situation, Background, Assessment, and Recommendations received from Edie RN. Patient alert and oriented, warm and dry, in no acute distress. Patient denies SI, HI, AVH and pain. Patient made aware of Q15 minute rounds and security cameras for their safety. Patient instructed to come to me with needs or concerns. 

## 2016-11-01 NOTE — BHH Counselor (Signed)
Per Theo from [facility], Patient is declined for placement.

## 2016-11-01 NOTE — ED Notes (Signed)
Hourly rounding reveals patient in room. No complaints, stable, in no acute distress. Q15 minute rounds and monitoring via [employer] to continue. 

## 2016-11-01 NOTE — ED Notes (Signed)
Hourly rounding reveals patient sleeping in room. No complaints, stable, in no acute distress. Q15 minute rounds and monitoring via Security Cameras to continue. 

## 2016-11-01 NOTE — ED Notes (Addendum)
Pt continuously back and forth to bathroom.  Pt talking to self, pacing, laughing inappropriately.  Ringing emergency call bell, disturbing other milieu.  Pt banging on counter tops, yelling out loud.  PA Nira Conn  Called for PRN orders.

## 2016-11-01 NOTE — BH Assessment (Signed)
BHH Assessment Progress Note  Per Thedore Mins, MD, this pt requires psychiatric hospitalization at this time.  Pt presents under voluntary status, but please note that he has a guardian through [employer] [phone]).  His primary worker is Ron Renard Matter at ext. [phone].  I called the agency and was told that Mr Renard Matter will be in a training today.  However, their after hours phone number in [place] is [phone], and their fax number is [phone].  Please keep them apprised of pt's status, and be sure that they sign appropriate consents for the pt.  The following facilities have been contacted to seek placement for this pt, with results as noted:  Beds available, information sent, decision pending:  [facility]   Declined (reason unspecified):  [facility]   At capacity:  [facility] [place], [place] Triage Specialist [phone]

## 2016-11-02 DIAGNOSIS — F259 Schizoaffective disorder, unspecified: Secondary | ICD-10-CM | POA: Diagnosis not present

## 2016-11-02 MED ORDER — HYDROXYZINE HCL 25 MG PO TABS
50.0000 mg | ORAL_TABLET | Freq: Three times a day (TID) | ORAL | Status: DC | PRN
  Administered 2016-11-02 (×2): 50 mg via ORAL
  Filled 2016-11-02 (×2): qty 2

## 2016-11-02 NOTE — ED Notes (Signed)
Hourly rounding reveals patient sleeping in room. No complaints, stable, in no acute distress. Q15 minute rounds and monitoring via Security Cameras to continue. 

## 2016-11-02 NOTE — ED Notes (Signed)
Pt. Pacing in hall, irritable and anxious, c/o insomnia.

## 2016-11-02 NOTE — BH Assessment (Signed)
BHH Assessment Progress Note This writer attempted to evaluate patient this date to discuss treatment progress. Patient is observed  pacing the unit and refused to return to his room per this writer's request. Patient presented with disorganized thoughts and responding to internal stimuli. Per staff patient is irritable and agitated and difficult to redirect. Per collateral from staff nurses, patient has taken medication/s as ordered. Case was staffed with Rene Kocher MD who recommended patient continue to be monitored inpatient as appropriate bed placement is investigated.

## 2016-11-02 NOTE — ED Notes (Signed)
Pt. Agitated, ranting at nurses desk with unrelated sentences.

## 2016-11-02 NOTE — ED Notes (Signed)
Pt. Loudly responding to internal stimuli.

## 2016-11-02 NOTE — ED Notes (Signed)
Pt. Irritated for no apparent reason, reluctantly answering questions with growling/muttered responses.

## 2016-11-02 NOTE — ED Notes (Signed)
Hourly rounding reveals patient pacing in hall. No complaints, stable, in no acute distress. Q15 minute rounds and monitoring via Security Cameras to continue.  

## 2016-11-02 NOTE — ED Notes (Signed)
Hourly rounding reveals patient in rest room. No complaints, stable, in no acute distress. Q15 minute rounds and monitoring via Security Cameras to continue. 

## 2016-11-02 NOTE — Progress Notes (Signed)
Pt A & O to self and place at time of assessment. Presents with blunted affect, pressured speech and irritable / labile mood on interactions. Thought process remains disorganized, observed talking to self and pacing in hall for majority of this shift. Verbal outburst X1 this afternoon. Pt became verbally aggressive towards writer when snack routine was reviewed with him due to multiple snack request. "I'm an American bitch, what the fuck is your nationality, where's your fucking country". Easily agitated when redirections are offered. Scheduled and PRN medications (Vistaril 50 mg) given for as per MD's order with verbal education and effects monitored. Support and encouragement provided to pt.  Q 15 minutes safety checks maintained.

## 2016-11-02 NOTE — ED Notes (Signed)
Pt. Pacing, muttering, occasionally laying down after slamming his bed.

## 2016-11-02 NOTE — ED Notes (Signed)
Hourly rounding reveals patient in room. Stable, in no acute distress. Q15 minute rounds and monitoring via Security Cameras to continue. Snack and beverage given. 

## 2016-11-02 NOTE — ED Notes (Signed)
Report to include Situation, Background, Assessment, and Recommendations received from [facility]. Patient alert, warm and dry, in no acute distress. Patient denies SI, HI, AVH and pain. Patient made aware of Q15 minute rounds and security cameras for their safety. Patient instructed to come to me with needs or concerns.

## 2016-11-02 NOTE — BHH Counselor (Signed)
Clinician received a call from [facility] at [facility] pt is under review.   Redmond Pulling, MS, [facility], [facility] Triage Specialist [phone]

## 2016-11-03 DIAGNOSIS — F259 Schizoaffective disorder, unspecified: Secondary | ICD-10-CM | POA: Diagnosis not present

## 2016-11-03 DIAGNOSIS — R450 Nervousness: Secondary | ICD-10-CM

## 2016-11-03 DIAGNOSIS — F22 Delusional disorders: Secondary | ICD-10-CM | POA: Diagnosis not present

## 2016-11-03 DIAGNOSIS — F39 Unspecified mood [affective] disorder: Secondary | ICD-10-CM

## 2016-11-03 DIAGNOSIS — F17210 Nicotine dependence, cigarettes, uncomplicated: Secondary | ICD-10-CM | POA: Diagnosis not present

## 2016-11-03 DIAGNOSIS — F419 Anxiety disorder, unspecified: Secondary | ICD-10-CM | POA: Diagnosis not present

## 2016-11-03 DIAGNOSIS — R451 Restlessness and agitation: Secondary | ICD-10-CM

## 2016-11-03 DIAGNOSIS — F203 Undifferentiated schizophrenia: Secondary | ICD-10-CM

## 2016-11-03 MED ORDER — STERILE WATER FOR INJECTION IJ SOLN
INTRAMUSCULAR | Status: AC
  Administered 2016-11-03: 1 mL
  Filled 2016-11-03: qty 10

## 2016-11-03 MED ORDER — ZIPRASIDONE MESYLATE 20 MG IM SOLR
10.0000 mg | Freq: Once | INTRAMUSCULAR | Status: AC
  Administered 2016-11-03: 10 mg via INTRAMUSCULAR
  Filled 2016-11-03: qty 20

## 2016-11-03 MED ORDER — DIPHENHYDRAMINE HCL 50 MG/ML IJ SOLN
25.0000 mg | Freq: Once | INTRAMUSCULAR | Status: AC
  Administered 2016-11-03: 25 mg via INTRAMUSCULAR
  Filled 2016-11-03: qty 1

## 2016-11-03 MED ORDER — LORAZEPAM 2 MG/ML IJ SOLN
1.0000 mg | Freq: Once | INTRAMUSCULAR | Status: AC
  Administered 2016-11-03: 1 mg via INTRAMUSCULAR
  Filled 2016-11-03: qty 1
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
## 2016-11-03 NOTE — ED Notes (Signed)
Hourly rounding reveals patient sleeping in room. No complaints, stable, in no acute distress. Q15 minute rounds and monitoring via Security Cameras to continue. 

## 2016-11-03 NOTE — ED Notes (Signed)
Pt. Snatched remote out of MHTs hand and cursed at this nurse when approached about appropriate way to interact with staff.

## 2016-11-03 NOTE — ED Notes (Signed)
Hourly rounding reveals patient pacing in hall. No complaints, stable, in no acute distress. Q15 minute rounds and monitoring via Security Cameras to continue.  

## 2016-11-03 NOTE — ED Notes (Signed)
Hourly rounding reveals patient in room. No complaints, stable, in no acute distress. Q15 minute rounds and monitoring via [employer] to continue. 

## 2016-11-03 NOTE — ED Notes (Signed)
Report to include Situation, Background, Assessment, and Recommendations received from [facility]. Patient alert, warm and dry, in no acute distress. Patient refuses to answer questions about SI, HI, AVH and pain. Patient made aware of Q15 minute rounds and security cameras for their safety. Patient instructed to come to me with needs or concerns.

## 2016-11-03 NOTE — Consult Note (Signed)
[facility] Face-to-Face Psychiatry Consult   Reason for Consult:  Psychosis Referring Physician:  EDP Patient Identification: Jeffrey [name] MRN:  [id number] Principal Diagnosis: Schizophrenia, undifferentiated (HCC) Diagnosis:   Patient Active Problem List   Diagnosis Date Noted  . Dyslipidemia [E78.5] 05/27/2016  . Constipation [K59.00] 05/27/2016  . Schizophrenia, undifferentiated (HCC) [F20.3] 05/27/2016  . Noncompliance [Z91.19] 02/15/2016  . Tobacco use disorder [F17.200] 06/30/2014  . Hypertension [I10] 06/29/2014    Total Time spent with patient: 45 minutes  Subjective:   Jeffrey [name] is a 56 y.o. male patient admitted with psychosis and running away from transitional home.  HPI:  Pt was seen and chart reviewed with Dr Rene Kocher and treatment team. Pt was admitted by his ACT worker after he ran away from his new transitional home. Pt is currently responding to internal stimuli and has been observed talking to people who are not there. Pt spends much of the day pacing the unit but is redirectable. Pt occasionally will have a verbal outburst and swear at other people but quickly calms down. Today Pt was clear when answering questions and stated he slept well and ate breakfast Pt would benefit from an inpatient psychiatric admission for crisis stabilization and medication management.   Past Psychiatric History: As above  Risk to Self: Suicidal Ideation: No Suicidal Intent: No Is patient at risk for suicide?: No Suicidal Plan?: No Access to Means: No What has been your use of drugs/alcohol within the last 12 months?:  (unknown ) How many times?:  (UTA) Triggers for Past Attempts: Unknown Intentional Self Injurious Behavior: None Risk to Others: Homicidal Ideation: No Thoughts of Harm to Others: No Current Homicidal Intent: No Current Homicidal Plan: No Access to Homicidal Means: No Identified Victim:  (none reported) History of harm to others?: Yes Assessment of Violence: In  past 6-12 months Violent Behavior Description:  (hx of aggression toward others at the group home ) Does patient have access to weapons?: No Criminal Charges Pending?:  (UTA) Does patient have a court date:  (UTA) Prior Inpatient Therapy: Prior Inpatient Therapy: Yes Prior Therapy Dates:  (multiple ) Prior Therapy Facilty/Provider(s):  (various ) Reason for Treatment:  (Schizophrenia ) Prior Outpatient Therapy: Prior Outpatient Therapy:  (patient sts that he has a ACT team ...[facility]) Prior Therapy Dates:  (current ) Prior Therapy Facilty/Provider(s):  ([facility] per patient ) Reason for Treatment:  (ACTT services ) Does patient have an ACCT team?: Yes Does patient have Intensive In-House Services?  : No Does patient have [facility] services? : No Does patient have P4CC services?: No  Past Medical History:  Past Medical History:  Diagnosis Date  . Hypertension   . Schizophrenia [facility])     Past Surgical History:  Procedure Laterality Date  . 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 Family Psychiatric  History: Unknown Social History:  History  Alcohol Use No     History  Drug Use No    Social History   Social History  . Marital status: Single    Spouse name: N/A  . Number of children: N/A  . Years of education: N/A   Social History Main Topics  . Smoking status: Current Every Day Smoker    Packs/day: 1.00    Types: Cigarettes  . Smokeless tobacco: Current User  . Alcohol use No  . Drug use: No  . Sexual activity: Not on file  Other Topics Concern  . Not on file   Social History Narrative  . No narrative on file   Additional Social History:    Allergies:  No Known Allergies  Labs: No results found for this or any previous visit (from the past 48 hour(s)).  Current Facility-Administered Medications  Medication Dose Route Frequency Provider Last Rate Last Dose  . cloZAPine (CLOZARIL) tablet 100 mg   100 mg Oral BID Jannifer Franklin, Mojeed, MD   100 mg at 11/03/16 1107  . divalproex (DEPAKOTE) DR tablet 500 mg  500 mg Oral Q12H Akintayo, Mojeed, MD   500 mg at 11/03/16 1106  . hydrOXYzine (ATARAX/VISTARIL) tablet 50 mg  50 mg Oral TID PRN Laveda Abbe, NP   50 mg at 11/02/16 2106  . traZODone (DESYREL) tablet 50 mg  50 mg Oral QHS PRN Oneta Rack, NP   50 mg at 11/02/16 2105   Current Outpatient Prescriptions  Medication Sig Dispense Refill  . cloZAPine (CLOZARIL) 100 MG tablet Take 1 tablet (100 mg total) by mouth daily. (Patient taking differently: Take 100 mg by mouth daily. TAKE 100 MG IN THE MORNING AND 100 MG IN THE EVENING) 30 tablet 0  . simvastatin (ZOCOR) 40 MG tablet Take 1 tablet (40 mg total) by mouth daily at 6 PM. 30 tablet 0  . chlorproMAZINE (THORAZINE) 100 MG tablet Take 1 tablet (100 mg total) by mouth 4 (four) times daily as needed (Agitation). (Patient not taking: Reported on 10/31/2016) 120 tablet 0  . cloZAPine (CLOZARIL) 200 MG tablet Take 2 tablets (400 mg total) by mouth at bedtime. (Patient not taking: Reported on 10/31/2016) 60 tablet 0  . desmopressin (DDAVP) 0.2 MG tablet Take 1 tablet (0.2 mg total) by mouth at bedtime. (Patient not taking: Reported on 10/31/2016) 30 tablet 0  . divalproex (DEPAKOTE) 500 MG DR tablet Take 2 tablets (1,000 mg total) by mouth every 12 (twelve) hours. (Patient not taking: Reported on 10/31/2016) 120 tablet 0  . docusate sodium (COLACE) 100 MG capsule Take 2 capsules (200 mg total) by mouth 2 (two) times daily. (Patient not taking: Reported on 10/31/2016) 120 capsule 0  . FLUoxetine (PROZAC) 10 MG capsule Take 1 capsule (10 mg total) by mouth daily. (Patient not taking: Reported on 10/31/2016) 30 capsule 0  . mometasone-formoterol (DULERA) 200-5 MCG/ACT AERO Inhale 2 puffs into the lungs 2 (two) times daily. (Patient not taking: Reported on 10/31/2016) 2 Inhaler 0  . senna (SENOKOT) 8.6 MG TABS tablet Take 2 tablets (17.2 mg  total) by mouth at bedtime. (Patient not taking: Reported on 10/31/2016) 60 tablet 0  . temazepam (RESTORIL) 7.5 MG capsule Take 1 capsule (7.5 mg total) by mouth at bedtime. (Patient not taking: Reported on 10/31/2016) 30 capsule 0    Musculoskeletal: Strength & Muscle Tone: within normal limits Gait & Station: normal Patient leans: N/A  Psychiatric Specialty Exam: Physical Exam  Constitutional: He appears well-developed and well-nourished.  HENT:  Head: Normocephalic.  Respiratory: Effort normal.  Musculoskeletal: Normal range of motion.  Neurological: He is alert.  Psychiatric: His speech is normal. His mood appears anxious. He is agitated. Thought content is paranoid. Cognition and memory are impaired. He expresses impulsivity.    Review of Systems  Psychiatric/Behavioral: Positive for depression and hallucinations (auditory). Negative for memory loss, substance abuse and suicidal ideas. The patient is nervous/anxious. The patient does not have insomnia.   All other systems reviewed and are negative.   Blood pressure 128/86, pulse 86, temperature (!)  97.5 F (36.4 C), temperature source Oral, resp. rate 17, height 6' (1.829 m), weight 90.7 kg (200 lb), SpO2 99 %.Body mass index is 27.12 kg/m.  General Appearance: Disheveled  Eye Contact:  Fair  Speech:  Clear and Coherent  Volume:  Normal  Mood:  Anxious and Depressed  Affect:  Labile  Thought Process:  Disorganized  Orientation:  Full (Time, Place, and Person)  Thought Content:  Illogical, Hallucinations: Auditory and Paranoid Ideation  Suicidal Thoughts:  No  Homicidal Thoughts:  No  Memory:  Immediate;   Good Recent;   Fair Remote;   Fair  Judgement:  Poor  Insight:  Lacking  Psychomotor Activity:  Increased  Concentration:  Concentration: Fair and Attention Span: Fair  Recall:  [employer] of Knowledge:  Fair  Language:  Good  Akathisia:  No  Handed:  Left  AIMS (if indicated):     Assets:  Tour manager Housing Resilience Social Support  ADL's:  Intact  Cognition:  WNL  Sleep:        Treatment Plan Summary: Daily contact with patient to assess and evaluate symptoms and progress in treatment and Medication management  Continue these medications:  -Depakote DR 500 mg Q12hrs for mood stabilization -Clozaril 100 mg BID for mood stabilization -Vistaril 50 mg TID PRN for anxiety -Trazodone 50 mg QHS PRN   Crisis Stabilization  Disposition: Recommend psychiatric Inpatient admission when medically cleared. TTS to seek placement  Laveda Abbe, NP 11/03/2016 11:39 AM

## 2016-11-03 NOTE — ED Notes (Signed)
Pt. Continues to walk on the other side of the unit despite redirection efforts.

## 2016-11-03 NOTE — ED Notes (Signed)
Hourly rounding reveals patient in pacing in hall. No complaints, stable, in no acute distress. Q15 minute rounds and monitoring via [employer] to continue.

## 2016-11-04 DIAGNOSIS — F259 Schizoaffective disorder, unspecified: Secondary | ICD-10-CM | POA: Diagnosis not present

## 2016-11-04 NOTE — ED Notes (Signed)
Hourly rounding reveals patient in rest room. No complaints, stable, in no acute distress. Q15 minute rounds and monitoring via Security Cameras to continue. 

## 2016-11-04 NOTE — ED Notes (Signed)
Hourly rounding reveals patient in hall. No complaints, stable, in no acute distress. Q15 minute rounds and monitoring via [employer] to continue. 

## 2016-11-04 NOTE — ED Notes (Signed)
Patient alert and oriented states he did not like where he was staying and got into argument with others there. Patient denies SI/HI and A/V/H. Patient is sitting in day area watching television. Provided support and encouragement. Q 15 minute checks in progress and patient remains safe on unit. Monitoring of patient continues.

## 2016-11-04 NOTE — BH Assessment (Signed)
Writer forwarded patient's information to [facility] BMU Attending Physician (Dr. Pucilowska) and [facility] BMU Charge Nurse (Gigi). Due to patient's acuity and acuity of the unit, at this time unable manage patient's symptoms safely.  

## 2016-11-04 NOTE — BH Assessment (Signed)
[facility] Assessment Progress Note  Per Thedore Mins, MD, this pt continues to require psychiatric hospitalization at this time.  Pt is currently under IVC initiated by Ardeth Sportsman, MD.  The following facilities have been contacted to seek placement for this pt, with results as noted:  Beds available, information sent, decision pending:  [facility]   On wait list:  [facility]   Declined:  [facility] (due to chronicity) [facility] (due to sexually inappropriate behavior [facility]   At capacity:  [facility] [place], [place] Triage Specialist [phone]

## 2016-11-04 NOTE — ED Notes (Signed)
Hourly rounding reveals patient sleeping in room. No complaints, stable, in no acute distress. Q15 minute rounds and monitoring via Security Cameras to continue. 

## 2016-11-04 NOTE — ED Notes (Signed)
Pt awake, alert & responsive, no distress noted, eating breakfast at present.  Monitoring for safety, Q 15 min checks in effect.

## 2016-11-04 NOTE — BH Assessment (Signed)
[facility] Assessment Progress Note This Clinical research associate spoke with patient this date to discuss treatment progress and evaluate current mental status. Patient is refusing to answer any of this writer's questions and seems to be responding to internal stimuli as evidenced by patient talking to "this man" while he is under the blanket. Patient will not elaborate on who "this man" is but continues to repeat "this man" over and over. Case was staffed with Akintayo MD, who recommended ongoing inpatient monitoring as appropriate bed placement is investigated.

## 2016-11-04 NOTE — Progress Notes (Signed)
11/04/16 1352:  LRT spoke with pt.  Pt declined activities.  Caroll Rancher, LRT/CTRS

## 2016-11-04 NOTE — ED Notes (Signed)
Hourly rounding reveals patient pacing in hall. No complaints, stable, in no acute distress. Q15 minute rounds and monitoring via Security Cameras to continue.  

## 2016-11-05 DIAGNOSIS — R451 Restlessness and agitation: Secondary | ICD-10-CM | POA: Diagnosis not present

## 2016-11-05 DIAGNOSIS — F22 Delusional disorders: Secondary | ICD-10-CM | POA: Diagnosis not present

## 2016-11-05 DIAGNOSIS — F259 Schizoaffective disorder, unspecified: Secondary | ICD-10-CM | POA: Diagnosis not present

## 2016-11-05 DIAGNOSIS — F419 Anxiety disorder, unspecified: Secondary | ICD-10-CM | POA: Diagnosis not present

## 2016-11-05 DIAGNOSIS — F17210 Nicotine dependence, cigarettes, uncomplicated: Secondary | ICD-10-CM | POA: Diagnosis not present

## 2016-11-05 DIAGNOSIS — F39 Unspecified mood [affective] disorder: Secondary | ICD-10-CM | POA: Diagnosis not present

## 2016-11-05 DIAGNOSIS — R450 Nervousness: Secondary | ICD-10-CM | POA: Diagnosis not present

## 2016-11-05 DIAGNOSIS — F203 Undifferentiated schizophrenia: Secondary | ICD-10-CM | POA: Diagnosis not present

## 2016-11-05 NOTE — ED Notes (Signed)
Pt A&O x 3, no distress noted, calm & cooperative.  Pacing at intervals.  Monitoring for safety, Q 15 min checks in effect.

## 2016-11-05 NOTE — BH Assessment (Addendum)
[facility] Assessment Progress Note  Per Thedore Mins, MD, this pt does not require psychiatric hospitalization at this time.  Pt presents under IVC initiated by Ardeth Sportsman, MD, which Dr Jannifer Franklin has rescinded.  Pt is to be discharged from [facility] with recommendation to continue treatment with the [facility].  This has been included in pt's discharge instructions.  Pt's nurse, Kendal Hymen, has been notified.  At 11:23 this Clinical research associate called [employer] to inform pt's guardian of pt's disposition.  Call rolled to voice mail, and I left a message.  At 11:24 I called the [facility] ACT Team and spoke to [facility] to arrange for pt to be picked up.  She reports that she will need to staff the request, and will have someone call me back.  Return call is pending as of this writing.  Doylene Canning, MA Triage Specialist [phone]  Addendum:  At 13:20 this writer called [facility] ACT Team again to follow up.  They report that pt is affiliated with their ACT Team in [place] [phone]), and inform me that they will call her.  Shortly thereafter Alysha calls back from the [facility] Team.  I inform her that pt has been released from IVC and that he is ready for discharge.  She replies that pt need group home placement.  I staffed this with Laveda Abbe, FNP, and she reports that pt is psychiatrically cleared.  I then called Stacy Gardner, LCSW-A, and reported findings to her.  Pt's nurse, Kendal Hymen, has also been updated.  Doylene Canning, MA Triage Specialist [phone]

## 2016-11-05 NOTE — ED Notes (Signed)
Waiting on social work to fax papers

## 2016-11-05 NOTE — Progress Notes (Signed)
11/05/16 1401:  LRT went to pt room to offer activities, pt was sleep.   Caroll Rancher, LRT/CTRS

## 2016-11-05 NOTE — [facility] Suicide Risk Assessment (Signed)
Suicide Risk Assessment  Discharge Assessment   [facility] Discharge Suicide Risk Assessment   Principal Problem: Schizophrenia, undifferentiated (HCC) Discharge Diagnoses:  Patient Active Problem List   Diagnosis Date Noted  . Dyslipidemia [E78.5] 05/27/2016  . Constipation [K59.00] 05/27/2016  . Schizophrenia, undifferentiated (HCC) [F20.3] 05/27/2016  . Noncompliance [Z91.19] 02/15/2016  . Tobacco use disorder [F17.200] 06/30/2014  . Hypertension [I10] 06/29/2014    Total Time spent with patient: 45 minutes  Musculoskeletal: Strength & Muscle Tone: within normal limits Gait & Station: normal Patient leans: N/A  Psychiatric Specialty Exam: Physical Exam  Constitutional: He is oriented to person, place, and time. He appears well-developed and well-nourished.  HENT:  Head: Normocephalic.  Respiratory: Effort normal.  Musculoskeletal: Normal range of motion.  Neurological: He is alert and oriented to person, place, and time.  Psychiatric: He has a normal mood and affect. His speech is normal and behavior is normal. Thought content is paranoid (history of paranoid schizophrenia). Cognition and memory are impaired. He expresses impulsivity.   Review of Systems  Psychiatric/Behavioral: Negative for depression, memory loss, substance abuse and suicidal ideas. Hallucinations: auditory. The patient is not nervous/anxious and does not have insomnia.   All other systems reviewed and are negative.  Blood pressure 112/85, pulse 73, temperature 99 F (37.2 C), temperature source Oral, resp. rate 16, height 6' (1.829 m), weight 90.7 kg (200 lb), SpO2 97 %.Body mass index is 27.12 kg/m. General Appearance: Casual Eye Contact:  Good Speech:  Clear and Coherent and Garbled Volume:  Decreased Mood:  Euthymic Affect:  Congruent Thought Process:  Disorganized Orientation:  Full (Time, Place, and Person) Thought Content:  Hallucinations: Auditory Suicidal Thoughts:  No Homicidal Thoughts:   No Memory:  Immediate;   Fair Recent;   Fair Remote;   Fair Judgement:  Impaired Insight:  Lacking Psychomotor Activity:  Increased Concentration:  Concentration: Fair and Attention Span: Fair Recall:  [employer] of Knowledge:  Fair Language:  Good Akathisia:  No Handed:  Right AIMS (if indicated):    Assets:  Architect Housing Resilience Social Support ADL's:  Intact Cognition:  WNL   Mental Status Per Nursing Assessment::   On Admission:   Paranoid and responding to internal stimuli  Demographic Factors:  Male, Caucasian, Low socioeconomic status and Unemployed  Loss Factors: Financial problems/change in socioeconomic status  Historical Factors: Impulsivity  Risk Reduction Factors:   Sense of responsibility to family, Living with another person, especially a relative and Positive social support  Continued Clinical Symptoms:  Schizophrenia:   Paranoid or undifferentiated type  Cognitive Features That Contribute To Risk:  Closed-mindedness    Suicide Risk:  Minimal: No identifiable suicidal ideation.  Patients presenting with no risk factors but with morbid ruminations; may be classified as minimal risk based on the severity of the depressive symptoms    Plan Of Care/Follow-up recommendations:  Activity:  as tolerated Diet:  Heart Healthy  Laveda Abbe, NP 11/05/2016, 11:49 AM

## 2016-11-05 NOTE — ED Notes (Signed)
Social work stated that they must reach legal guardian before discharging pt.  They will provide shelter info after that time.

## 2016-11-05 NOTE — Consult Note (Signed)
[facility] Face-to-Face Psychiatry Consult   Reason for Consult:  Psychosis Referring Physician:  EDP Patient Identification: Jeffrey [name] MRN:  [id number] Principal Diagnosis: Schizophrenia, undifferentiated (HCC) Diagnosis:   Patient Active Problem List   Diagnosis Date Noted  . Dyslipidemia [E78.5] 05/27/2016  . Constipation [K59.00] 05/27/2016  . Schizophrenia, undifferentiated (HCC) [F20.3] 05/27/2016  . Noncompliance [Z91.19] 02/15/2016  . Tobacco use disorder [F17.200] 06/30/2014  . Hypertension [I10] 06/29/2014    Total Time spent with patient: 45 minutes  Subjective:   Jeffrey [name] is a 56 y.o. male patient admitted with active psychosis and responding to internal stimuli.  HPI: Pt was seen and chart reviewed with treatment team and Dr Jannifer Franklin. Pt has remained calm and cooperative in the SAP[facility]. Pt is easily redirectable.  Pt denies suicidal/homicidal ideation, denies auditory/visual hallucinations but does appear to be responding to internal stimuli. Pt paces in the hallway and has been observed talking to people who are not there but is not attempting to bother other people and has had no aggressive or violent outbursts. Pt appears to be at his baseline. Pt has an ACT team through [facility] and they will be contacted to come to [facility] and transport Pt home.  Pt is psychiatrically clear for discharge.   Past Psychiatric History: As above  Risk to Self: None Risk to Others: None Prior Inpatient Therapy: Prior Inpatient Therapy: Yes Prior Therapy Dates:  (multiple ) Prior Therapy Facilty/Provider(s):  (various ) Reason for Treatment:  (Schizophrenia ) Prior Outpatient Therapy: Prior Outpatient Therapy:  (patient sts that he has a ACT team ...[facility]) Prior Therapy Dates:  (current ) Prior Therapy Facilty/Provider(s):  ([facility] per patient ) Reason for Treatment:  (ACTT services ) Does patient have an ACCT team?: Yes Does patient have Intensive In-House Services?  : No Does patient have  [facility] services? : No Does patient have P4CC services?: No  Past Medical History:  Past Medical History:  Diagnosis Date  . Hypertension   . Schizophrenia [facility])     Past Surgical History:  Procedure Laterality Date  . 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 Family Psychiatric  History: [place] Social History:  History  Alcohol Use No     History  Drug Use No    Social History   Social History  . Marital status: Single    Spouse name: N/A  . Number of children: N/A  . Years of education: N/A   Social History Main Topics  . Smoking status: Current Every Day Smoker    Packs/day: 1.00    Types: Cigarettes  . Smokeless tobacco: Current User  . Alcohol use No  . Drug use: No  . Sexual activity: Not on file   Other Topics Concern  . Not on file   Social History Narrative  . No narrative on file   Additional Social History:    Allergies:  No Known Allergies  Labs: No results found for this or any previous visit (from the past 48 hour(s)).  Current Facility-Administered Medications  Medication Dose Route Frequency Provider Last Rate Last Dose  . cloZAPine (CLOZARIL) tablet 100 mg  100 mg Oral BID Jannifer Franklin, Danish Ruffins, MD   100 mg at 11/05/16 1057  . divalproex (DEPAKOTE) DR tablet 500 mg  500 mg Oral Q12H Bo Teicher, MD   500 mg at 11/05/16 1057  . hydrOXYzine (ATARAX/VISTARIL) tablet 50 mg  50 mg Oral  TID PRN Laveda Abbe, NP   50 mg at 11/02/16 2106  . traZODone (DESYREL) tablet 50 mg  50 mg Oral QHS PRN Oneta Rack, NP   50 mg at 11/04/16 2149   Current Outpatient Prescriptions  Medication Sig Dispense Refill  . cloZAPine (CLOZARIL) 100 MG tablet Take 1 tablet (100 mg total) by mouth daily. (Patient taking differently: Take 100 mg by mouth daily. TAKE 100 MG IN THE MORNING AND 100 MG IN THE EVENING) 30 tablet 0  . simvastatin (ZOCOR) 40 MG tablet Take 1 tablet (40 mg total) by  mouth daily at 6 PM. 30 tablet 0  . chlorproMAZINE (THORAZINE) 100 MG tablet Take 1 tablet (100 mg total) by mouth 4 (four) times daily as needed (Agitation). (Patient not taking: Reported on 10/31/2016) 120 tablet 0  . cloZAPine (CLOZARIL) 200 MG tablet Take 2 tablets (400 mg total) by mouth at bedtime. (Patient not taking: Reported on 10/31/2016) 60 tablet 0  . desmopressin (DDAVP) 0.2 MG tablet Take 1 tablet (0.2 mg total) by mouth at bedtime. (Patient not taking: Reported on 10/31/2016) 30 tablet 0  . divalproex (DEPAKOTE) 500 MG DR tablet Take 2 tablets (1,000 mg total) by mouth every 12 (twelve) hours. (Patient not taking: Reported on 10/31/2016) 120 tablet 0  . docusate sodium (COLACE) 100 MG capsule Take 2 capsules (200 mg total) by mouth 2 (two) times daily. (Patient not taking: Reported on 10/31/2016) 120 capsule 0  . FLUoxetine (PROZAC) 10 MG capsule Take 1 capsule (10 mg total) by mouth daily. (Patient not taking: Reported on 10/31/2016) 30 capsule 0  . mometasone-formoterol (DULERA) 200-5 MCG/ACT AERO Inhale 2 puffs into the lungs 2 (two) times daily. (Patient not taking: Reported on 10/31/2016) 2 Inhaler 0  . senna (SENOKOT) 8.6 MG TABS tablet Take 2 tablets (17.2 mg total) by mouth at bedtime. (Patient not taking: Reported on 10/31/2016) 60 tablet 0  . temazepam (RESTORIL) 7.5 MG capsule Take 1 capsule (7.5 mg total) by mouth at bedtime. (Patient not taking: Reported on 10/31/2016) 30 capsule 0    Musculoskeletal: Strength & Muscle Tone: within normal limits Gait & Station: normal Patient leans: N/A  Psychiatric Specialty Exam: Physical Exam  Constitutional: He is oriented to person, place, and time. He appears well-developed and well-nourished.  HENT:  Head: Normocephalic.  Respiratory: Effort normal.  Musculoskeletal: Normal range of motion.  Neurological: He is alert and oriented to person, place, and time.  Psychiatric: He has a normal mood and affect. His speech is normal  and behavior is normal. Thought content is paranoid (history of paranoid schizophrenia). Cognition and memory are impaired. He expresses impulsivity.    Review of Systems  Psychiatric/Behavioral: Negative for depression, memory loss, substance abuse and suicidal ideas. Hallucinations: auditory. The patient is not nervous/anxious and does not have insomnia.   All other systems reviewed and are negative.   Blood pressure 112/85, pulse 73, temperature 99 F (37.2 C), temperature source Oral, resp. rate 16, height 6' (1.829 m), weight 90.7 kg (200 lb), SpO2 97 %.Body mass index is 27.12 kg/m.  General Appearance: Casual  Eye Contact:  Good  Speech:  Clear and Coherent and Garbled  Volume:  Decreased  Mood:  Euthymic  Affect:  Congruent  Thought Process:  Disorganized  Orientation:  Full (Time, Place, and Person)  Thought Content:  Hallucinations: Auditory  Suicidal Thoughts:  No  Homicidal Thoughts:  No  Memory:  Immediate;   Fair Recent;   Fair Remote;  Fair  Judgement:  Impaired  Insight:  Lacking  Psychomotor Activity:  Increased  Concentration:  Concentration: Fair and Attention Span: Fair  Recall:  [employer] of Knowledge:  Fair  Language:  Good  Akathisia:  No  Handed:  Right  AIMS (if indicated):     Assets:  Architect Housing Resilience Social Support  ADL's:  Intact  Cognition:  WNL  Sleep:        Treatment Plan Summary: Plan Paranoid Schizophrenia  Discharge Home Take all medications as prescribed Follow up with ACT team  Avoid the use of alcohol and illicit drugs  Disposition: No evidence of imminent risk to self or others at present.   Patient does not meet criteria for psychiatric inpatient admission. Supportive therapy provided about ongoing stressors. Discussed crisis plan, support from social network, calling 911, coming to the Emergency Department, and calling Suicide Hotline.  Laveda Abbe,  NP 11/05/2016 11:33 AM  Patient seen face-to-face for psychiatric evaluation, chart reviewed and case discussed with the physician extender and developed treatment plan. Reviewed the information documented and agree with the treatment plan. Thedore Mins, MD

## 2016-11-05 NOTE — ED Notes (Signed)
Spoke with social work, spoke with [facility], could not get ahold of legal guardian but left a vm with legal guardian ron hardy [phone] [facility] said they found him placement but he can not go until tomorrow.  Spoke with laurie parks NP and she said he could stay until tomorrow am

## 2016-11-05 NOTE — Progress Notes (Cosign Needed)
Update: CSW intern spoke with Rolland Bimler (ACT team coordinator) [phone] regarding patients discharge with homeless shelter resources due to [facility] not accepting patient back. Alysha stated [facility] ACT Team had been attempted to place patient into group home at this time. Alysha voiced her concerns with patient being discharged at this time and voiced her concerns with [facility] not being contacted before discharge. CSW intern received call back from Corwin Levins patients legal guardian [phone]) who stated "Rolland Bimler should be given some time to get him into the group home tonight". CSW intern informed legal guardian patient would need to have discharge plans by 6PM- whether to a group home or homeless shelter.   CSW intern spoke via telephone with patients transitional care program, Jinny Sanders in [place]. During the conversation writer informed Jinny Sanders,  that patient was ready for discharge and would she be willing to take him back. Jinny Sanders exemplified concerns to Clinical research associate about taking patient back due to his frequent eloping. CSW intern informed Zoe Lan that we would need an answer by 2:30pm. Jinny Sanders told writer if Clinical research associate did not hear back from her by then the answer was a "no".  If CSW intern does not hear back from Zoe Lan by 2:30pm CSW intern will discharge patient with homeless resources. Writer will also contact patients ACT team to inform them of current plan and provide transportation.   Nicoletta Dress, Social Work Intern  [phone]

## 2016-11-05 NOTE — Discharge Instructions (Addendum)
For your behavioral health needs, you are advised to continue treatment with the [facility] ACT Team:       [facility]      [address]      [place], [place]  [address]      [phone]

## 2016-11-06 ENCOUNTER — Emergency Department (HOSPITAL_COMMUNITY): Payer: Medicare Other

## 2016-11-06 DIAGNOSIS — F259 Schizoaffective disorder, unspecified: Secondary | ICD-10-CM | POA: Diagnosis not present

## 2016-11-06 MED ORDER — SIMVASTATIN 40 MG PO TABS: 40.0000 mg | ORAL_TABLET | Freq: Every day | ORAL | 0 refills | Status: AC

## 2016-11-06 MED ORDER — DIVALPROEX SODIUM 500 MG PO DR TAB: 1000.0000 mg | DELAYED_RELEASE_TABLET | Freq: Two times a day (BID) | ORAL | 0 refills | Status: AC

## 2016-11-06 NOTE — Progress Notes (Signed)
2nd shift ED CSW received a call from [facility] who reported [facility].  CSW spoke to pt's RN who confirmed this was not done unless this was specifically requested and approve by the EDP.  CSW spoke to the EDP covering [facility] where pt was D/C'd from and EDP confriemd pt was D/C'd with recommedation to be prescribed Zocor, Depakote and Clozaril.  Per RN, pt is to take Depakote and Clozaril before bed tonight.  EDP sent pt's scripts for Zocor and Depakote to [facility] at the group home's request, but is not licensed to prescribe Clozaril.  CSW relayed this information to [facility] from [facility] at [phone] and to Marilynne HalstedJanice Reeves of [facility] at ph: [phone].  Rolland Bimlerlysha stated she would request Clozaril to be prescribed by [facility]'s psychiatrist on the morning of 10/18.  Alysha and Miss Anise SalvoReeves were appreciative and thanked the CSW.  Please reconsult if future social work needs arise.  CSW signing off, as social work intervention is no longer needed.  Dorothe PeaJonathan F. Audra Bellard, LCSW, LCAS, [facility] Clinical Social Worker Ph: [phone]

## 2016-11-06 NOTE — ED Notes (Signed)
Pt discharged home. Discharged instructions read to pt who verbalized understanding. All belongings returned to pt who signed for same. Denies SI/HI, is not delusional and not responding to internal stimuli. Escorted pt to the ED exit.  Pt picked up by his group ome.

## 2016-11-06 NOTE — NC FL2 (Signed)
[place] MEDICAID FL2 LEVEL OF CARE SCREENING TOOL     IDENTIFICATION  Patient Name: Jeffrey RevelsMichael T Halvorson Birthdate: 09/26/1960 Sex: male Admission Date (Current Location): 10/31/2016  [facility] [place]   Facility and Address[facility]  [facility],  [address], [place] [id number]      Provider Number: [id number]  Attending Physician Name and Address:  Default, Provider, MD  Relative Name and Phone Number:       Current Level of Care: Hospital Re[facility] Prior Approval Number:    Date Approved/Denied:   PASRR Number:   [id number]  Discharge Plan: Other (Comment) (Family Care home )    Current Diagnoses: Patient Active Problem List   Diagnosis Date Noted  . Dyslipidemia 05/27/2016  . Constipation 05/27/2016  . Schizophrenia, undifferentiated (HCC) 05/27/2016  . Noncompliance 02/15/2016  . Tobacco use disorder 06/30/2014  . Hypertension 06/29/2014    Orientation RESPIRATION BLADDER Height & Weight     Self, Situation, Time, Place  Normal Continent Weight: 200 lb (90.7 kg) Height:  6' (182.9 cm)  BEHAVIORAL SYMPTOMS/MOOD NEUROLOGICAL BOWEL NUTRITION STATUS      Continent  (Regular )  AMBULATORY STATUS COMMUNICATION OF NEEDS Skin   Independent Verbally Normal                       Personal Care Assistance Level of Assistance  Bathing, Feeding, Dressing Bathing Assistance: Independent Feeding assistance: Independent Dressing Assistance: Independent     Functional Limitations Info    Sight Info: Adequate Hearing Info: Adequate Speech Info: Adequate    SPECIAL CARE FACTORS FREQUENCY                       Contractures      Additional Factors Info  Code Status, Allergies Code Status Info: Full code  Allergies Info: NKA            Current Medications (11/06/2016):  This is the current hospital active medication list Current Facility-Administered Medications  Medication Dose  Route Frequency Provider Last Rate Last Dose  . cloZAPine (CLOZARIL) tablet 100 mg  100 mg Oral BID Thedore MinsAkintayo, Mojeed, MD   100 mg at 11/06/16 0923  . divalproex (DEPAKOTE) DR tablet 500 mg  500 mg Oral Q12H Akintayo, Mojeed, MD   500 mg at 11/06/16 0923  . hydrOXYzine (ATARAX/VISTARIL) tablet 50 mg  50 mg Oral TID PRN Laveda AbbeParks, Laurie Britton, NP   50 mg at 11/02/16 2106  . traZODone (DESYREL) tablet 50 mg  50 mg Oral QHS PRN Oneta RackLewis, Tanika N, NP   50 mg at 11/05/16 2114   Current Outpatient Prescriptions  Medication Sig Dispense Refill  . cloZAPine (CLOZARIL) 100 MG tablet Take 1 tablet (100 mg total) by mouth daily. (Patient taking differently: Take 100 mg by mouth daily. TAKE 100 MG IN THE MORNING AND 100 MG IN THE EVENING) 30 tablet 0  . simvastatin (ZOCOR) 40 MG tablet Take 1 tablet (40 mg total) by mouth daily at 6 PM. 30 tablet 0  . chlorproMAZINE (THORAZINE) 100 MG tablet Take 1 tablet (100 mg total) by mouth 4 (four) times daily as needed (Agitation). (Patient not taking: Reported on 10/31/2016) 120 tablet 0  . cloZAPine (CLOZARIL) 200 MG tablet Take 2 tablets (400 mg total) by mouth at bedtime. (Patient not taking: Reported on 10/31/2016) 60 tablet 0  . desmopressin (DDAVP) 0.2 MG tablet Take 1 tablet (0.2 mg  total) by mouth at bedtime. (Patient not taking: Reported on 10/31/2016) 30 tablet 0  . divalproex (DEPAKOTE) 500 MG DR tablet Take 2 tablets (1,000 mg total) by mouth every 12 (twelve) hours. (Patient not taking: Reported on 10/31/2016) 120 tablet 0  . docusate sodium (COLACE) 100 MG capsule Take 2 capsules (200 mg total) by mouth 2 (two) times daily. (Patient not taking: Reported on 10/31/2016) 120 capsule 0  . FLUoxetine (PROZAC) 10 MG capsule Take 1 capsule (10 mg total) by mouth daily. (Patient not taking: Reported on 10/31/2016) 30 capsule 0  . mometasone-formoterol (DULERA) 200-5 MCG/ACT AERO Inhale 2 puffs into the lungs 2 (two) times daily. (Patient not taking: Reported on  10/31/2016) 2 Inhaler 0  . senna (SENOKOT) 8.6 MG TABS tablet Take 2 tablets (17.2 mg total) by mouth at bedtime. (Patient not taking: Reported on 10/31/2016) 60 tablet 0  . temazepam (RESTORIL) 7.5 MG capsule Take 1 capsule (7.5 mg total) by mouth at bedtime. (Patient not taking: Reported on 10/31/2016) 30 capsule 0     Discharge Medications: Please see discharge summary for a list of discharge medications.  Relevant Imaging Results:  Relevant Lab Results:   Additional Information SS#[id number]  Donnie Coffin, LCSW

## 2016-11-06 NOTE — ED Notes (Signed)
Introduced self to patient. Pt oriented to unit expectations.  Assessed pt for:  A) Anxiety &/or agitation: Pt has been calm and cooperative, but anxious to be discharged. He is tired of staying in bed and sleeping.   S) Safety:  Safety maintained with q-15-minute checks and hourly rounds by staff.  A) ADLs: Pt able to perform ADLs independently.  P) Pick-Up (room cleanliness): Pt's room clean and free of clutter.

## 2016-11-06 NOTE — Progress Notes (Signed)
11/06/16 1403:  LRT went to pt room to offer activities, pt was sleep.   Caroll RancherMarjette Anyah Swallow, LRT/CTRS

## 2016-11-06 NOTE — Progress Notes (Addendum)
UPDATE 11AM: CSW spoke with Alysha with [facility] ACT Team regarding discharge plans. Patient has been accepted to [facility] [phone]) group home for today. Alysha with [facility] will pick [phone] up this PM. Group home has requested chest xray to and FL2 to be completed. EDP has ordered chest XRAY. CSW will continue to follow up for discharge plans and complete FL2   CSW spoke with patient via bedside regarding discharge plans. Patient is requesting to leave at this time- CSW has attempted to contact patients legal guardian, Corwin LevinsRon Hardie, and [facility] ACT Team with no answer.   Per notes, ACT Team stated they found patient a group home and he would be able to go today. CSW will continue to reach out to legal guardian.   Stacy GardnerErin Spiro Ausborn, [facility] CenterCSWA Emergency Room Clinical Social Worker [phone]

## 2016-12-25 ENCOUNTER — Other Ambulatory Visit: Payer: Self-pay

## 2016-12-25 ENCOUNTER — Emergency Department
Admission: EM | Admit: 2016-12-25 | Discharge: 2017-01-03 | Disposition: A | Discharge status: Home / Self Care | Payer: Medicare Other | Attending: Student in an Organized Health Care Education/Training Program | Admitting: Student in an Organized Health Care Education/Training Program

## 2016-12-25 DIAGNOSIS — S58119A Complete traumatic amputation at level between elbow and wrist, unspecified arm, initial encounter: Secondary | ICD-10-CM

## 2016-12-25 DIAGNOSIS — Z79899 Other long term (current) drug therapy: Secondary | ICD-10-CM | POA: Diagnosis not present

## 2016-12-25 DIAGNOSIS — I10 Essential (primary) hypertension: Secondary | ICD-10-CM | POA: Diagnosis not present

## 2016-12-25 DIAGNOSIS — K5900 Constipation, unspecified: Secondary | ICD-10-CM | POA: Diagnosis present

## 2016-12-25 DIAGNOSIS — Z9119 Patient's noncompliance with other medical treatment and regimen: Secondary | ICD-10-CM

## 2016-12-25 DIAGNOSIS — E785 Hyperlipidemia, unspecified: Secondary | ICD-10-CM | POA: Diagnosis present

## 2016-12-25 DIAGNOSIS — F209 Schizophrenia, unspecified: Secondary | ICD-10-CM | POA: Insufficient documentation

## 2016-12-25 DIAGNOSIS — F17210 Nicotine dependence, cigarettes, uncomplicated: Secondary | ICD-10-CM | POA: Insufficient documentation

## 2016-12-25 DIAGNOSIS — F203 Undifferentiated schizophrenia: Secondary | ICD-10-CM | POA: Diagnosis present

## 2016-12-25 DIAGNOSIS — F29 Unspecified psychosis not due to a substance or known physiological condition: Secondary | ICD-10-CM | POA: Diagnosis present

## 2016-12-25 DIAGNOSIS — Z91199 Patient's noncompliance with other medical treatment and regimen due to unspecified reason: Secondary | ICD-10-CM

## 2016-12-25 DIAGNOSIS — J449 Chronic obstructive pulmonary disease, unspecified: Secondary | ICD-10-CM

## 2016-12-25 LAB — ETHANOL: Alcohol, Ethyl (B): <10 mg/dL (ref <10)

## 2016-12-25 LAB — COMPREHENSIVE METABOLIC PANEL
ALBUMIN: 4.4 g/dL (ref 3.5–5.0)
ALT: 16 U/L — AB (ref 17–63)
AST: 16 U/L (ref 15–41)
Alkaline Phosphatase: 49 U/L (ref 38–126)
Anion gap: 12 (ref 5–15)
BILIRUBIN TOTAL: 0.7 mg/dL (ref 0.3–1.2)
BUN: 16 mg/dL (ref 6–20)
CALCIUM: 9.6 mg/dL (ref 8.9–10.3)
CO2: 20 mmol/L — AB (ref 22–32)
CREATININE: 0.76 mg/dL (ref 0.61–1.24)
Chloride: 105 mmol/L (ref 101–111)
GFR calc Af Amer: >60 mL/min (ref >60)
GFR calc non Af Amer: >60 mL/min (ref >60)
GLUCOSE: 117 mg/dL — AB (ref 65–99)
Potassium: 3.8 mmol/L (ref 3.5–5.1)
Sodium: 137 mmol/L (ref 135–145)
Total Protein: 6.8 g/dL (ref 6.5–8.1)

## 2016-12-25 LAB — CBC
HEMATOCRIT: 43.2 % (ref 40.0–52.0)
HEMOGLOBIN: 14.8 g/dL (ref 13.0–18.0)
MCH: 30.4 pg (ref 26.0–34.0)
MCHC: 34.3 g/dL (ref 32.0–36.0)
MCV: 88.7 fL (ref 80.0–100.0)
Platelets: 292 10*3/uL (ref 150–440)
RBC: 4.87 MIL/uL (ref 4.40–5.90)
RDW: 13.5 % (ref 11.5–14.5)
WBC: 8.7 10*3/uL (ref 3.8–10.6)

## 2016-12-25 LAB — SALICYLATE LEVEL: Salicylate Lvl: <7 mg/dL (ref 2.8–30.0)

## 2016-12-25 LAB — ACETAMINOPHEN LEVEL

## 2016-12-25 MED ORDER — TEMAZEPAM 7.5 MG PO CAPS
7.5000 mg | ORAL_CAPSULE | Freq: Every day | ORAL | Status: DC
  Administered 2016-12-25 – 2017-01-02 (×9): 7.5 mg via ORAL
  Filled 2016-12-25 (×9): qty 1

## 2016-12-25 MED ORDER — CHLORPROMAZINE HCL 25 MG PO TABS
100.0000 mg | ORAL_TABLET | Freq: Four times a day (QID) | ORAL | Status: DC | PRN
  Administered 2016-12-25 – 2016-12-30 (×4): 100 mg via ORAL
  Filled 2016-12-25: qty 1
  Filled 2016-12-25 (×3): qty 4

## 2016-12-25 MED ORDER — FLUOXETINE HCL 10 MG PO CAPS
10.0000 mg | ORAL_CAPSULE | Freq: Every day | ORAL | Status: DC
  Administered 2016-12-25 – 2017-01-03 (×10): 10 mg via ORAL
  Filled 2016-12-25 (×10): qty 1

## 2016-12-25 MED ORDER — CLOZAPINE 100 MG PO TABS
400.0000 mg | ORAL_TABLET | Freq: Every day | ORAL | Status: DC
  Administered 2016-12-25: 400 mg via ORAL
  Filled 2016-12-25 (×2): qty 4

## 2016-12-25 MED ORDER — DIVALPROEX SODIUM 500 MG PO DR TAB
1000.0000 mg | DELAYED_RELEASE_TABLET | Freq: Two times a day (BID) | ORAL | Status: DC
  Administered 2016-12-25 – 2016-12-26 (×2): 1000 mg via ORAL
  Filled 2016-12-25 (×2): qty 2

## 2016-12-25 MED ORDER — CLOZAPINE 100 MG PO TABS
100.0000 mg | ORAL_TABLET | Freq: Every day | ORAL | Status: DC
  Administered 2016-12-25 – 2017-01-03 (×10): 100 mg via ORAL
  Filled 2016-12-25 (×2): qty 4
  Filled 2016-12-25: qty 1
  Filled 2016-12-25: qty 4
  Filled 2016-12-25 (×7): qty 1

## 2016-12-25 NOTE — ED Notes (Signed)
IVC/Consult Pending  

## 2016-12-25 NOTE — ED Triage Notes (Signed)
[place] [employer] Officer[employer]. Pt released from jail and charges dropped. Court orders for psychiatric evaluation. Pt is not under IVC. Pt mumbling words that do not make since at this time. Pt alert to self, place, disoriented to time and situation.

## 2016-12-25 NOTE — ED Provider Notes (Signed)
[facility] Emergency Department Provider Note    First MD Initiated Contact with Patient 12/25/16 1704     (approximate)  I have reviewed the triage vital signs and the nursing notes.   HISTORY  Chief Complaint Psychiatric Evaluation  Level V Caveat:  psychosis  HPI Jeffrey [name] is a 56 y.o. male history of schizophrenia presents under police custody after being released from the jail today with court order for psychiatric evaluation.  Patient was reportedly in prison for trespassing and served his time today but has reportedly been off medications is becoming more confused reportedly paranoid and the concern was for ptosis.  Patient unable to complete a sentence.  Speaking about [place] ".  Denies any pain.  States he does want something to drink and eat.  Denies any fevers.  Past Medical History:  Diagnosis Date  . Hypertension   . Schizophrenia (HCC)    No family history on file. Past Surgical History:  Procedure Laterality Date  . 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 Dyslipidemia 05/27/2016  . Constipation 05/27/2016  . Schizophrenia, undifferentiated (HCC) 05/27/2016  . Noncompliance 02/15/2016  . Tobacco use disorder 06/30/2014  . Hypertension 06/29/2014      Prior to Admission medications   Medication Sig Start Date End Date Taking? Authorizing Provider  chlorproMAZINE (THORAZINE) 100 MG tablet Take 1 tablet (100 mg total) by mouth 4 (four) times daily as needed (Agitation). Patient not taking: Reported on 10/31/2016 10/24/16   Clapacs, Jackquline DenmarkJohn T, MD  cloZAPine (CLOZARIL) 100 MG tablet Take 1 tablet (100 mg total) by mouth daily. Patient taking differently: Take 100 mg by mouth daily. TAKE 100 MG IN THE MORNING AND 100 MG IN THE EVENING 10/25/16   Clapacs, Jackquline DenmarkJohn T, MD  cloZAPine (CLOZARIL) 200 MG tablet Take 2 tablets (400 mg total) by  mouth at bedtime. Patient not taking: Reported on 10/31/2016 10/24/16   Clapacs, Jackquline DenmarkJohn T, MD  desmopressin (DDAVP) 0.2 MG tablet Take 1 tablet (0.2 mg total) by mouth at bedtime. Patient not taking: Reported on 10/31/2016 10/24/16   Clapacs, Jackquline DenmarkJohn T, MD  divalproex (DEPAKOTE) 500 MG DR tablet Take 2 tablets (1,000 mg total) by mouth every 12 (twelve) hours. 11/06/16   Shaune PollackIsaacs, Cameron, MD  docusate sodium (COLACE) 100 MG capsule Take 2 capsules (200 mg total) by mouth 2 (two) times daily. Patient not taking: Reported on 10/31/2016 10/24/16   Clapacs, Jackquline DenmarkJohn T, MD  FLUoxetine (PROZAC) 10 MG capsule Take 1 capsule (10 mg total) by mouth daily. Patient not taking: Reported on 10/31/2016 10/25/16   Clapacs, Jackquline DenmarkJohn T, MD  mometasone-formoterol (DULERA) 200-5 MCG/ACT AERO Inhale 2 puffs into the lungs 2 (two) times daily. Patient not taking: Reported on 10/31/2016 10/24/16   Clapacs, Jackquline DenmarkJohn T, MD  senna (SENOKOT) 8.6 MG TABS tablet Take 2 tablets (17.2 mg total) by mouth at bedtime. Patient not taking: Reported on 10/31/2016 10/24/16   Clapacs, Jackquline DenmarkJohn T, MD  simvastatin (ZOCOR) 40 MG tablet Take 1 tablet (40 mg total) by mouth daily at 6 PM. 11/06/16   Shaune PollackIsaacs, Cameron, MD  temazepam (RESTORIL) 7.5 MG capsule Take 1 capsule (7.5 mg total) by mouth at bedtime. Patient not taking: Reported on 10/31/2016 10/24/16   Clapacs, Jackquline DenmarkJohn T, MD    Allergies Patient has no known allergies.    Social History Social History  Tobacco Use  . Smoking status: Current Every Day Smoker    Packs/day: 1.00    Types: Cigarettes  . Smokeless tobacco: Current User  Substance Use Topics  . Alcohol use: No  . Drug use: No    Review of Systems Patient denies headaches, rhinorrhea, blurry vision, numbness, shortness of breath, chest pain, edema, cough, abdominal pain, nausea, vomiting, diarrhea, dysuria, fevers, rashes or hallucinations unless otherwise stated above in HPI. ____________________________________________   PHYSICAL  EXAM:  VITAL SIGNS: Vitals:   12/25/16 1549  BP: 121/79  Pulse: (!) 112  Resp: 18  Temp: 99.6 F (37.6 C)  SpO2: 99%    Constitutional: Alert and oriented. Well appearing and in no acute distress. Eyes: Conjunctivae are normal.  Head: Atraumatic. Nose: No congestion/rhinnorhea. Mouth/Throat: Mucous membranes are moist.   Neck: No stridor. Painless ROM.  Cardiovascular: Normal rate, regular rhythm. Grossly normal heart sounds.  Good peripheral circulation. Respiratory: Normal respiratory effort.  No retractions. Lungs CTAB. Gastrointestinal: Soft and nontender. No distention. No abdominal bruits. No CVA tenderness. Genitourinary:  Musculoskeletal: No lower extremity tenderness nor edema.  No joint effusions. Neurologic:  Normal speech and language. No gross focal neurologic deficits are appreciated. No facial droop Skin:  Skin is warm, dry and intact. No rash noted. Psychiatric: Pleasant but very disorganized thought process and lose associations  ____________________________________________   LABS (all labs ordered are listed, but only abnormal results are displayed)  Results for orders placed or performed during the hospital encounter of 12/25/16 (from the past 24 hour(s))  Comprehensive metabolic panel     Status: Abnormal   Collection Time: 12/25/16  3:49 PM  Result Value Ref Range   Sodium 137 135 - 145 mmol/L   Potassium 3.8 3.5 - 5.1 mmol/L   Chloride 105 101 - 111 mmol/L   CO2 20 (L) 22 - 32 mmol/L   Glucose, Bld 117 (H) 65 - 99 mg/dL   BUN 16 6 - 20 mg/dL   Creatinine, Ser 4.090.76 0.61 - 1.24 mg/dL   Calcium 9.6 8.9 - 81.110.3 mg/dL   Total Protein 6.8 6.5 - 8.1 g/dL   Albumin 4.4 3.5 - 5.0 g/dL   AST 16 15 - 41 U/L   ALT 16 (L) 17 - 63 U/L   Alkaline Phosphatase 49 38 - 126 U/L   Total Bilirubin 0.7 0.3 - 1.2 mg/dL   GFR calc non Af Amer >60 >60 mL/min   GFR calc Af Amer >60 >60 mL/min   Anion gap 12 5 - 15  Ethanol     Status: None   Collection Time: 12/25/16   3:49 PM  Result Value Ref Range   Alcohol, Ethyl (B) <10 <10 mg/dL  Salicylate level     Status: None   Collection Time: 12/25/16  3:49 PM  Result Value Ref Range   Salicylate Lvl <7.0 2.8 - 30.0 mg/dL  Acetaminophen level     Status: Abnormal   Collection Time: 12/25/16  3:49 PM  Result Value Ref Range   Acetaminophen (Tylenol), Serum <10 (L) 10 - 30 ug/mL  cbc     Status: None   Collection Time: 12/25/16  3:49 PM  Result Value Ref Range   WBC 8.7 3.8 - 10.6 K/uL   RBC 4.87 4.40 - 5.90 MIL/uL   Hemoglobin 14.8 13.0 - 18.0 g/dL   HCT 91.443.2 78.240.0 - 95.652.0 %   MCV 88.7 80.0 - 100.0 fL   MCH 30.4 26.0 - 34.0 pg   MCHC  34.3 32.0 - 36.0 g/dL   RDW 16.1 09.6 - 04.5 %   Platelets 292 150 - 440 K/uL   ____________________________________________ ____________________________________________  RADIOLOGY   ____________________________________________   PROCEDURES  Procedure(s) performed:  Procedures    Critical Care performed: no ____________________________________________   INITIAL IMPRESSION / ASSESSMENT AND PLAN / ED COURSE  Pertinent labs & imaging results that were available during my care of the patient were reviewed by me and considered in my medical decision making (see chart for details).  DDX: Psychosis, delirium, medication effect, noncompliance, polysubstance abuse, Si, Hi, depression   OLUMIDE DOLINGER is a 56 y.o. who presents to the ED with h/p schizophrenia for evaluation of abnormal behavior.  Laboratory testing was ordered to evaluation for underlying electrolyte derangement or signs of underlying organic pathology to explain today's presentation.  Based on history and physical and laboratory evaluation, it appears that the patient's presentation is 2/2 underlying psychiatric disorder and will require further evaluation and management by inpatient psychiatry.  Patient was  made an IVC due to disorganized behavior off of meds.  Disposition pending psychiatric  evaluation.       ____________________________________________   FINAL CLINICAL IMPRESSION(S) / ED DIAGNOSES  Final diagnoses:  Schizophrenia, unspecified type (HCC)      NEW MEDICATIONS STARTED DURING THIS VISIT:  This SmartLink is deprecated. Use AVSMEDLIST instead to display the medication list for a patient.   Note:  This document was prepared using Dragon voice recognition software and may include unintentional dictation errors.    Willy Eddy, MD 12/25/16 [phone]

## 2016-12-25 NOTE — ED Notes (Signed)
One bag of patient belongings brought to hospital by law enforcement.  Belongings in clear bag.  Labeled with patient's name.

## 2016-12-25 NOTE — ED Notes (Signed)
Dressed out by this [employer] fund[place]. Pt had jail clothing on that is not his. Patient has no belongings.

## 2016-12-25 NOTE — ED Notes (Signed)
Pt legal guardian called, spoke with Cassandra at listed number in demographics.

## 2016-12-26 ENCOUNTER — Emergency Department: Payer: Medicare Other

## 2016-12-26 DIAGNOSIS — F203 Undifferentiated schizophrenia: Secondary | ICD-10-CM

## 2016-12-26 DIAGNOSIS — J449 Chronic obstructive pulmonary disease, unspecified: Secondary | ICD-10-CM

## 2016-12-26 DIAGNOSIS — F209 Schizophrenia, unspecified: Secondary | ICD-10-CM | POA: Diagnosis not present

## 2016-12-26 LAB — COMPREHENSIVE METABOLIC PANEL
ALBUMIN: 4.4 g/dL (ref 3.5–5.0)
ALK PHOS: 48 U/L (ref 38–126)
ALT: 14 U/L — ABNORMAL LOW (ref 17–63)
ANION GAP: 11 (ref 5–15)
AST: 21 U/L (ref 15–41)
BUN: 20 mg/dL (ref 6–20)
CALCIUM: 9.3 mg/dL (ref 8.9–10.3)
CHLORIDE: 104 mmol/L (ref 101–111)
CO2: 21 mmol/L — AB (ref 22–32)
Creatinine, Ser: 0.95 mg/dL (ref 0.61–1.24)
GFR calc non Af Amer: >60 mL/min (ref >60)
Glucose, Bld: 180 mg/dL — ABNORMAL HIGH (ref 65–99)
POTASSIUM: 3.6 mmol/L (ref 3.5–5.1)
SODIUM: 136 mmol/L (ref 135–145)
Total Bilirubin: 0.9 mg/dL (ref 0.3–1.2)
Total Protein: 6.8 g/dL (ref 6.5–8.1)

## 2016-12-26 LAB — CBC WITH DIFFERENTIAL/PLATELET
Basophils Absolute: 0 10*3/uL (ref 0–0.1)
Basophils Relative: 0 %
EOS ABS: 0 10*3/uL (ref 0–0.7)
EOS PCT: 0 %
HCT: 40.5 % (ref 40.0–52.0)
Hemoglobin: 13.9 g/dL (ref 13.0–18.0)
LYMPHS ABS: 0.7 10*3/uL — AB (ref 1.0–3.6)
Lymphocytes Relative: 14 %
MCH: 31 pg (ref 26.0–34.0)
MCHC: 34.2 g/dL (ref 32.0–36.0)
MCV: 90.6 fL (ref 80.0–100.0)
MONO ABS: 0.3 10*3/uL (ref 0.2–1.0)
MONOS PCT: 6 %
Neutro Abs: 3.6 10*3/uL (ref 1.4–6.5)
Neutrophils Relative %: 80 %
PLATELETS: 241 10*3/uL (ref 150–440)
RBC: 4.47 MIL/uL (ref 4.40–5.90)
RDW: 13.4 % (ref 11.5–14.5)
WBC: 4.6 10*3/uL (ref 3.8–10.6)

## 2016-12-26 LAB — ACETAMINOPHEN LEVEL: Acetaminophen (Tylenol), Serum: <10 ug/mL — ABNORMAL LOW (ref 10–30)

## 2016-12-26 LAB — SALICYLATE LEVEL

## 2016-12-26 LAB — TROPONIN I

## 2016-12-26 MED ORDER — DOCUSATE SODIUM 100 MG PO CAPS
100.0000 mg | ORAL_CAPSULE | Freq: Two times a day (BID) | ORAL | Status: DC
  Administered 2016-12-26 – 2017-01-03 (×17): 100 mg via ORAL
  Filled 2016-12-26 (×24): qty 1

## 2016-12-26 MED ORDER — CLOZAPINE 100 MG PO TABS
100.0000 mg | ORAL_TABLET | Freq: Every day | ORAL | Status: DC
  Administered 2016-12-26: 100 mg via ORAL
  Filled 2016-12-26: qty 1

## 2016-12-26 MED ORDER — SODIUM CHLORIDE 0.9 % IV BOLUS (SEPSIS)
1000.0000 mL | Freq: Once | INTRAVENOUS | Status: AC
  Administered 2016-12-26: 1000 mL via INTRAVENOUS

## 2016-12-26 MED ORDER — DIVALPROEX SODIUM 500 MG PO DR TAB
500.0000 mg | DELAYED_RELEASE_TABLET | Freq: Two times a day (BID) | ORAL | Status: DC
  Administered 2016-12-26 – 2017-01-03 (×17): 500 mg via ORAL
  Filled 2016-12-26 (×17): qty 1

## 2016-12-26 MED ORDER — AMMONIA AROMATIC IN INHA
RESPIRATORY_TRACT | Status: AC
  Filled 2016-12-26: qty 10

## 2016-12-26 MED ORDER — MOMETASONE FURO-FORMOTEROL FUM 200-5 MCG/ACT IN AERO
2.0000 | INHALATION_SPRAY | Freq: Two times a day (BID) | RESPIRATORY_TRACT | Status: DC
  Administered 2016-12-26 – 2017-01-03 (×16): 2 via RESPIRATORY_TRACT
  Filled 2016-12-26: qty 8.8

## 2016-12-26 MED ORDER — SENNOSIDES-DOCUSATE SODIUM 8.6-50 MG PO TABS
1.0000 | ORAL_TABLET | Freq: Every day | ORAL | Status: DC
Start: 2016-12-26 — End: 2017-01-03
  Administered 2016-12-26 – 2017-01-02 (×8): 1 via ORAL
  Filled 2016-12-26 (×8): qty 1

## 2016-12-26 NOTE — ED Notes (Signed)
Pt took PO meds with ease. Pt up to bathroom and would not cooperative to given urine sample.

## 2016-12-26 NOTE — ED Provider Notes (Addendum)
-----------------------------------------   7:28 AM on 12/26/2016 -----------------------------------------  Signed out to me at this time, patient brought over from behavioral health unit with decreased mentation and low blood pressure after taking medications he is not beenon for some time.  CT of the head and blood work is pending.  Patient is somnolent but arousable, pupils are small.  Per report, he has been in jail since October which makes smuggling narcotics less likely although certainly some ingestion could have caused this.  Presumed diagnosis however is from taking all of his psych meds at once.  We are awaiting results of CT head per sign out and blood work and I will continue to observe the patient here.  He is guarding his airway and his sats are good pressure is coming up with fluids.   Jeanmarie PlantMcShane, Aireal Slater A, MD 12/26/16 0730  ----------------------------------------- 8:15 AM on 12/26/2016 -----------------------------------------  Patient is having slightly more awake although still sleeping, blood pressure responding very well to fluids, not tachycardic heart rate in the 90s pressures in the 110s, we will continue to observe the patient, CT scan is reassuring blood work is reassuring thus far  ----------------------------------------- 9:24 AM on 12/26/2016 -----------------------------------------  Signs remained stable at this time, he is still somewhat but now more easily aroused to verbal stimuli, CT negative chest x-ray reassuring blood work reassuring we will continue to monitor closely  ----------------------------------------- 1:14 PM on 12/26/2016 -----------------------------------------  She did have a rapid recovery back to baseline we will continue with his medications after discussion with psychiatry, patient in no acute distress at this time without his medications obviously he decompensates, so it is a difficult thing to titrate.  We are watching his pressure and  his mental status closely here.  No other source there are signs or symptoms noted    Jeanmarie PlantMcShane, Danyia Borunda A, MD 12/26/16 [id number]    Jeanmarie PlantMcShane, Ammarie Matsuura A, MD 12/26/16 [id number]    Jeanmarie PlantMcShane, [name] A, MD 12/26/16 [id number]

## 2016-12-26 NOTE — ED Notes (Signed)
[facility] and pressure low and heart rate high.

## 2016-12-26 NOTE — Progress Notes (Signed)
MEDICATION RELATED CONSULT NOTE - INITIAL   Pharmacy Consult for Clozapine monitoring Indication: Schizophrenia  No Known Allergies  Patient Measurements: Height: 6\' 3"  (190.5 cm) Weight: 250 lb (113.4 kg) IBW/kg (Calculated) : 84.5   Labs: Recent Labs    12/25/16 1549 12/26/16 0655  WBC 8.7 4.6  HGB 14.8 13.9  HCT 43.2 40.5  PLT 292 241  CREATININE 0.76  --   ALBUMIN 4.4  --   PROT 6.8  --   AST 16  --   ALT 16*  --   ALKPHOS 49  --   BILITOT 0.7  --    Estimated Creatinine Clearance: 141.8 mL/min (by C-G formula based on SCr of 0.76 mg/dL).   Medical History: Past Medical History:  Diagnosis Date  . Hypertension   . Schizophrenia (HCC)     12/26/16 ANC 3600.  Labs submitted to Clozapine [facility] registry.   Next labs due 01/02/17.   Keaghan Bowens K, RPH 12/26/2016,7:29 AM

## 2016-12-26 NOTE — ED Notes (Addendum)
Pt wiped down with warm bath cloths; pt alert to touch. Offered tolieting, denied. Bed linens change. Pt alert, moving all extremities, yelling at RN when moving patient in bed. Pt back to sleep at this time. Pt offered breakfast, denies

## 2016-12-26 NOTE — BH Assessment (Signed)
Assessment Note  Jeffrey [name] is an 56 y.o. male presenting to the ED via police custody from jail after being released from jail.  Patient reportedly had not been on his medications during this time.  Patient has reportedly become more confused and paranoid.  Pt is not completely coherent and unable to participate in this assessment.  Diagnosis: Schizophrenia  Past Medical History:  Past Medical History:  Diagnosis Date  . Hypertension   . Schizophrenia [facility](HCC)     Past Surgical History:  Procedure Laterality Date  . 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  Social History:  reports that he has been smoking cigarettes.  He has been smoking about 1.00 pack per day. He uses smokeless tobacco. He reports that he does not drink alcohol or use drugs.  Additional Social History:     CIWA: CIWA-Ar BP: 92/62 Pulse Rate: 75 COWS:    Allergies: No Known Allergies  Home Medications:  (Not in a hospital admission)  OB/GYN Status:  No LMP for male patient.  General Assessment Data Assessment unable to be completed: Yes Reason for not completing assessment: altered mental state [facility] Assessment: In system Marital status: Single Maiden name: n/a Is patient pregnant?: No Pregnancy Status: No Living Arrangements: Group Home Can pt return to current living arrangement?: Yes Admission Status: Involuntary Is patient capable of signing voluntary admission?: No Referral Source: Self/Family/Friend Insurance type: Medicare     Crisis Care Plan Living Arrangements: Group Home Legal Guardian: Other:(empowering Lives Guardianship) Name of Psychiatrist: none Name of Therapist: none  Education Status Is patient currently in school?: No Current Grade: na Highest grade of school patient has completed: na Name of school: na Contact person: na  Risk to self with the past 6  months Suicidal Ideation: No Has patient been a risk to self within the past 6 months prior to admission? : No Suicidal Intent: No Has patient had any suicidal intent within the past 6 months prior to admission? : No Is patient at risk for suicide?: No Suicidal Plan?: No Has patient had any suicidal plan within the past 6 months prior to admission? : No Access to Means: No Previous Attempts/Gestures: No Triggers for Past Attempts: Unknown Intentional Self Injurious Behavior: None Family Suicide History: Unknown Recent stressful life event(s): Other (Comment) Persecutory voices/beliefs?: No Depression: No Substance abuse history and/or treatment for substance abuse?: No Suicide prevention information given to non-admitted patients: Not applicable  Risk to Others within the past 6 months Homicidal Ideation: No Does patient have any lifetime risk of violence toward others beyond the six months prior to admission? : No Thoughts of Harm to Others: No Current Homicidal Intent: No Current Homicidal Plan: No Access to Homicidal Means: No History of harm to others?: No Assessment of Violence: None Noted Does patient have access to weapons?: No Criminal Charges Pending?: No Does patient have a court date: No Is patient on probation?: Unknown  Psychosis Hallucinations: None noted Delusions: None noted  Mental Status Report Appearance/Hygiene: Disheveled Eye Contact: Poor Motor Activity: Unremarkable Speech: Incoherent Level of Consciousness: Drowsy, Sedated Mood: Other (Comment) Affect: Unable to Assess Anxiety Level: None Thought Processes: Unable to Assess Judgement: Unable to Assess Orientation: Unable to assess Obsessive Compulsive Thoughts/Behaviors: Unable to Assess  Cognitive Functioning Concentration: Unable to Assess Memory: Unable to Assess IQ: Average Insight: Unable to Assess Impulse Control: Unable  to Assess Sleep: Unable to Assess Vegetative Symptoms:  Unable to Assess  ADLScreening [facility] Services) Patient's cognitive ability adequate to safely complete daily activities?: Yes Patient able to express need for assistance with ADLs?: Yes Independently performs ADLs?: Yes (appropriate for developmental age)  Prior Inpatient Therapy Prior Inpatient Therapy: Yes  Prior Outpatient Therapy Prior Outpatient Therapy: Yes Does patient have an ACCT team?: Unknown Does patient have Intensive In-House Services?  : No Does patient have [facility] services? : Unknown Does patient have P4CC services?: Unknown  ADL Screening (condition at time of admission) Patient's cognitive ability adequate to safely complete daily activities?: Yes Patient able to express need for assistance with ADLs?: Yes Independently performs ADLs?: Yes (appropriate for developmental age)       Abuse/Neglect Assessment (Assessment to be complete while patient is alone) Abuse/Neglect Assessment Can Be Completed: Unable to assess, patient is non-responsive or altered mental status Values / Beliefs Cultural Requests During Hospitalization: None Spiritual Requests During Hospitalization: None Consults Spiritual Care Consult Needed: No Social Work Consult Needed: No      Additional Information 1:1 In Past 12 Months?: Yes CIRT Risk: Yes Elopement Risk: Yes Does patient have medical clearance?: Yes     Disposition:  Disposition Initial Assessment Completed for this Encounter: Yes Disposition of Patient: Pending Review with psychiatrist Other disposition(s): Other (Comment)  On Site Evaluation by:   Reviewed with Physician:    Artist Beachoxana C Eowyn Tabone 12/26/2016 3:44 AM

## 2016-12-26 NOTE — ED Provider Notes (Addendum)
-----------------------------------------   6:35 AM on 12/26/2016 -----------------------------------------  [facility] was standing in the hallway, unresponsive, drooling.  He was tachycardic and hypotensive with systolic in the 90s.  He was brought back to the main ED where he had an excellent response to an ammonia inhalant.  Will recheck screening lab work, infuse IV fluids and obtain CT head.  On examination, after awakening for ammonia inhalant, patient fell back asleep.  He is quite somnolent but arousable to painful stimuli.  My understanding is that patient was just released from jail, had not been taking his medications.  He received nighttime doses of Depakote, Clozaril and fluoxetine last evening.  Most likely patient's current response is secondary to heavy sedation from medications.  ED ECG REPORT I, SUNG,JADE J, the attending physician, personally viewed and interpreted this ECG.   Date: 12/26/2016  EKG Time: 0652  Rate: 121  Rhythm: sinus tachycardia  Axis: Normal  Intervals:none  ST&T Change: Nonspecific  Care transferred to oncoming provider pending laboratory and x-ray results, and reassessment.    Irean HongSung, Jade J, MD 12/26/16 [phone]

## 2016-12-26 NOTE — ED Notes (Signed)
Pt arrived on unit via officer and nurse. Pt is still drowsy with some drooling from mouth. Pt able to follow command. Will cont to monitor pt.

## 2016-12-26 NOTE — ED Notes (Signed)
Pt. In Dayroom area.  Pt. Watching tv while dancing.  Pt. Denies SI/HI and A/V H.  Pt. Able to engage when spoken too.  Pt. Having racing thoughts, but is being cooperative at this time.

## 2016-12-26 NOTE — Consult Note (Signed)
[place] [facility] Consult   Reason for Consult: Consult for 56 year old man with schizophrenia brought here from jail Referring Physician: Burlene Arnt Patient Identification: Jeffrey [name] MRN:  [id number] Principal Diagnosis: Schizophrenia, undifferentiated ([place]) Diagnosis:   Patient Active Problem List   Diagnosis Date Noted  . COPD (chronic obstructive pulmonary disease) ([place]) [J44.9] 12/26/2016  . Dyslipidemia [E78.5] 05/27/2016  . Constipation [K59.00] 05/27/2016  . Schizophrenia, undifferentiated ([place]) [F20.3] 05/27/2016  . Noncompliance [Z91.19] 02/15/2016  . Tobacco use disorder [F17.200] 06/30/2014  . Hypertension [I10] 06/29/2014    Total Time spent with patient: 1 hour  Subjective:   Jeffrey [name] is a 56 y.o. male patient admitted with "I do not know".  HPI: Patient seen chart reviewed patient well known from previous encounters.  56 year old man with schizophrenia who was brought here last night from the jail.  Apparently he had been in jail for several weeks and had been refusing medication.  His behavior became more and more disruptive and uncontrolled with reports of smearing feces etc.  Jail decided ultimately to drop charges and bring him to the emergency room.  He came with a judge's order to be brought to [facility] for treatment.  The patient himself is not able to give any lucid history.  Even at his best he is pretty disorganized with poor insight but currently he is extremely disorganized hostile often unresponsive.  No evidence of recent substance abuse.  Apparently he has been off his medicine probably for a few weeks.  Last night he was given his medicine at the full dose with clozapine 500 mg last night.  He developed an episode of low blood pressure seemed to become dizzy and semi-unresponsive.  I was not here to observe it but apparently there was enough concern that he was moved over to the medical side for a few hours.  That has all  cleared up and he seems to be back to his physical baseline.  Social history: Patient has a legal guardian in the community.  For many years his mother had looked out for him but she died earlier this year which has thrown him into a real tailspin.  Last time we saw Jeffrey [name] we had discharged him to a group home in October.  Judging from the notes it looks like that did not last long before he wound up at [facility] and then shortly thereafter in jail.  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  He has a history of high blood pressure but currently his blood pressure is running low normal.  Does not have a reported history of diabetes but his sugars are running high now.  History of COPD dyslipidemia  Substance abuse history: Fortunately this is one area that he does not have a problem in.  Past Psychiatric History: Patient has long-standing schizophrenia.  Multiple hospitalizations going back decades.  When psychotic his behavior becomes agitated and can be potentially dangerous to himself or others as shown by his self-inflicted amputation.  He had been maintained on a combination of clozapine and Depakote.  Even at full doses of medication he was still psychotic but his behavior became much better controlled.  Risk to Self: Suicidal Ideation: No Suicidal Intent: No Is patient at risk for suicide?: No Suicidal Plan?: No Access to Means: No Triggers for Past Attempts: Unknown Intentional Self Injurious Behavior: None Risk to Others: Homicidal Ideation: No  Thoughts of Harm to Others: No Current Homicidal Intent: No Current Homicidal Plan: No Access to Homicidal Means: No History of harm to others?: No Assessment of Violence: None Noted Does patient have access to weapons?: No Criminal Charges Pending?: No Does patient have a court date: No Prior Inpatient Therapy: Prior Inpatient  Therapy: Yes Prior Outpatient Therapy: Prior Outpatient Therapy: Yes Does patient have an ACCT team?: Unknown Does patient have Intensive In-House Services?  : No Does patient have [facility] services? : Unknown Does patient have P4CC services?: Unknown  Past Medical History:  Past Medical History:  Diagnosis Date  . Hypertension   . Schizophrenia [facility])     Past Surgical History:  Procedure Laterality Date  . 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 Family Psychiatric  History: Unknown Social History:  Social History   Substance and Sexual Activity  Alcohol Use No     Social History   Substance and Sexual Activity  Drug Use No    Social History   Socioeconomic History  . Marital status: Single    Spouse name: None  . Number of children: None  . Years of education: None  . Highest education level: None  Social Needs  . Financial resource strain: None  . Food insecurity - worry: None  . Food insecurity - inability: None  . Transportation needs - medical: None  . Transportation needs - non-medical: None  Occupational History  . None  Tobacco Use  . Smoking status: Current Every Day Smoker    Packs/day: 1.00    Types: Cigarettes  . Smokeless tobacco: Current User  Substance and Sexual Activity  . Alcohol use: No  . Drug use: No  . Sexual activity: None  Other Topics Concern  . None  Social History Narrative  . None   Additional Social History:    Allergies:  No Known Allergies  Labs:  Results for orders placed or performed during the hospital encounter of 12/25/16 (from the past 48 hour(s))  Comprehensive metabolic panel     Status: Abnormal   Collection Time: 12/25/16  3:49 PM  Result Value Ref Range   Sodium 137 135 - 145 mmol/L   Potassium 3.8 3.5 - 5.1 mmol/L   Chloride 105 101 - 111 mmol/L   CO2 20 (L) 22 - 32 mmol/L   Glucose, Bld 117 (H) 65 - 99 mg/dL   BUN 16 6 - 20 mg/dL     Creatinine, Ser 0.76 0.61 - 1.24 mg/dL   Calcium 9.6 8.9 - 10.3 mg/dL   Total Protein 6.8 6.5 - 8.1 g/dL   Albumin 4.4 3.5 - 5.0 g/dL   AST 16 15 - 41 U/L   ALT 16 (L) 17 - 63 U/L   Alkaline Phosphatase 49 38 - 126 U/L   Total Bilirubin 0.7 0.3 - 1.2 mg/dL   GFR calc non Af Amer >60 >60 mL/min   GFR calc Af Amer >60 >60 mL/min    Comment: (NOTE) The eGFR has been calculated using the CKD EPI equation. This calculation has not been validated in all clinical situations. eGFR's persistently <60 mL/min signify possible Chronic Kidney Disease.    Anion gap 12 5 - 15  Ethanol     Status: None   Collection Time: 12/25/16  3:49 PM  Result Value Ref Range   Alcohol, Ethyl (B) <10 <10 mg/dL    Comment:  LOWEST DETECTABLE LIMIT FOR SERUM ALCOHOL IS 10 mg/dL FOR MEDICAL PURPOSES ONLY   Salicylate level     Status: None   Collection Time: 12/25/16  3:49 PM  Result Value Ref Range   Salicylate Lvl <1.9 2.8 - 30.0 mg/dL  Acetaminophen level     Status: Abnormal   Collection Time: 12/25/16  3:49 PM  Result Value Ref Range   Acetaminophen (Tylenol), Serum <10 (L) 10 - 30 ug/mL    Comment:        THERAPEUTIC CONCENTRATIONS VARY SIGNIFICANTLY. A RANGE OF 10-30 ug/mL MAY BE AN EFFECTIVE CONCENTRATION FOR MANY PATIENTS. HOWEVER, SOME ARE BEST TREATED AT CONCENTRATIONS OUTSIDE THIS RANGE. ACETAMINOPHEN CONCENTRATIONS >150 ug/mL AT 4 HOURS AFTER INGESTION AND >50 ug/mL AT 12 HOURS AFTER INGESTION ARE OFTEN ASSOCIATED WITH TOXIC REACTIONS.   cbc     Status: None   Collection Time: 12/25/16  3:49 PM  Result Value Ref Range   WBC 8.7 3.8 - 10.6 K/uL   RBC 4.87 4.40 - 5.90 MIL/uL   Hemoglobin 14.8 13.0 - 18.0 g/dL   HCT 43.2 40.0 - 52.0 %   MCV 88.7 80.0 - 100.0 fL   MCH 30.4 26.0 - 34.0 pg   MCHC 34.3 32.0 - 36.0 g/dL   RDW 13.5 11.5 - 14.5 %   Platelets 292 150 - 440 K/uL  CBC with Differential     Status: Abnormal   Collection Time: 12/26/16  6:55 AM  Result Value Ref  Range   WBC 4.6 3.8 - 10.6 K/uL   RBC 4.47 4.40 - 5.90 MIL/uL   Hemoglobin 13.9 13.0 - 18.0 g/dL   HCT 40.5 40.0 - 52.0 %   MCV 90.6 80.0 - 100.0 fL   MCH 31.0 26.0 - 34.0 pg   MCHC 34.2 32.0 - 36.0 g/dL   RDW 13.4 11.5 - 14.5 %   Platelets 241 150 - 440 K/uL   Neutrophils Relative % 80 %   Neutro Abs 3.6 1.4 - 6.5 K/uL   Lymphocytes Relative 14 %   Lymphs Abs 0.7 (L) 1.0 - 3.6 K/uL   Monocytes Relative 6 %   Monocytes Absolute 0.3 0.2 - 1.0 K/uL   Eosinophils Relative 0 %   Eosinophils Absolute 0.0 0 - 0.7 K/uL   Basophils Relative 0 %   Basophils Absolute 0.0 0 - 0.1 K/uL  Comprehensive metabolic panel     Status: Abnormal   Collection Time: 12/26/16  6:55 AM  Result Value Ref Range   Sodium 136 135 - 145 mmol/L   Potassium 3.6 3.5 - 5.1 mmol/L   Chloride 104 101 - 111 mmol/L   CO2 21 (L) 22 - 32 mmol/L   Glucose, Bld 180 (H) 65 - 99 mg/dL   BUN 20 6 - 20 mg/dL   Creatinine, Ser 0.95 0.61 - 1.24 mg/dL   Calcium 9.3 8.9 - 10.3 mg/dL   Total Protein 6.8 6.5 - 8.1 g/dL   Albumin 4.4 3.5 - 5.0 g/dL   AST 21 15 - 41 U/L   ALT 14 (L) 17 - 63 U/L   Alkaline Phosphatase 48 38 - 126 U/L   Total Bilirubin 0.9 0.3 - 1.2 mg/dL   GFR calc non Af Amer >60 >60 mL/min   GFR calc Af Amer >60 >60 mL/min    Comment: (NOTE) The eGFR has been calculated using the CKD EPI equation. This calculation has not been validated in all clinical situations. eGFR's persistently <60 mL/min signify possible Chronic  Kidney Disease.    Anion gap 11 5 - 15  Acetaminophen level     Status: Abnormal   Collection Time: 12/26/16  6:55 AM  Result Value Ref Range   Acetaminophen (Tylenol), Serum <10 (L) 10 - 30 ug/mL    Comment:        THERAPEUTIC CONCENTRATIONS VARY SIGNIFICANTLY. A RANGE OF 10-30 ug/mL MAY BE AN EFFECTIVE CONCENTRATION FOR MANY PATIENTS. HOWEVER, SOME ARE BEST TREATED AT CONCENTRATIONS OUTSIDE THIS RANGE. ACETAMINOPHEN CONCENTRATIONS >150 ug/mL AT 4 HOURS AFTER INGESTION AND >50  ug/mL AT 12 HOURS AFTER INGESTION ARE OFTEN ASSOCIATED WITH TOXIC REACTIONS.   Salicylate level     Status: None   Collection Time: 12/26/16  6:55 AM  Result Value Ref Range   Salicylate Lvl <4.1 2.8 - 30.0 mg/dL  Troponin I     Status: None   Collection Time: 12/26/16  6:55 AM  Result Value Ref Range   Troponin I <0.03 <0.03 ng/mL    Current Facility-Administered Medications  Medication Dose Route Frequency Provider Last Rate Last Dose  . ammonia inhalant           . chlorproMAZINE (THORAZINE) tablet 100 mg  100 mg Oral QID PRN Merlyn Lot, MD   100 mg at 12/25/16 2229  . cloZAPine (CLOZARIL) tablet 100 mg  100 mg Oral Daily Merlyn Lot, MD   100 mg at 12/26/16 1130  . cloZAPine (CLOZARIL) tablet 100 mg  100 mg Oral QHS Nelsy Madonna T, MD      . divalproex (DEPAKOTE) DR tablet 500 mg  500 mg Oral Q12H Rodriquez Thorner T, MD      . docusate sodium (COLACE) capsule 100 mg  100 mg Oral BID Jaishon Krisher T, MD      . FLUoxetine (PROZAC) capsule 10 mg  10 mg Oral Daily Merlyn Lot, MD   10 mg at 12/26/16 1129  . mometasone-formoterol (DULERA) 200-5 MCG/ACT inhaler 2 puff  2 puff Inhalation BID Zavien Clubb T, MD      . senna-docusate (Senokot-S) tablet 1 tablet  1 tablet Oral QHS Sumi Lye T, MD      . temazepam (RESTORIL) capsule 7.5 mg  7.5 mg Oral QHS Merlyn Lot, MD   7.5 mg at 12/25/16 2229   Current Outpatient Medications  Medication Sig Dispense Refill  . cloZAPine (CLOZARIL) 100 MG tablet Take 1 tablet (100 mg total) by mouth daily. (Patient taking differently: Take 100 mg by mouth 2 (two) times daily. ) 30 tablet 0  . desmopressin (DDAVP) 0.2 MG tablet Take 1 tablet (0.2 mg total) by mouth at bedtime. 30 tablet 0  . divalproex (DEPAKOTE) 500 MG DR tablet Take 2 tablets (1,000 mg total) by mouth every 12 (twelve) hours. (Patient taking differently: Take 1,000 mg by mouth at bedtime. ) 120 tablet 0  . docusate sodium (COLACE) 100 MG capsule Take 2  capsules (200 mg total) by mouth 2 (two) times daily. 120 capsule 0  . ipratropium (ATROVENT) 0.03 % nasal spray Place 2 sprays into both nostrils at bedtime.    . metformin (FORTAMET) 500 MG (OSM) 24 hr tablet Take 500 mg by mouth daily with breakfast.    . metoprolol tartrate (LOPRESSOR) 25 MG tablet Take 12.5 mg by mouth 2 (two) times daily.    . polyethylene glycol (MIRALAX / GLYCOLAX) packet Take 17 g by mouth daily.    Marland Kitchen senna (SENOKOT) 8.6 MG TABS tablet Take 2 tablets (17.2 mg total) by mouth at bedtime.  60 tablet 0  . simvastatin (ZOCOR) 40 MG tablet Take 1 tablet (40 mg total) by mouth daily at 6 PM. 30 tablet 0  . temazepam (RESTORIL) 7.5 MG capsule Take 1 capsule (7.5 mg total) by mouth at bedtime. (Patient taking differently: Take 15 mg by mouth at bedtime. ) 30 capsule 0  . chlorproMAZINE (THORAZINE) 100 MG tablet Take 1 tablet (100 mg total) by mouth 4 (four) times daily as needed (Agitation). (Patient not taking: Reported on 10/31/2016) 120 tablet 0  . cloZAPine (CLOZARIL) 200 MG tablet Take 2 tablets (400 mg total) by mouth at bedtime. (Patient not taking: Reported on 10/31/2016) 60 tablet 0  . FLUoxetine (PROZAC) 10 MG capsule Take 1 capsule (10 mg total) by mouth daily. (Patient not taking: Reported on 10/31/2016) 30 capsule 0  . mometasone-formoterol (DULERA) 200-5 MCG/ACT AERO Inhale 2 puffs into the lungs 2 (two) times daily. (Patient not taking: Reported on 10/31/2016) 2 Inhaler 0    Musculoskeletal: Strength & Muscle Tone: within normal limits Gait & Station: normal Patient leans: N/A  Psychiatric Specialty Exam: Physical Exam  Nursing note and vitals reviewed. Constitutional: He appears well-developed and well-nourished.  HENT:  Head: Normocephalic and atraumatic.  Eyes: Conjunctivae are normal. Pupils are equal, round, and reactive to light.  Neck: Normal range of motion.  Cardiovascular: Regular rhythm and normal heart sounds.  Respiratory: Effort normal. No  respiratory distress.  GI: Soft.  Musculoskeletal: Normal range of motion.       Arms: Neurological: He is alert.  Skin: Skin is warm and dry.  Psychiatric: His affect is labile and inappropriate. His speech is tangential and slurred. He is agitated. Thought content is paranoid. Cognition and memory are impaired. He expresses impulsivity and inappropriate judgment. He expresses no homicidal and no suicidal ideation. He is noncommunicative. He exhibits abnormal recent memory and abnormal remote memory.    Review of Systems  Unable to perform ROS: Psychiatric disorder    Blood pressure 110/63, pulse 85, temperature 97.6 F (36.4 C), temperature source Oral, resp. rate 17, height _0  (1.905 m), weight 113.4 kg (250 lb), SpO2 100 %.Body mass index is 31.25 kg/m.  General Appearance: Disheveled  Eye Contact:  Minimal  Speech:  Garbled  Volume:  Decreased  Mood:  Irritable  Affect:  Inappropriate and Labile  Thought Process:  Disorganized  Orientation:  Negative  Thought Content:  Delusions, Hallucinations: Auditory Visual and Paranoid Ideation  Suicidal Thoughts:  No  Homicidal Thoughts:  Yes.  without intent/plan  Memory:  Immediate;   Fair Recent;   Poor Remote;   Poor  Judgement:  Impaired  Insight:  Lacking  Psychomotor Activity:  Restlessness  Concentration:  Concentration: Poor  Recall:  Poor  Fund of Knowledge:  Fair  Language:  Fair  Akathisia:  No  Handed:  Right  AIMS (if indicated):     Assets:  Financial Resources/Insurance Social Support  ADL's:  Impaired  Cognition:  Impaired,  Moderate  Sleep:        Treatment Plan Summary: Medication management and Plan 56 year old man with schizophrenia who presents very disorganized agitated confused with chaotic behavior.  Clearly psychotic.  Clearly dangerous to himself without close monitoring.  Patient was restarted on full dose because of pain which is probably too much since he has been off his medicine for a  while.  I am going to cut the nighttime dose down to 100 and then we will gradually increase it back up.  Depakote will  be continued at his previous 500 twice a day.  PRN Thorazine is available which has been helpful in the past.  I have already spoken to his guardian and also spoken to a representative from [employer].  Everyone is in agreement that the best treatment plan for him would be transfer to [facility] for longer-term treatment.  Multiple attempts at discharge to group homes have failed and are just dangerous to the patient in the community because of his history of aggression.  The patient would be better managed in a controlled psychiatric setting for a much longer period of time.  We have tried to refer [facility] to [facility] in the recent past and not been successful.  I have it from the guardian and the representative from [place] that they will try to be supportive of this plan.  TTS and social work can work on the referral.  We will continue his medicines for now with close monitoring.  Disposition: See note above.  Patient is something of a special case.  Clearly needs inpatient level treatment but also is not really appropriate for a short-term unit such as ours because of the severity and chronicity of his illness.  We are for now going to manage him in the emergency room while working on possible transfer to longer term facility.  Alethia Berthold, MD 12/26/2016 3:04 PM

## 2016-12-26 NOTE — ED Notes (Signed)
Spoke with Dr. Alphonzo LemmingsMcShane, states that pt should be watched and have [facility]als [facility] sure BP stays normal.

## 2016-12-26 NOTE — ED Notes (Signed)
Pt. Pacing in dayroom and trying to interact with characters on tv.

## 2016-12-26 NOTE — ED Notes (Signed)
Report to [facility], Charity fundraiserN. This RN [facility]

## 2016-12-26 NOTE — ED Notes (Signed)
Pt ripped shirt. Pt on the floor posing as a football player. Pt began to pace back and forth rapidly. Pt then began yelling out and talking to wall. Due to increase agitation 100mg  of Thorazine prn was given to patient. Will cont to monitor pt.

## 2016-12-26 NOTE — ED Notes (Addendum)
Attempted to wake patient to take meds, Pt arousable to physical stimuli. Pt refusing to take meds, swatting at this RN hands. Will attempt to given at lunch time when food is here for patient.

## 2016-12-26 NOTE — ED Notes (Signed)
Sherron MondayStacey Skradski, Guardian Representative from [employer]- came by to get update on patient. States that [employer] is involved in patient case. Awaiting psychiatric evaluation.

## 2016-12-27 DIAGNOSIS — F203 Undifferentiated schizophrenia: Secondary | ICD-10-CM | POA: Diagnosis not present

## 2016-12-27 DIAGNOSIS — F209 Schizophrenia, unspecified: Secondary | ICD-10-CM | POA: Diagnosis not present

## 2016-12-27 MED ORDER — CLOZAPINE 100 MG PO TABS
150.0000 mg | ORAL_TABLET | Freq: Every day | ORAL | Status: DC
  Administered 2016-12-27 – 2017-01-02 (×7): 150 mg via ORAL
  Filled 2016-12-27 (×7): qty 2

## 2016-12-27 NOTE — ED Notes (Signed)
Patient did eat 100% of lunch, no signs of distress, Patient went back to sleep, will continue to monitor.

## 2016-12-27 NOTE — ED Notes (Signed)
Patient is cooperative, no signs of distress, Patient did ask for snack, nurse did gave him crackers and drink, patient is safe.

## 2016-12-27 NOTE — Consult Note (Signed)
[facility] Face-to-Face Psychiatry Consult   Reason for Consult: Follow-up follow-up 56-year-old man with schizophrenia who is in the hospital after being brought here from the jail Referring Physician: Forbach Patient Identification: Jeffrey [name] MRN:  [id number] Principal Diagnosis: Schizophrenia, undifferentiated (HCC) Diagnosis:   Patient Active Problem List   Diagnosis Date Noted  . COPD (chronic obstructive pulmonary disease) (HCC) [J44.9] 12/26/2016  . Dyslipidemia [E78.5] 05/27/2016  . Constipation [K59.00] 05/27/2016  . Schizophrenia, undifferentiated (HCC) [F20.3] 05/27/2016  . Noncompliance [Z91.19] 02/15/2016  . Tobacco use disorder [F17.200] 06/30/2014  . Hypertension [I10] 06/29/2014    Total Time spent with patient: 30 minutes  Subjective:   Jeffrey [name] is a 56 y.o. male patient admitted with patient not able to give any coherent answer.  HPI: Follow-up for this 56-year-old man.  See previous notes.  [name] has schizophrenia with a disorganized presentation.  He had been in jail recently and had been off of his medicine resulting is illness becoming even more severe.  He was brought back to the emergency room and is now free from legal charges but is very psychotic.  Patient has calm down a little bit.  He has not been violent or threatening here today.  He is cooperative with medication.  Not able to give any coherent information he remains labile and very disorganized in his thought.  Past Psychiatric History: Long-standing mental illness which has been resistant and remains severe even with aggressive medication treatment.  Multiple hospitalizations.  Never really been able to function very well outside of the facility particularly not since his mother died.  Risk to Self: Suicidal Ideation: No Suicidal Intent: No Is patient at risk for suicide?: No Suicidal Plan?: No Access to Means: No Triggers for Past Attempts: Unknown Intentional Self Injurious Behavior:  None Risk to Others: Homicidal Ideation: No Thoughts of Harm to Others: No Current Homicidal Intent: No Current Homicidal Plan: No Access to Homicidal Means: No History of harm to others?: No Assessment of Violence: None Noted Does patient have access to weapons?: No Criminal Charges Pending?: No Does patient have a court date: No Prior Inpatient Therapy: Prior Inpatient Therapy: Yes Prior Outpatient Therapy: Prior Outpatient Therapy: Yes Does patient have an ACCT team?: Unknown Does patient have Intensive In-House Services?  : No Does patient have [facility] services? : Unknown Does patient have [facility] services?: Unknown  Past Medical History:  Past Medical History:  Diagnosis Date  . Hypertension   . Schizophrenia (HCC)     Past Surgical History:  Procedure Laterality Date  . 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 Family Psychiatric  History: Unknown Social History:  Social History   Substance and Sexual Activity  Alcohol Use No     Social History   Substance and Sexual Activity  Drug Use No    Social History   Socioeconomic History  . Marital status: Single    Spouse name: None  . Number of children: None  . Years of education: None  . Highest education level: None  Social Needs  . Financial resource strain: None  . Food insecurity - worry: None  . Food insecurity - inability: None  . Transportation needs - medical: None  . Transportation needs - non-medical: None  Occupational History  . None  Tobacco Use  . Smoking status: Current Every Day Smoker    Packs/day: 1.00    Types: Cigarettes  .   Smokeless tobacco: Current User  Substance and Sexual Activity  . Alcohol use: No  . Drug use: No  . Sexual activity: None  Other Topics Concern  . None  Social History Narrative  . None   Additional Social History:    Allergies:  No Known Allergies  Labs:  Results for orders placed or  performed during the hospital encounter of 12/25/16 (from the past 48 hour(s))  Comprehensive metabolic panel     Status: Abnormal   Collection Time: 12/25/16  3:49 PM  Result Value Ref Range   Sodium 137 135 - 145 mmol/L   Potassium 3.8 3.5 - 5.1 mmol/L   Chloride 105 101 - 111 mmol/L   CO2 20 (L) 22 - 32 mmol/L   Glucose, Bld 117 (H) 65 - 99 mg/dL   BUN 16 6 - 20 mg/dL   Creatinine, Ser 0.76 0.61 - 1.24 mg/dL   Calcium 9.6 8.9 - 10.3 mg/dL   Total Protein 6.8 6.5 - 8.1 g/dL   Albumin 4.4 3.5 - 5.0 g/dL   AST 16 15 - 41 U/L   ALT 16 (L) 17 - 63 U/L   Alkaline Phosphatase 49 38 - 126 U/L   Total Bilirubin 0.7 0.3 - 1.2 mg/dL   GFR calc non Af Amer >60 >60 mL/min   GFR calc Af Amer >60 >60 mL/min    Comment: (NOTE) The eGFR has been calculated using the CKD EPI equation. This calculation has not been validated in all clinical situations. eGFR's persistently <60 mL/min signify possible Chronic Kidney Disease.    Anion gap 12 5 - 15  Ethanol     Status: None   Collection Time: 12/25/16  3:49 PM  Result Value Ref Range   Alcohol, Ethyl (B) <10 <10 mg/dL    Comment:        LOWEST DETECTABLE LIMIT FOR SERUM ALCOHOL IS 10 mg/dL FOR MEDICAL PURPOSES ONLY   Salicylate level     Status: None   Collection Time: 12/25/16  3:49 PM  Result Value Ref Range   Salicylate Lvl <7.0 2.8 - 30.0 mg/dL  Acetaminophen level     Status: Abnormal   Collection Time: 12/25/16  3:49 PM  Result Value Ref Range   Acetaminophen (Tylenol), Serum <10 (L) 10 - 30 ug/mL    Comment:        THERAPEUTIC CONCENTRATIONS VARY SIGNIFICANTLY. A RANGE OF 10-30 ug/mL MAY BE AN EFFECTIVE CONCENTRATION FOR MANY PATIENTS. HOWEVER, SOME ARE BEST TREATED AT CONCENTRATIONS OUTSIDE THIS RANGE. ACETAMINOPHEN CONCENTRATIONS >150 ug/mL AT 4 HOURS AFTER INGESTION AND >50 ug/mL AT 12 HOURS AFTER INGESTION ARE OFTEN ASSOCIATED WITH TOXIC REACTIONS.   cbc     Status: None   Collection Time: 12/25/16  3:49 PM   Result Value Ref Range   WBC 8.7 3.8 - 10.6 K/uL   RBC 4.87 4.40 - 5.90 MIL/uL   Hemoglobin 14.8 13.0 - 18.0 g/dL   HCT 43.2 40.0 - 52.0 %   MCV 88.7 80.0 - 100.0 fL   MCH 30.4 26.0 - 34.0 pg   MCHC 34.3 32.0 - 36.0 g/dL   RDW 13.5 11.5 - 14.5 %   Platelets 292 150 - 440 K/uL  CBC with Differential     Status: Abnormal   Collection Time: 12/26/16  6:55 AM  Result Value Ref Range   WBC 4.6 3.8 - 10.6 K/uL   RBC 4.47 4.40 - 5.90 MIL/uL   Hemoglobin 13.9 13.0 - 18.0 g/dL     HCT 40.5 40.0 - 52.0 %   MCV 90.6 80.0 - 100.0 fL   MCH 31.0 26.0 - 34.0 pg   MCHC 34.2 32.0 - 36.0 g/dL   RDW 13.4 11.5 - 14.5 %   Platelets 241 150 - 440 K/uL   Neutrophils Relative % 80 %   Neutro Abs 3.6 1.4 - 6.5 K/uL   Lymphocytes Relative 14 %   Lymphs Abs 0.7 (L) 1.0 - 3.6 K/uL   Monocytes Relative 6 %   Monocytes Absolute 0.3 0.2 - 1.0 K/uL   Eosinophils Relative 0 %   Eosinophils Absolute 0.0 0 - 0.7 K/uL   Basophils Relative 0 %   Basophils Absolute 0.0 0 - 0.1 K/uL  Comprehensive metabolic panel     Status: Abnormal   Collection Time: 12/26/16  6:55 AM  Result Value Ref Range   Sodium 136 135 - 145 mmol/L   Potassium 3.6 3.5 - 5.1 mmol/L   Chloride 104 101 - 111 mmol/L   CO2 21 (L) 22 - 32 mmol/L   Glucose, Bld 180 (H) 65 - 99 mg/dL   BUN 20 6 - 20 mg/dL   Creatinine, Ser 0.95 0.61 - 1.24 mg/dL   Calcium 9.3 8.9 - 10.3 mg/dL   Total Protein 6.8 6.5 - 8.1 g/dL   Albumin 4.4 3.5 - 5.0 g/dL   AST 21 15 - 41 U/L   ALT 14 (L) 17 - 63 U/L   Alkaline Phosphatase 48 38 - 126 U/L   Total Bilirubin 0.9 0.3 - 1.2 mg/dL   GFR calc non Af Amer >60 >60 mL/min   GFR calc Af Amer >60 >60 mL/min    Comment: (NOTE) The eGFR has been calculated using the CKD EPI equation. This calculation has not been validated in all clinical situations. eGFR's persistently <60 mL/min signify possible Chronic Kidney Disease.    Anion gap 11 5 - 15  Acetaminophen level     Status: Abnormal   Collection Time:  12/26/16  6:55 AM  Result Value Ref Range   Acetaminophen (Tylenol), Serum <10 (L) 10 - 30 ug/mL    Comment:        THERAPEUTIC CONCENTRATIONS VARY SIGNIFICANTLY. A RANGE OF 10-30 ug/mL MAY BE AN EFFECTIVE CONCENTRATION FOR MANY PATIENTS. HOWEVER, SOME ARE BEST TREATED AT CONCENTRATIONS OUTSIDE THIS RANGE. ACETAMINOPHEN CONCENTRATIONS >150 ug/mL AT 4 HOURS AFTER INGESTION AND >50 ug/mL AT 12 HOURS AFTER INGESTION ARE OFTEN ASSOCIATED WITH TOXIC REACTIONS.   Salicylate level     Status: None   Collection Time: 12/26/16  6:55 AM  Result Value Ref Range   Salicylate Lvl <7.0 2.8 - 30.0 mg/dL  Troponin I     Status: None   Collection Time: 12/26/16  6:55 AM  Result Value Ref Range   Troponin I <0.03 <0.03 ng/mL    Current Facility-Administered Medications  Medication Dose Route Frequency Provider Last Rate Last Dose  . chlorproMAZINE (THORAZINE) tablet 100 mg  100 mg Oral QID PRN Robinson, Patrick, MD   100 mg at 12/26/16 1738  . cloZAPine (CLOZARIL) tablet 100 mg  100 mg Oral Daily Robinson, Patrick, MD   100 mg at 12/27/16 0856  . cloZAPine (CLOZARIL) tablet 150 mg  150 mg Oral QHS Clapacs, John T, MD      . divalproex (DEPAKOTE) DR tablet 500 mg  500 mg Oral Q12H Clapacs, John T, MD   500 mg at 12/27/16 0840  . docusate sodium (COLACE) capsule 100 mg  100 mg   Oral BID Clapacs, John T, MD   100 mg at 12/27/16 0838  . FLUoxetine (PROZAC) capsule 10 mg  10 mg Oral Daily Robinson, Patrick, MD   10 mg at 12/27/16 0841  . mometasone-formoterol (DULERA) 200-5 MCG/ACT inhaler 2 puff  2 puff Inhalation BID Clapacs, John T, MD   2 puff at 12/27/16 0838  . senna-docusate (Senokot-S) tablet 1 tablet  1 tablet Oral QHS Clapacs, John T, MD   1 tablet at 12/26/16 2140  . temazepam (RESTORIL) capsule 7.5 mg  7.5 mg Oral QHS Robinson, Patrick, MD   7.5 mg at 12/26/16 2140   Current Outpatient Medications  Medication Sig Dispense Refill  . cloZAPine (CLOZARIL) 100 MG tablet Take 1 tablet (100  mg total) by mouth daily. (Patient taking differently: Take 100 mg by mouth 2 (two) times daily. ) 30 tablet 0  . desmopressin (DDAVP) 0.2 MG tablet Take 1 tablet (0.2 mg total) by mouth at bedtime. 30 tablet 0  . divalproex (DEPAKOTE) 500 MG DR tablet Take 2 tablets (1,000 mg total) by mouth every 12 (twelve) hours. (Patient taking differently: Take 1,000 mg by mouth at bedtime. ) 120 tablet 0  . docusate sodium (COLACE) 100 MG capsule Take 2 capsules (200 mg total) by mouth 2 (two) times daily. 120 capsule 0  . ipratropium (ATROVENT) 0.03 % nasal spray Place 2 sprays into both nostrils at bedtime.    . metformin (FORTAMET) 500 MG (OSM) 24 hr tablet Take 500 mg by mouth daily with breakfast.    . metoprolol tartrate (LOPRESSOR) 25 MG tablet Take 12.5 mg by mouth 2 (two) times daily.    . polyethylene glycol (MIRALAX / GLYCOLAX) packet Take 17 g by mouth daily.    . senna (SENOKOT) 8.6 MG TABS tablet Take 2 tablets (17.2 mg total) by mouth at bedtime. 60 tablet 0  . simvastatin (ZOCOR) 40 MG tablet Take 1 tablet (40 mg total) by mouth daily at 6 PM. 30 tablet 0  . temazepam (RESTORIL) 7.5 MG capsule Take 1 capsule (7.5 mg total) by mouth at bedtime. (Patient taking differently: Take 15 mg by mouth at bedtime. ) 30 capsule 0  . chlorproMAZINE (THORAZINE) 100 MG tablet Take 1 tablet (100 mg total) by mouth 4 (four) times daily as needed (Agitation). (Patient not taking: Reported on 10/31/2016) 120 tablet 0  . cloZAPine (CLOZARIL) 200 MG tablet Take 2 tablets (400 mg total) by mouth at bedtime. (Patient not taking: Reported on 10/31/2016) 60 tablet 0  . FLUoxetine (PROZAC) 10 MG capsule Take 1 capsule (10 mg total) by mouth daily. (Patient not taking: Reported on 10/31/2016) 30 capsule 0  . mometasone-formoterol (DULERA) 200-5 MCG/ACT AERO Inhale 2 puffs into the lungs 2 (two) times daily. (Patient not taking: Reported on 10/31/2016) 2 Inhaler 0    Musculoskeletal: Strength & Muscle Tone: within  normal limits Gait & Station: normal Patient leans: N/A  Psychiatric Specialty Exam: Physical Exam  Nursing note and vitals reviewed. Constitutional: He appears well-developed and well-nourished.  HENT:  Head: Normocephalic and atraumatic.  Eyes: Conjunctivae are normal. Pupils are equal, round, and reactive to light.  Neck: Normal range of motion.  Cardiovascular: Regular rhythm and normal heart sounds.  Respiratory: Effort normal. No respiratory distress.  GI: Soft.  Musculoskeletal: Normal range of motion.  Neurological: He is alert.  Skin: Skin is warm and dry.  Psychiatric: His affect is blunt, labile and inappropriate. His speech is tangential. He is actively hallucinating. Thought content is paranoid   and delusional. Cognition and memory are impaired. He expresses impulsivity and inappropriate judgment. He expresses no homicidal and no suicidal ideation. He is inattentive.    Review of Systems  Constitutional: Negative.   HENT: Negative.   Eyes: Negative.   Respiratory: Negative.   Cardiovascular: Negative.   Gastrointestinal: Negative.   Musculoskeletal: Negative.   Skin: Negative.   Neurological: Negative.   Psychiatric/Behavioral: Positive for memory loss. Negative for depression, hallucinations, substance abuse and suicidal ideas. The patient is not nervous/anxious and does not have insomnia.     Blood pressure 112/64, pulse 96, temperature (!) 97.4 F (36.3 C), temperature source Oral, resp. rate 18, height 6' 3" (1.905 m), weight 113.4 kg (250 lb), SpO2 99 %.Body mass index is 31.25 kg/m.  General Appearance: Disheveled  Eye Contact:  Minimal  Speech:  Garbled and Slow  Volume:  Decreased  Mood:  Euthymic  Affect:  Constricted  Thought Process:  Disorganized  Orientation:  Negative  Thought Content:  Illogical, Ideas of Reference:   Paranoia and Paranoid Ideation  Suicidal Thoughts:  No  Homicidal Thoughts:  Yes.  without intent/plan  Memory:  Immediate;    Fair Recent;   Fair Remote;   Fair  Judgement:  Impaired  Insight:  Shallow  Psychomotor Activity:  Decreased  Concentration:  Concentration: Poor  Recall:  Poor  Fund of Knowledge:  Fair  Language:  Fair  Akathisia:  No  Handed:  Right  AIMS (if indicated):     Assets:  Financial Resources/Insurance  ADL's:  Impaired  Cognition:  Impaired,  Mild  Sleep:        Treatment Plan Summary: Daily contact with patient to assess and evaluate symptoms and progress in treatment, Medication management and Plan 56 year old man with schizophrenia who remains very psychotic.  Years of treatment to have shown that even with the most aggressive treatment he remains very impaired.  Consensus of everyone involved in treatment including [employer], his guardian, physicians and nursing here is all that the patient would be best served in a longer term hospital.  Meanwhile we are gradually increasing his clozapine back to his previous dosage at a rate that will, I hope, not cause undue side effects.  Continue monitoring daily support.  Disposition: Recommend psychiatric Inpatient admission when medically cleared. As noted previously we are aiming for admission to [place], MD 12/27/2016 12:07 PM

## 2016-12-27 NOTE — ED Notes (Signed)
MD made aware of patient agitated state.  MD gave ok for medications to be given early.  Pt. Agreed to take night time medications.  Pt. Also given meal tray and two cartons of milk upon request.

## 2016-12-27 NOTE — ED Notes (Signed)
IVC/  PENDING  PLACEMENT 

## 2016-12-27 NOTE — ED Notes (Signed)
Patient is cooperative, ate 100% of supper, Patient without any behavioral issues, Patient is safe, q 15 minute checks and camera surveillance in progress for safety.

## 2016-12-27 NOTE — ED Notes (Signed)
Patient ask for another pair of pants, states that He feels like his pants got bigger, nurse did give him another pair, Patient is without evidence of distress at this time, nurse will continue to monitor, q 15 minute checks and camera surveillance in progress for safety.

## 2016-12-27 NOTE — BH Assessment (Signed)
Completed [facility] and faxed to [employer] for authorization number.

## 2016-12-27 NOTE — BH Assessment (Addendum)
This Clinical research associatewriter completed verbal screening and [facility] papers [facility] per the request of Coralee Northina. It was confirmed it was received by [facility]. Referral is pending review at this time.

## 2016-12-27 NOTE — ED Provider Notes (Addendum)
-----------------------------------------   7:35 AM on 12/27/2016 -----------------------------------------    The patient had no acute events since last update.  Calm and cooperative at this time.  [facility]      Loleta RoseForbach, Aarib Pulido, MD 12/27/16 [phone]

## 2016-12-27 NOTE — BH Assessment (Signed)
Received authorization number from [employer] [id number]).  [facility].

## 2016-12-27 NOTE — BH Assessment (Deleted)
[facility], per Christiane HaJonathan, Intake Coordinator [phone]) Accepting MD:  Dr. Ellamae SiaKeshavpal Reddy Call report [phone] Patient can arrive anytime after 9 am to:  [address] [place]  [id number]

## 2016-12-27 NOTE — BH Assessment (Signed)
This Clinical research associatewriter [facility] and pt is currently on wait list.

## 2016-12-27 NOTE — ED Notes (Signed)
Patient  Is in the shower, he is calm and cooperative, garbled speech, Patient follows commands, no behavioral issues, he is safe, no si/hi and when ask if he heard voices he could not answer. Patient with q 15 minute checks and camera surveillance in progress for safety.

## 2016-12-27 NOTE — ED Notes (Signed)
This nurse tried to get vitals on patient.  Pt. Became upset and wanted this nurse to leave.  Pt. Refused to to have vitals taken.  Pt. Told to hit alarm bell when he was ready to have vitals taken.  Pt. Is now sitting in his chair in room while watching tv.

## 2016-12-27 NOTE — ED Notes (Signed)
Patient received PM snack. 

## 2016-12-28 DIAGNOSIS — F209 Schizophrenia, unspecified: Secondary | ICD-10-CM | POA: Diagnosis not present

## 2016-12-28 MED ORDER — IBUPROFEN 600 MG PO TABS
600.0000 mg | ORAL_TABLET | Freq: Four times a day (QID) | ORAL | Status: DC | PRN
  Administered 2016-12-28 – 2016-12-29 (×2): 600 mg via ORAL
  Filled 2016-12-28 (×2): qty 1

## 2016-12-28 MED ORDER — NICOTINE 21 MG/24HR TD PT24
21.0000 mg | MEDICATED_PATCH | Freq: Every day | TRANSDERMAL | Status: DC
  Administered 2016-12-28 – 2016-12-29 (×2): 21 mg via TRANSDERMAL
  Filled 2016-12-28 (×6): qty 1

## 2016-12-28 NOTE — ED Notes (Signed)
Patient took shower, he had snack, he is cooperative, will continue to monitor.

## 2016-12-28 NOTE — ED Notes (Signed)
Report was received from Rudy JewWendy L., RN; Pt. Verbalizes  complaints of being paranoid; with disorganized thoughts; talking to himself; distress of placement; denies S.I./Hi. Continue to monitor with 15 min. Monitoring.

## 2016-12-28 NOTE — ED Notes (Signed)
Patient refused the nicotine patch after asking for it, angry affect about not wanting it now, Patient is redirectable, q 15 minute checks and camera surveillance in progress for safety.

## 2016-12-28 NOTE — ED Notes (Signed)
Patient received PM snack. 

## 2016-12-28 NOTE — ED Notes (Signed)
Patient ate 100% of supper tray, no signs of distress.

## 2016-12-28 NOTE — ED Notes (Signed)
Pt. Woke up to use bathroom.  After going to bathroom.  Pt. Came to door requesting for someone to come in.  Upon entering room, pt. Requested something to drink.  Told pt. I would get him something to drink, but I would need to get vitals this morning.  Pt. Agreed and vitals were taken.  Pt. Was in a more pleasant mood in morning and even thanked nurse.  Pt. Also requested remote for tv and was given remote.  Pt. Laying in bed watching tv.

## 2016-12-28 NOTE — ED Notes (Signed)
Patient ate 100% of lunch, no signs of distress at this time.

## 2016-12-28 NOTE — ED Notes (Signed)
Patient states that his headache has been alleviated, and he is cooperative at this time, q 15 minute checks and camera surveillance in progress for safety.

## 2016-12-28 NOTE — ED Notes (Signed)
ED BHU PLACEMENT JUSTIFICATION Is the patient under IVC or is there intent for IVC: Yes.   Is the patient medically cleared: Yes.   Is there vacancy in the ED BHU: Yes.   Is the population mix appropriate for patient: Yes.   Is the patient awaiting placement in inpatient or outpatient setting: Yes.   Has the patient had a psychiatric consult: Yes.   Survey of unit performed for contraband, proper placement and condition of furniture, tampering with fixtures in bathroom, shower, and each patient room: Yes.   APPEARANCE/BEHAVIOR adequate rapport can be established NEURO ASSESSMENT Orientation: time, place and person Hallucinations: Yes.  Auditory Hallucinations and Visual Hallucinations Speech: Normal Gait: normal RESPIRATORY ASSESSMENT Normal expansion.  Clear to auscultation.  No rales, rhonchi, or wheezing. CARDIOVASCULAR ASSESSMENT regular rate and rhythm, S1, S2 normal, no murmur, click, rub or gallop GASTROINTESTINAL ASSESSMENT soft, nontender, BS WNL, no r/g EXTREMITIES no evidence of joint instability PLAN OF CARE Provide calm/safe environment. Vital signs assessed twice daily. ED BHU Assessment once each 12-hour shift. Collaborate with intake RN daily or as condition indicates. Assure the ED provider has rounded once each shift. Provide and encourage hygiene. Provide redirection as needed. Assess for escalating behavior; address immediately and inform ED provider.  Assess family dynamic and appropriateness for visitation as needed: Yes.   Educate the patient/family about BHU procedures/visitation: Yes.

## 2016-12-29 DIAGNOSIS — F209 Schizophrenia, unspecified: Secondary | ICD-10-CM | POA: Diagnosis not present

## 2016-12-29 NOTE — ED Provider Notes (Signed)
-----------------------------------------   6:46 AM on 12/29/2016 -----------------------------------------   Blood pressure 135/72, pulse (!) 102, temperature 98.1 F (36.7 C), temperature source Oral, resp. rate 18, height 6\' 3"  (1.905 m), weight 113.4 kg (250 lb), SpO2 98 %.  The patient had no acute events since last update.  Calm and cooperative at this time.  Disposition is pending Psychiatry/Behavioral Medicine team recommendations.     Merrily Brittleifenbark, Retha Bither, MD 12/29/16 [phone]

## 2016-12-29 NOTE — ED Notes (Signed)
Patient IVC, pending placement.

## 2016-12-29 NOTE — ED Notes (Signed)
Pt. Given meal tray and 2 white milks.

## 2016-12-29 NOTE — ED Notes (Signed)
Pt laying in bed, restless. Pt pushing call bell, turns it off repeatedly. Asked pt if he needed anything and he wanted food. Sandwich tray given.

## 2016-12-29 NOTE — ED Notes (Signed)
Pt IVC/ Pending Placement 

## 2016-12-29 NOTE — ED Notes (Signed)
Pt. Cooperative when giving vitals.  Pt. Mumbling about something to do with characters on tv screen.  Unable to discern what pt. Is saying.  Pt. Asked if he would like something to eat or drink, pt. Indicated he would like milk and meal tray.

## 2016-12-30 DIAGNOSIS — F209 Schizophrenia, unspecified: Secondary | ICD-10-CM | POA: Diagnosis not present

## 2016-12-30 NOTE — ED Notes (Signed)
Pt. Cooperative in the morning for vitals.  Patient denies SI/HI and A/VH.

## 2016-12-30 NOTE — ED Provider Notes (Signed)
-----------------------------------------   6:49 AM on 12/30/2016 -----------------------------------------   Blood pressure (!) 151/83, pulse (!) 108, temperature 97.9 F (36.6 C), temperature source Oral, resp. rate 16, height 6\' 3"  (1.905 m), weight 113.4 kg (250 lb), SpO2 97 %.  The patient had no acute events since last update.  Calm and cooperative at this time.  [facility]     Rebecka ApleyWebster, Allison P, MD 12/30/16 [phone]

## 2016-12-30 NOTE — BH Assessment (Signed)
Writer confirmed with [facility] (Ron & [phone]), patient remains on their Waitlist.

## 2016-12-30 NOTE — ED Notes (Signed)
IVC/Pending placement 

## 2016-12-30 NOTE — ED Notes (Signed)
Patient ate 100% of lunch, Patient is polite, thanked nurse for lunch, He does mumble, stutter and will talk to self often, Patient denies Si/hi. q 15 minute checks and camera surveillance in progress for safety.

## 2016-12-30 NOTE — ED Notes (Signed)
Pt. Up from his bed, pt. Ambulated to bathroom with no difficulty.  Pt. Returned to bed with no issues.

## 2016-12-30 NOTE — ED Notes (Signed)
Pt. Alert and oriented, warm and dry, in no distress. Pt. Denies SI, HI, and AVH. Pt. Encouraged to let nursing staff know of any concerns or needs. 

## 2016-12-31 DIAGNOSIS — F209 Schizophrenia, unspecified: Secondary | ICD-10-CM | POA: Diagnosis not present

## 2016-12-31 NOTE — ED Notes (Signed)

## 2016-12-31 NOTE — ED Provider Notes (Signed)
-----------------------------------------   6:29 AM on 12/31/2016 -----------------------------------------   Blood pressure 116/74, pulse (!) 110, temperature 99 F (37.2 C), temperature source Oral, resp. rate 16, height 6\' 3"  (1.905 m), weight 113.4 kg (250 lb), SpO2 99 %.  The patient had no acute events since last update.  Sleeping at this time.  Disposition is pending Psychiatry/Behavioral Medicine team recommendations.     Irean HongSung, Marcus Schwandt J, MD 12/31/16 [phone]

## 2016-12-31 NOTE — BH Assessment (Signed)
This Clinical research associatewriter called [facility] at [phone] and spoke with Vonna KotykJay. Vonna KotykJay stated client was still on the waiting list.

## 2016-12-31 NOTE — ED Notes (Signed)
Patient is calm and cooperative today.Asked for food in between meals.Denies SI,HI and AVH.Encouraged for shower.Safety maintained in the unit.

## 2016-12-31 NOTE — ED Notes (Signed)
IVC/ [facility] Waitlist 

## 2016-12-31 NOTE — ED Notes (Addendum)
Pt. Alert and oriented, warm and dry, in no distress. Pt. Denies SI, HI, and AVH. Pt appears to be responding to internal stimuli but when asked patient denies. Pt is talking to self. Pt. Encouraged to let nursing staff know of any concerns or needs.

## 2017-01-01 DIAGNOSIS — F209 Schizophrenia, unspecified: Secondary | ICD-10-CM | POA: Diagnosis not present

## 2017-01-01 NOTE — ED Notes (Signed)
Pt given snack and milk. Pt remains calm and cooperative. Maintained on 15 minute checks and observation by security camera for safety.

## 2017-01-01 NOTE — BH Assessment (Signed)
Writer confirmed with [facility] (Sonya-[phone]), patient remains on their Waitlist.

## 2017-01-01 NOTE — ED Notes (Signed)
2nd  Set  Of  IVC  PAPERS  DONE  PT  ON  [facility]  WAITLIST

## 2017-01-01 NOTE — ED Notes (Signed)
Pt pacing, but not agitation. Maintained on 15 minute checks and observation by security camera for safety.

## 2017-01-01 NOTE — ED Provider Notes (Signed)
-----------------------------------------   2:36 AM on 01/01/2017 -----------------------------------------   Blood pressure 119/83, pulse (!) 108, temperature 98.1 F (36.7 C), temperature source Oral, resp. rate 16, height 6\' 3"  (1.905 m), weight 113.4 kg (250 lb), SpO2 100 %.  The patient had no acute events since last update.  Calm and cooperative at this time.  Disposition is pending Psychiatry/Behavioral Medicine team recommendations.     Merrily Brittleifenbark, Nyasiah Moffet, MD 01/01/17 [phone]

## 2017-01-01 NOTE — ED Notes (Signed)
Patient asleep in room. No noted distress or abnormal behavior. Will continue 15 minute checks and observation by security cameras for safety. 

## 2017-01-01 NOTE — ED Notes (Signed)
Pt pacing, but denies any needs at this time. Pt does not seem agitated. Maintained on 15 minute checks and observation by security camera for safety.

## 2017-01-01 NOTE — ED Notes (Signed)
Pt. Alert and oriented, warm and dry, in no distress. Pt. Denies SI, HI, and AVH. Pt. Encouraged to let nursing staff know of any concerns or needs. 

## 2017-01-01 NOTE — ED Notes (Signed)
Patient resting quietly in room. No noted distress or abnormal behaviors noted. Will continue 15 minute checks and observation by security camera for safety. 

## 2017-01-01 NOTE — ED Notes (Signed)

## 2017-01-02 DIAGNOSIS — F203 Undifferentiated schizophrenia: Secondary | ICD-10-CM | POA: Diagnosis not present

## 2017-01-02 DIAGNOSIS — F209 Schizophrenia, unspecified: Secondary | ICD-10-CM | POA: Diagnosis not present

## 2017-01-02 DIAGNOSIS — S58119A Complete traumatic amputation at level between elbow and wrist, unspecified arm, initial encounter: Secondary | ICD-10-CM

## 2017-01-02 LAB — CBC WITH DIFFERENTIAL/PLATELET
Basophils Absolute: 0.1 10*3/uL (ref 0–0.1)
Basophils Relative: 1 %
EOS ABS: 0 10*3/uL (ref 0–0.7)
Eosinophils Relative: 0 %
HCT: 39.1 % — ABNORMAL LOW (ref 40.0–52.0)
HEMOGLOBIN: 13.1 g/dL (ref 13.0–18.0)
LYMPHS ABS: 1.5 10*3/uL (ref 1.0–3.6)
Lymphocytes Relative: 23 %
MCH: 30.4 pg (ref 26.0–34.0)
MCHC: 33.5 g/dL (ref 32.0–36.0)
MCV: 90.9 fL (ref 80.0–100.0)
MONO ABS: 0.7 10*3/uL (ref 0.2–1.0)
MONOS PCT: 11 %
Neutro Abs: 4.1 10*3/uL (ref 1.4–6.5)
Neutrophils Relative %: 65 %
Platelets: 231 10*3/uL (ref 150–440)
RBC: 4.3 MIL/uL — ABNORMAL LOW (ref 4.40–5.90)
RDW: 12.9 % (ref 11.5–14.5)
WBC: 6.3 10*3/uL (ref 3.8–10.6)
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
## 2017-01-02 NOTE — BH Assessment (Signed)
Writer [facility] ([phone]) requesting; IVC, MAR, Vitals & MD Notes. Information faxed and confirmed it was received (Charles-[phone]).  Paperwork pending review and will call back with update.

## 2017-01-02 NOTE — ED Notes (Signed)
Patient took a shower and bed linen changed and room cleaned.

## 2017-01-02 NOTE — ED Notes (Signed)
Pt. Requested some additional crackers.  Pt. Was given additional crackers and drink.

## 2017-01-02 NOTE — BH Assessment (Signed)
[facility] accepted [facility].  Accepting personnel Littie Deedsharles Binanayrn. "We don't have accepting physician. Use my name for the EMTALA Form." Call report to [phone].  Representative was [employer] Binanaryn.  ER Staff is aware of it Rivka Barbara(Glenda, ER Sect. & Britta MccreedyBarbara, Patient's Nurse)     Writer called and left a HIPPA Compliant message with Legal Guardian ([employer]-[phone]), requesting a return phone call.  If patient cannot transport tonight, he bed will remain available for 24 hours.

## 2017-01-02 NOTE — ED Notes (Signed)
Patient alert and verbal. Patient engage minimal in conversation with this Clinical research associatewriter. Patient calm and cooperative. Patient provided support and encouragement. Q 15 minute checks in progress and patient remains safe on unit. Patient denies SI/HI and A/V hallucinations. Monitoring continues.

## 2017-01-02 NOTE — BH Assessment (Signed)
Writer called [facility] Leonette Most(Charles & [phone]) and informed them the patient will be transporting tomorrow (01/03/2017) due to no available officer with the [employer].

## 2017-01-02 NOTE — ED Notes (Signed)
IVC/Pending [facility] review possible acceptance

## 2017-01-02 NOTE — ED Notes (Signed)
Pt. Requested sheet to be changed, pt. States spilling some food on sheet.  Sheet changed with help from patient.

## 2017-01-02 NOTE — ED Provider Notes (Signed)
-----------------------------------------   6:02 AM on 01/02/2017 -----------------------------------------   Blood pressure 127/83, pulse (!) 113, temperature 98.7 F (37.1 C), temperature source Oral, resp. rate 20, height 6\' 3"  (1.905 m), weight 113.4 kg (250 lb), SpO2 100 %.  The patient had no acute events since last update.  Calm and cooperative at this time.  Disposition is pending Psychiatry/Behavioral Medicine team recommendations.     Merrily Brittleifenbark, Josearmando Kuhnert, MD 01/02/17 [phone]

## 2017-01-03 DIAGNOSIS — F209 Schizophrenia, unspecified: Secondary | ICD-10-CM | POA: Diagnosis not present

## 2017-01-03 NOTE — ED Notes (Signed)
[facility] all belongings given to Patient, [facility]. Patient without any behavorial issues noted.

## 2017-01-03 NOTE — Progress Notes (Signed)
MEDICATION RELATED CONSULT NOTE  Pharmacy Consult for Clozapine monitoring Indication: Schizophrenia  No Known Allergies  Patient Measurements: Height: 6\' 3"  (190.5 cm) Weight: 250 lb (113.4 kg) IBW/kg (Calculated) : 84.5   Labs: Recent Labs    01/02/17 0700  WBC 6.3  HGB 13.1  HCT 39.1*  PLT 231   Estimated Creatinine Clearance: 119.4 mL/min (by C-G formula based on SCr of 0.95 mg/dL).   Medical History: Past Medical History:  Diagnosis Date  . Hypertension   . Schizophrenia (HCC)     12/26/16 ANC 3600.  01/02/17  ANC 4100   Labs submitted to Clozapine REMS registry. Continue to monitor weekly while inpatient.  Next labs due 01/09/17.   Hillery Bhalla A, RPH 01/03/2017,7:41 AM

## 2017-01-03 NOTE — ED Notes (Signed)
Ivc/ transport to [facility] today when sheriff available

## 2017-01-03 NOTE — ED Provider Notes (Signed)
No events overnight, [facility]. Vitals:   01/02/17 1918 01/03/17 0656  BP: 128/78 105/60  Pulse: 98 87  Resp: 16 18  Temp: 98.3 F (36.8 C) 98.3 F (36.8 C)  SpO2: 98% 100%      Emily FilbertWilliams, Jonathan E, MD 01/03/17 0830

## 2017-01-03 NOTE — ED Notes (Signed)
Patient is pleasant, no signs of distress, or behaviors noted, Patient is transporting to [facility] with law enforcement, Patient is getting dressed.

## 2018-11-05 IMAGING — CT CT HEAD W/O CM
5 of 12 series · 17 of 47 positions shown, 18 images · non-contrast
Comparison: None.

CLINICAL DATA: Unresponsive patient, hypotensive, altered level of
consciousness.

EXAM:
CT HEAD WITHOUT CONTRAST
TECHNIQUE: Contiguous axial images were obtained from the base of the skull
through the vertex without intravenous contrast.

[Series 3: head bone · axial · 0.49mm/px · z∈[-118,+2]mm · 6 of 85 slices shown (1 of 2)]
[im 13/85  bone]
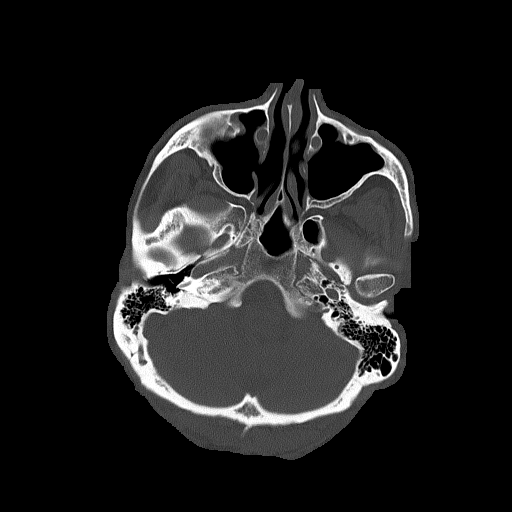
[im 25/85  bone]
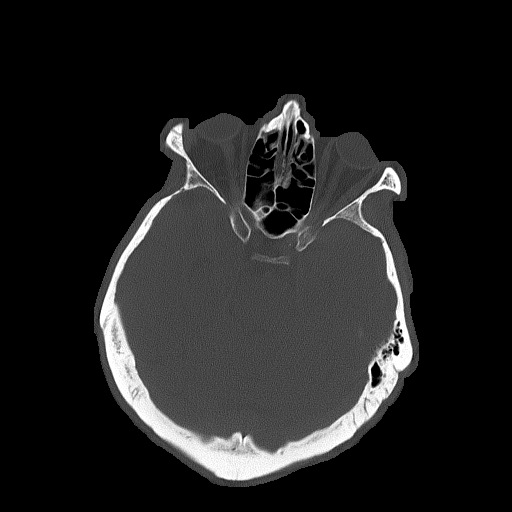
[im 37/85  bone]
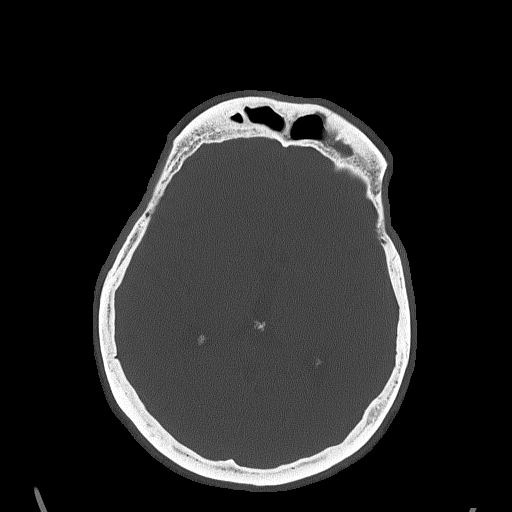
[im 49/85  bone]
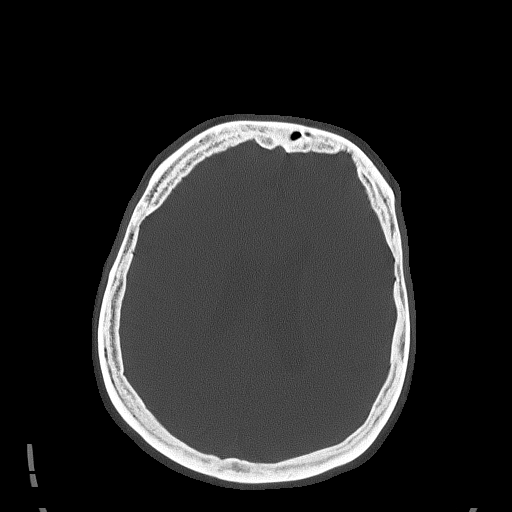
[im 61/85  bone]
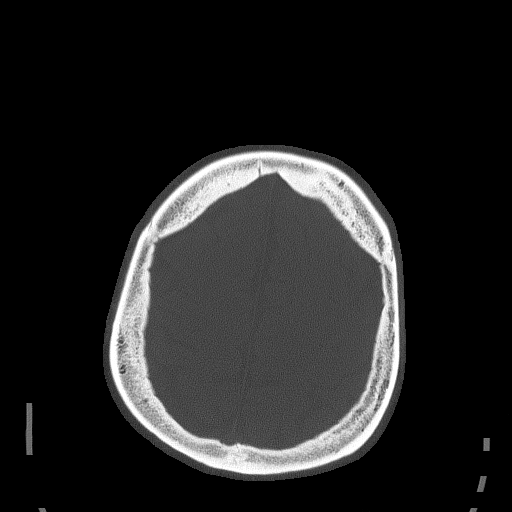
[im 73/85  bone]
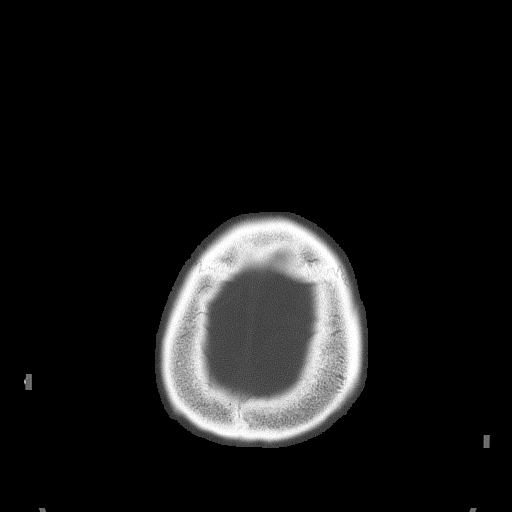

[Series 4: head wo · axial · 0.49mm/px · z∈[-142,+23]mm · 3 of 34 slices shown, 4 images]
[im 1/34  brain]
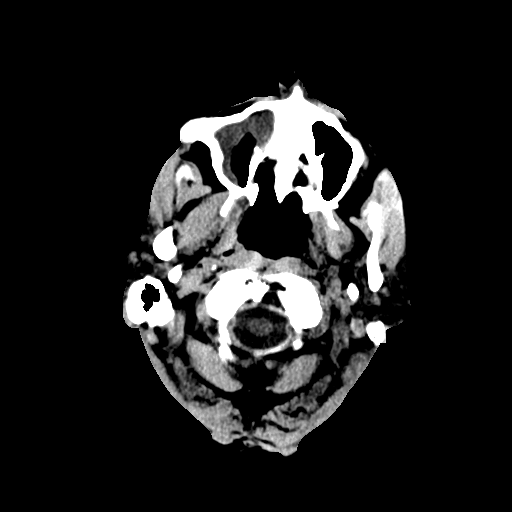
[im 1/34  bone]
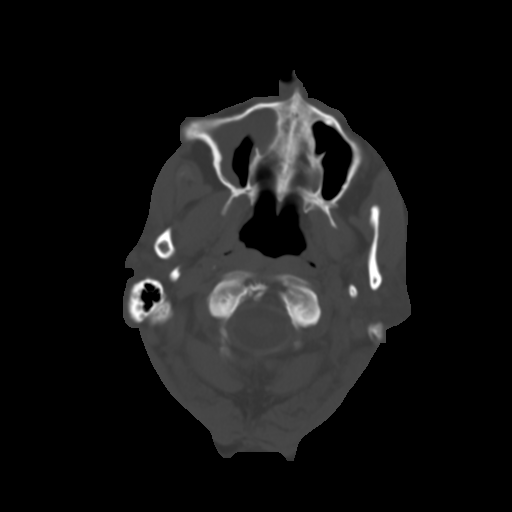
[im 17/34  brain]
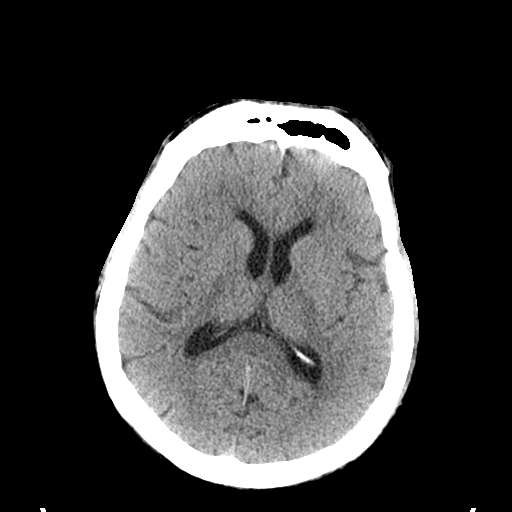
[im 34/34  brain]
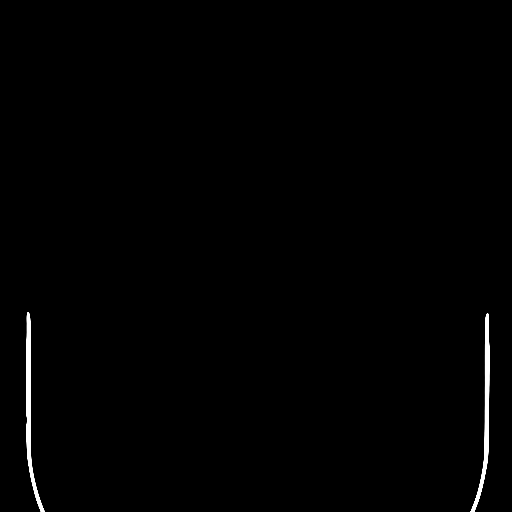

[Series 6: head bone · axial · 0.37mm/px · z∈[-62,-12]mm · 3 of 70 slices shown (2 of 2)]
[im 14/70  bone]
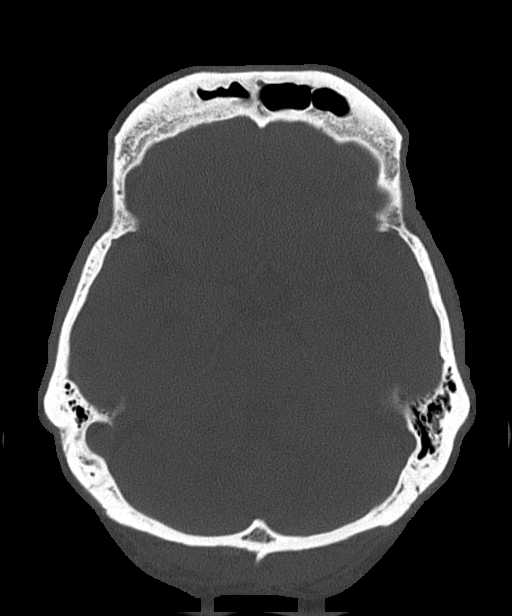
[im 28/70  bone]
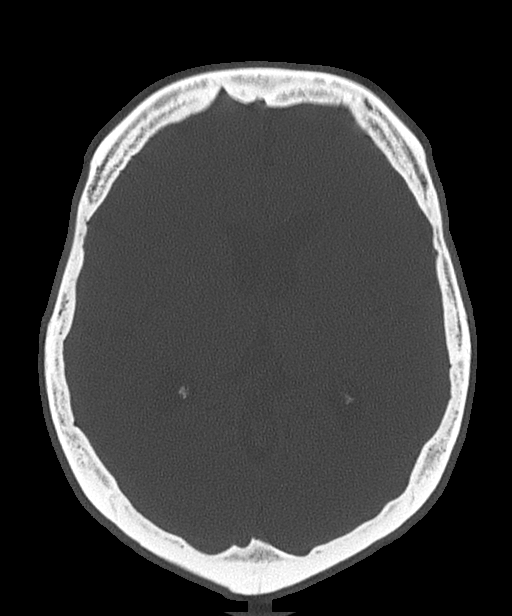
[im 42/70  bone]
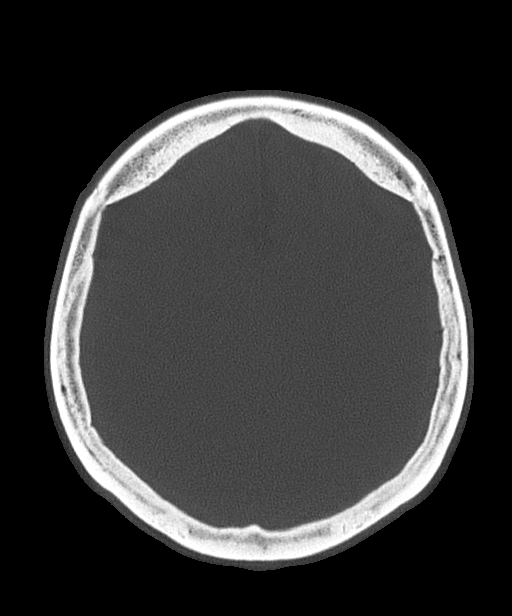

[Series 7: coronal soft tissue · coronal · 0.33mm/px · 3 of 63 slices shown]
[im 15/63  brain]
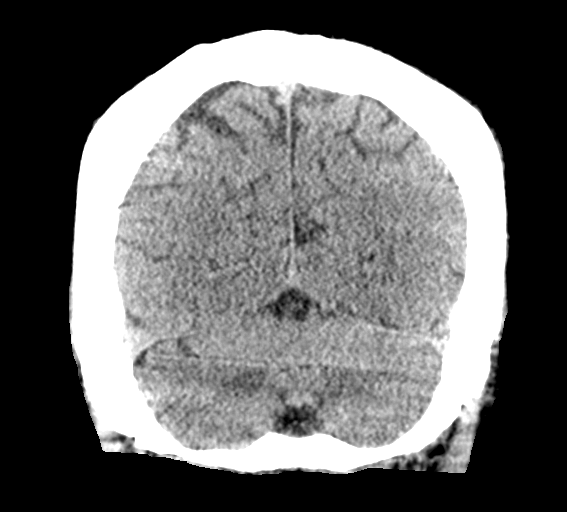
[im 27/63  brain]
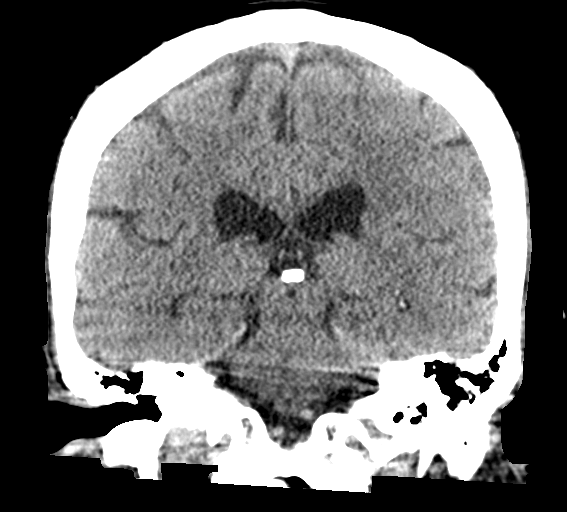
[im 39/63  brain]
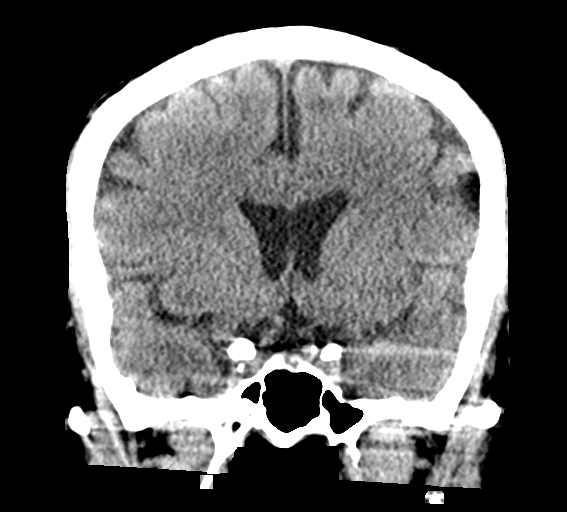

[Series 14: sagittal soft tissue · sagittal · 0.32mm/px · 2 of 55 slices shown]
[im 19/55  brain]
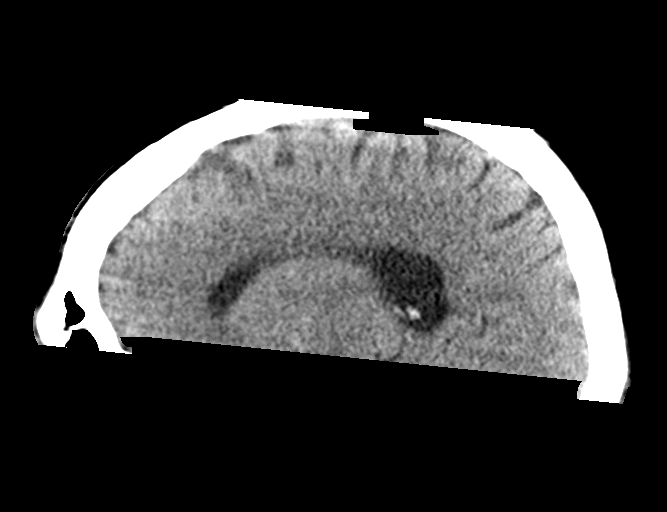
[im 37/55  brain]
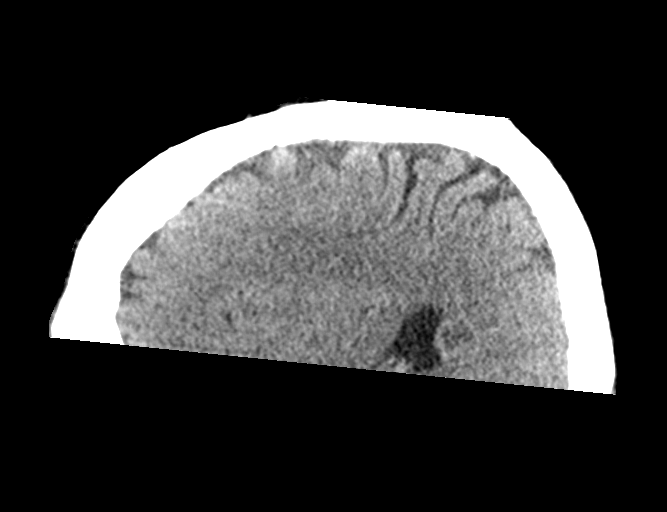

[17 of 47 positions shown; findings below may reference images not displayed]

FINDINGS: Brain: The brainstem, cerebellum, cerebral peduncles, thalami, basal
ganglia, basilar cisterns, and ventricular system appear within
normal limits. No intracranial hemorrhage, mass lesion, or acute
CVA.

Vascular: Unremarkable

Skull: Unremarkable

Sinuses/Orbits: Mild chronic right frontal sinusitis and mild
chronic ethmoid sinusitis. Chronic bilateral maxillary sinusitis.
New

Other: No supplemental non-categorized findings.
IMPRESSION: 1. Chronic paranasal sinusitis. Otherwise normal CT appearance of
the head.
